# Patient Record
Sex: Female | Born: 1935 | Race: White | Hispanic: No | Smoking: Former smoker
Health system: Southern US, Community
[De-identification: ages and names within clinical notes are randomized; demographics above are authoritative.]

## PROBLEM LIST (undated history)

## (undated) DIAGNOSIS — T8859XA Other complications of anesthesia, initial encounter: Secondary | ICD-10-CM

## (undated) DIAGNOSIS — C3490 Malignant neoplasm of unspecified part of unspecified bronchus or lung: Secondary | ICD-10-CM

## (undated) DIAGNOSIS — M5432 Sciatica, left side: Secondary | ICD-10-CM

## (undated) DIAGNOSIS — C801 Malignant (primary) neoplasm, unspecified: Secondary | ICD-10-CM

## (undated) DIAGNOSIS — R0600 Dyspnea, unspecified: Secondary | ICD-10-CM

## (undated) DIAGNOSIS — I10 Essential (primary) hypertension: Secondary | ICD-10-CM

## (undated) DIAGNOSIS — T4145XA Adverse effect of unspecified anesthetic, initial encounter: Secondary | ICD-10-CM

## (undated) DIAGNOSIS — M48061 Spinal stenosis, lumbar region without neurogenic claudication: Secondary | ICD-10-CM

## (undated) DIAGNOSIS — M810 Age-related osteoporosis without current pathological fracture: Secondary | ICD-10-CM

## (undated) DIAGNOSIS — F329 Major depressive disorder, single episode, unspecified: Secondary | ICD-10-CM

## (undated) DIAGNOSIS — F32A Depression, unspecified: Secondary | ICD-10-CM

## (undated) DIAGNOSIS — D473 Essential (hemorrhagic) thrombocythemia: Secondary | ICD-10-CM

## (undated) DIAGNOSIS — J449 Chronic obstructive pulmonary disease, unspecified: Secondary | ICD-10-CM

## (undated) DIAGNOSIS — E538 Deficiency of other specified B group vitamins: Secondary | ICD-10-CM

## (undated) HISTORY — DX: Chronic obstructive pulmonary disease, unspecified: J44.9

## (undated) HISTORY — DX: Malignant (primary) neoplasm, unspecified: C80.1

## (undated) HISTORY — DX: Essential (hemorrhagic) thrombocythemia: D47.3

## (undated) HISTORY — DX: Spinal stenosis, lumbar region without neurogenic claudication: M48.061

## (undated) HISTORY — DX: Sciatica, left side: M54.32

## (undated) HISTORY — PX: TONSILLECTOMY: SUR1361

## (undated) HISTORY — DX: Major depressive disorder, single episode, unspecified: F32.9

## (undated) HISTORY — PX: STAPEDECTOMY: SHX2435

## (undated) HISTORY — PX: CATARACT EXTRACTION, BILATERAL: SHX1313

## (undated) HISTORY — DX: Depression, unspecified: F32.A

## (undated) HISTORY — PX: APPENDECTOMY: SHX54

## (undated) HISTORY — DX: Age-related osteoporosis without current pathological fracture: M81.0

## (undated) HISTORY — DX: Essential (primary) hypertension: I10

## (undated) HISTORY — DX: Deficiency of other specified B group vitamins: E53.8

---

## 1997-09-05 ENCOUNTER — Ambulatory Visit (HOSPITAL_COMMUNITY): Admission: RE | Admit: 1997-09-05 | Discharge: 1997-09-05 | Payer: Self-pay | Admitting: Ophthalmology

## 1998-03-19 ENCOUNTER — Ambulatory Visit (HOSPITAL_COMMUNITY): Admission: RE | Admit: 1998-03-19 | Discharge: 1998-03-19 | Payer: Self-pay | Admitting: Urology

## 1998-07-25 ENCOUNTER — Other Ambulatory Visit: Admission: RE | Admit: 1998-07-25 | Discharge: 1998-07-25 | Payer: Self-pay | Admitting: Family Medicine

## 1999-01-08 ENCOUNTER — Encounter: Payer: Self-pay | Admitting: Family Medicine

## 1999-01-08 ENCOUNTER — Encounter: Admission: RE | Admit: 1999-01-08 | Discharge: 1999-01-08 | Payer: Self-pay | Admitting: Family Medicine

## 2001-01-12 ENCOUNTER — Encounter: Payer: Self-pay | Admitting: Family Medicine

## 2001-01-12 ENCOUNTER — Encounter: Admission: RE | Admit: 2001-01-12 | Discharge: 2001-01-12 | Payer: Self-pay | Admitting: Family Medicine

## 2002-01-10 ENCOUNTER — Encounter: Admission: RE | Admit: 2002-01-10 | Discharge: 2002-01-10 | Payer: Self-pay | Admitting: Family Medicine

## 2002-01-10 ENCOUNTER — Encounter: Payer: Self-pay | Admitting: Family Medicine

## 2002-01-18 ENCOUNTER — Encounter: Payer: Self-pay | Admitting: Family Medicine

## 2002-01-18 ENCOUNTER — Encounter: Admission: RE | Admit: 2002-01-18 | Discharge: 2002-01-18 | Payer: Self-pay | Admitting: Family Medicine

## 2002-03-25 ENCOUNTER — Other Ambulatory Visit: Admission: RE | Admit: 2002-03-25 | Discharge: 2002-03-25 | Payer: Self-pay | Admitting: Family Medicine

## 2002-10-10 ENCOUNTER — Encounter: Admission: RE | Admit: 2002-10-10 | Discharge: 2002-10-10 | Payer: Self-pay | Admitting: Family Medicine

## 2002-10-10 ENCOUNTER — Encounter: Payer: Self-pay | Admitting: Family Medicine

## 2002-11-07 ENCOUNTER — Ambulatory Visit (HOSPITAL_COMMUNITY): Admission: RE | Admit: 2002-11-07 | Discharge: 2002-11-07 | Payer: Self-pay | Admitting: Gastroenterology

## 2002-11-11 ENCOUNTER — Ambulatory Visit (HOSPITAL_COMMUNITY): Admission: RE | Admit: 2002-11-11 | Discharge: 2002-11-11 | Payer: Self-pay | Admitting: Gastroenterology

## 2002-11-11 ENCOUNTER — Encounter: Payer: Self-pay | Admitting: Gastroenterology

## 2003-01-23 ENCOUNTER — Encounter: Admission: RE | Admit: 2003-01-23 | Discharge: 2003-01-23 | Payer: Self-pay | Admitting: Family Medicine

## 2003-04-17 ENCOUNTER — Other Ambulatory Visit: Admission: RE | Admit: 2003-04-17 | Discharge: 2003-04-17 | Payer: Self-pay | Admitting: Family Medicine

## 2003-06-01 ENCOUNTER — Other Ambulatory Visit: Admission: RE | Admit: 2003-06-01 | Discharge: 2003-06-01 | Payer: Self-pay | Admitting: Family Medicine

## 2004-05-27 ENCOUNTER — Other Ambulatory Visit: Admission: RE | Admit: 2004-05-27 | Discharge: 2004-05-27 | Payer: Self-pay | Admitting: Family Medicine

## 2006-05-13 ENCOUNTER — Encounter: Admission: RE | Admit: 2006-05-13 | Discharge: 2006-05-13 | Payer: Self-pay | Admitting: Family Medicine

## 2006-06-17 ENCOUNTER — Encounter: Admission: RE | Admit: 2006-06-17 | Discharge: 2006-06-17 | Payer: Self-pay | Admitting: Family Medicine

## 2006-06-17 ENCOUNTER — Other Ambulatory Visit: Admission: RE | Admit: 2006-06-17 | Discharge: 2006-06-17 | Payer: Self-pay | Admitting: Family Medicine

## 2006-06-29 ENCOUNTER — Ambulatory Visit (HOSPITAL_COMMUNITY): Admission: RE | Admit: 2006-06-29 | Discharge: 2006-06-29 | Payer: Self-pay | Admitting: Family Medicine

## 2009-02-14 ENCOUNTER — Encounter: Admission: RE | Admit: 2009-02-14 | Discharge: 2009-02-14 | Payer: Self-pay | Admitting: Family Medicine

## 2009-06-05 ENCOUNTER — Encounter: Admission: RE | Admit: 2009-06-05 | Discharge: 2009-06-05 | Payer: Self-pay | Admitting: Family Medicine

## 2010-06-14 NOTE — Op Note (Signed)
   NAME:  Kathy Pierce, Kathy Pierce                           ACCOUNT NO.:  [id number]   MEDICAL RECORD NO.:  [id number]                   PATIENT TYPE:  AMB   LOCATION:  ENDO                                 FACILITY:  [facility]   PHYSICIAN:  John C. Madilyn Fireman, M.D.                 DATE OF BIRTH:  28-Jul-1925   DATE OF PROCEDURE:  11/07/2002  DATE OF DISCHARGE:                                 OPERATIVE REPORT   PROCEDURE PERFORMED:  Esophagogastroduodenoscopy.   ENDOSCOPIST:  Barrie Folk, M.D.   INDICATIONS FOR PROCEDURE:  Right upper quadrant abdominal pain in a patient  with a family history of stomach cancer in a first degree relative.   DESCRIPTION OF PROCEDURE:  The patient was placed in the left lateral  decubitus position and placed on the pulse monitor with continuous low-flow  oxygen delivered by nasal cannula.  She was sedated with 40 mcg IV fentanyl  and 4 mg of IV Versed.  The Olympus video colonoscope was  advanced under  direct vision into the oropharynx and esophagus.  The esophagus was straight  and of normal caliber with the squamocolumnar line at 38 cm.  There was no  visible hiatal hernia, ring, stricture or other abnormality of the  gastroesophageal junction.  The stomach was entered and a small amount of  liquid secretions was suctioned from the fundus.  A retroflex view of the  cardia was unremarkable.  The fundus, body, antrum and pylorus all appeared  normal.  The duodenum was entered and both the bulb and second portion were  well inspected and appeared to be within normal limits.  The scope was then  withdrawn back into the stomach a CLO test obtained.  The scope was then  withdrawn and the patient was returned to the recovery room in stable  condition.  The patient tolerated the procedure well.  There were no  immediate complications.   IMPRESSION:  Normal endoscopy.   PLAN:  Await CLO test and will proceed with colonoscopy.     John C. Madilyn Fireman, M.D.    [facility]/MEDQ  D:  11/07/2002  T:  11/07/2002  Job:  [id number]   cc:   Duncan Dull, M.D.  [address]  [place]  [place] [address]  Fax: [phone]

## 2010-06-14 NOTE — Op Note (Signed)
   NAME:  Kathy Pierce, Kathy Pierce                           ACCOUNT NO.:  [id number]   MEDICAL RECORD NO.:  [id number]                   PATIENT TYPE:  AMB   LOCATION:  ENDO                                 FACILITY:  [facility]   PHYSICIAN:  John C. Madilyn Fireman, M.D.                 DATE OF BIRTH:  11/07/1925   DATE OF PROCEDURE:  11/07/2002  DATE OF DISCHARGE:                                 OPERATIVE REPORT   PROCEDURE:  Colonoscopy.   INDICATIONS FOR PROCEDURE:  Colon cancer screening in a patient who has also  under evaluation for right upper quadrant abdominal pain.   DESCRIPTION OF PROCEDURE:  The patient was placed in the left lateral  decubitus position and placed on the pulse monitor with continuous low-flow  oxygen delivered by nasal cannula.  She was sedated with 30 mcg IV fentanyl,  3 mg IV Versed in addition to the medicine given for the previous EGD.  The  Olympus video colonoscope was inserted into the rectum and advanced to the  cecum, confirmed by transillumination of McBurney's point and visualization  of the ileocecal valve and appendiceal orifice.  Prep was excellent.  The  cecum, ascending, transverse, descending, and sigmoid colon all appeared  normal with no masses, polyps, diverticula, or other mucosal abnormalities.  The rectum likewise appeared normal and retroflexed view of the anus  revealed no obvious internal hemorrhoids.  The colonoscope was then  withdrawn and the patient returned to the recovery room in stable condition.  She tolerated the procedure well and there were no immediate complications.   IMPRESSION:  Normal colonoscopy.   PLAN:  Will consider gallbladder ultrasound or possibly CT scan of the  abdomen and pelvis.                                                John C. Madilyn Fireman, M.D.    J[facility]/MEDQ  D:  11/07/2002  T:  11/07/2002  Job:  [id number]   cc:   Duncan Dull, M.D.  [address]  [place]  [place] [address]  Fax: [phone]

## 2011-02-26 ENCOUNTER — Other Ambulatory Visit: Payer: Self-pay | Admitting: Family Medicine

## 2011-03-24 ENCOUNTER — Ambulatory Visit
Admission: RE | Admit: 2011-03-24 | Discharge: 2011-03-24 | Disposition: A | Discharge status: Home / Self Care | Payer: Self-pay | Source: Ambulatory Visit | Attending: Family Medicine | Admitting: Family Medicine

## 2011-11-05 ENCOUNTER — Other Ambulatory Visit: Payer: Self-pay | Admitting: Family Medicine

## 2011-11-05 ENCOUNTER — Ambulatory Visit
Admission: RE | Admit: 2011-11-05 | Discharge: 2011-11-05 | Disposition: A | Discharge status: Home / Self Care | Payer: Medicare Other | Source: Ambulatory Visit | Attending: Family Medicine | Admitting: Family Medicine

## 2011-11-05 DIAGNOSIS — R2689 Other abnormalities of gait and mobility: Secondary | ICD-10-CM

## 2011-11-19 ENCOUNTER — Other Ambulatory Visit: Payer: Self-pay | Admitting: Neurology

## 2011-11-19 DIAGNOSIS — M48061 Spinal stenosis, lumbar region without neurogenic claudication: Secondary | ICD-10-CM

## 2011-11-24 ENCOUNTER — Ambulatory Visit
Admission: RE | Admit: 2011-11-24 | Discharge: 2011-11-24 | Disposition: A | Discharge status: Home / Self Care | Payer: Medicare Other | Source: Ambulatory Visit | Attending: Neurology | Admitting: Neurology

## 2011-11-24 DIAGNOSIS — M48061 Spinal stenosis, lumbar region without neurogenic claudication: Secondary | ICD-10-CM

## 2011-11-24 MED ORDER — IOHEXOL 180 MG/ML  SOLN
1.0000 mL | Freq: Once | INTRAMUSCULAR | Status: AC | PRN
  Administered 2011-11-24: 1 mL via EPIDURAL

## 2011-11-24 MED ORDER — METHYLPREDNISOLONE ACETATE 40 MG/ML INJ SUSP (RADIOLOG
120.0000 mg | Freq: Once | INTRAMUSCULAR | Status: AC
  Administered 2011-11-24: 120 mg via EPIDURAL

## 2011-12-09 ENCOUNTER — Other Ambulatory Visit: Payer: Self-pay | Admitting: Neurology

## 2011-12-09 DIAGNOSIS — M48061 Spinal stenosis, lumbar region without neurogenic claudication: Secondary | ICD-10-CM

## 2011-12-10 ENCOUNTER — Ambulatory Visit
Admission: RE | Admit: 2011-12-10 | Discharge: 2011-12-10 | Disposition: A | Discharge status: Home / Self Care | Payer: Medicare Other | Source: Ambulatory Visit | Attending: Neurology | Admitting: Neurology

## 2011-12-10 DIAGNOSIS — M48061 Spinal stenosis, lumbar region without neurogenic claudication: Secondary | ICD-10-CM

## 2011-12-10 MED ORDER — METHYLPREDNISOLONE ACETATE 40 MG/ML INJ SUSP (RADIOLOG
120.0000 mg | Freq: Once | INTRAMUSCULAR | Status: AC
  Administered 2011-12-10: 120 mg via EPIDURAL

## 2011-12-10 MED ORDER — IOHEXOL 180 MG/ML  SOLN
1.0000 mL | Freq: Once | INTRAMUSCULAR | Status: AC | PRN
  Administered 2011-12-10: 1 mL via EPIDURAL

## 2012-04-06 ENCOUNTER — Encounter: Payer: Self-pay | Admitting: Cardiovascular Disease

## 2012-05-21 ENCOUNTER — Encounter: Payer: Self-pay | Admitting: Cardiovascular Disease

## 2012-06-16 ENCOUNTER — Encounter: Payer: Self-pay | Admitting: Neurology

## 2012-06-16 ENCOUNTER — Ambulatory Visit (INDEPENDENT_AMBULATORY_CARE_PROVIDER_SITE_OTHER): Payer: Medicare Other | Admitting: Neurology

## 2012-06-16 VITALS — BP 134/68 | HR 66 | Ht 63.75 in | Wt 110.0 lb

## 2012-06-16 DIAGNOSIS — M5432 Sciatica, left side: Secondary | ICD-10-CM

## 2012-06-16 DIAGNOSIS — M5430 Sciatica, unspecified side: Secondary | ICD-10-CM

## 2012-06-16 DIAGNOSIS — M48061 Spinal stenosis, lumbar region without neurogenic claudication: Secondary | ICD-10-CM

## 2012-06-16 HISTORY — DX: Spinal stenosis, lumbar region without neurogenic claudication: M48.061

## 2012-06-16 HISTORY — DX: Sciatica, left side: M54.32

## 2012-06-16 NOTE — Progress Notes (Signed)
Reason for visit: Leg pain  Kathy Pierce is an 79 y.o. female  History of present illness:  Kathy Pierce is an 79 year old right-handed white female with a history of lumbosacral spinal stenosis at the L4-5 level also associated with some mild gait instability. The patient has done quite well since she was seen through this office in the fall of 2013. Within the last several weeks, the patient developed significant pain into the left hip and down the left leg to the foot. The patient has difficulty with sitting. If the patient walks too long, she will have increased pain, but the patient indicates that she hurts all of the time. The patient is now using a cane for ambulation in the morning when the pain is particularly bad. The patient has been given a course of prednisone without much benefit. The patient is sent to this office for an evaluation  Past Medical History  Diagnosis Date  . Hypertension   . Sciatica of left side 06/16/2012  . Spinal stenosis of lumbar region 06/16/2012  . COPD (chronic obstructive pulmonary disease)   . Osteoporosis   . Depression   . Vitamin B12 deficiency   . Cancer     Squamous cell carcinoma, skin, basal cell carcinoma  . Essential thrombocythemia     Past Surgical History  Procedure Laterality Date  . Tonsillectomy    . Appendectomy    . Cataract extraction, bilateral Bilateral   . Stapedectomy Right     Family History  Problem Relation Age of Onset  . Heart attack Father   . Cancer Mother     Social history:  reports that she quit smoking about 35 years ago. She does not have any smokeless tobacco history on file. She reports that she does not drink alcohol. Her drug history is not on file.  Allergies:  Allergies  Allergen Reactions  . Codeine Nausea And Vomiting  . Erythromycin Nausea And Vomiting  . Penicillins Rash  . Sulfa Drugs Cross Reactors Nausea And Vomiting    Medications:  Current Outpatient Prescriptions on File Prior to  Visit  Medication Sig Dispense Refill  . aspirin 81 MG tablet Take 81 mg by mouth daily.      . calcium carbonate (OS-CAL - DOSED IN MG OF ELEMENTAL CALCIUM) 1250 MG tablet Take 1 tablet by mouth daily.      . Cyanocobalamin (VITAMIN B 12 PO) Take by mouth.      . hydroxyurea (HYDREA) 500 MG capsule Take 500 mg by mouth every other day. May take with food to minimize GI side effects.       No current facility-administered medications on file prior to visit.    ROS:  Out of a complete 14 system review of symptoms, the patient complains only of the following symptoms, and all other reviewed systems are negative.  Easy bruising Leg pain Gait instability  Blood pressure 134/68, pulse 66, height 5' 3.75" (1.619 m), weight 110 lb (49.896 kg).  Physical Exam  General: The patient is alert and cooperative at the time of the examination.  Skin: No significant peripheral edema is noted, mild swelling of the right foot is seen.   Neurologic Exam  Cranial nerves: Facial symmetry is present. Speech is normal, no aphasia or dysarthria is noted. Extraocular movements are full. Visual fields are full.  Motor: The patient has good strength in all 4 extremities. The patient is able to walk on heels and the toes.  Coordination: The patient  has good finger-nose-finger and heel-to-shin bilaterally.  Gait and station: The patient has a slightly unsteady gait. Tandem gait is unsteady. Romberg is negative. No drift is seen.  Reflexes: Deep tendon reflexes are symmetric.   Assessment/Plan:  One. Lumbosacral spinal stenosis, L4-5 level  The patient has had an exacerbation of pain and discomfort of the left leg. The patient will be set up for an epidural steroid injection of the lumbosacral spine. The patient will contact our office if he continues to have discomfort.  Marlan Palau MD 06/16/2012 7:15 PM  [facility] [address] [place], [place]  [address]  Phone [phone] Fax [phone]

## 2012-06-23 ENCOUNTER — Telehealth: Payer: Self-pay | Admitting: Neurology

## 2012-06-23 NOTE — Telephone Encounter (Signed)
Patient is calling to let us know, she has not heard from anyone at [facility] regarding her injection/procedure.  Please give the patient a call back asap to let her know what's going on with her being scheduled.  She states she doesn't know where she will be having the injection.

## 2012-06-23 NOTE — Telephone Encounter (Signed)
I called pt and LMVM for her that did receive her message and referral, Kathy Pierce was given message about this.

## 2012-06-24 ENCOUNTER — Other Ambulatory Visit: Payer: Self-pay | Admitting: Neurology

## 2012-06-24 DIAGNOSIS — M48061 Spinal stenosis, lumbar region without neurogenic claudication: Secondary | ICD-10-CM

## 2012-06-29 ENCOUNTER — Other Ambulatory Visit: Payer: Self-pay | Admitting: Neurology

## 2012-06-29 ENCOUNTER — Ambulatory Visit
Admission: RE | Admit: 2012-06-29 | Discharge: 2012-06-29 | Disposition: A | Discharge status: Home / Self Care | Payer: Medicare Other | Source: Ambulatory Visit | Attending: Neurology | Admitting: Neurology

## 2012-06-29 DIAGNOSIS — M48061 Spinal stenosis, lumbar region without neurogenic claudication: Secondary | ICD-10-CM

## 2012-06-29 MED ORDER — IOHEXOL 180 MG/ML  SOLN
1.0000 mL | Freq: Once | INTRAMUSCULAR | Status: AC | PRN
  Administered 2012-06-29: 1 mL via EPIDURAL

## 2012-06-29 MED ORDER — METHYLPREDNISOLONE ACETATE 40 MG/ML INJ SUSP (RADIOLOG
120.0000 mg | Freq: Once | INTRAMUSCULAR | Status: AC
  Administered 2012-06-29: 120 mg via EPIDURAL

## 2012-07-08 ENCOUNTER — Telehealth: Payer: Self-pay | Admitting: Neurology

## 2012-07-08 NOTE — Telephone Encounter (Signed)
Noted  

## 2012-07-08 NOTE — Telephone Encounter (Signed)
Pt wanted Dr. Anne Hahn to know she had her steroid shot on 06/29/12 and she is doing much better. Pain has not completely went away,  but takes Tylenol as needed which alleviates.

## 2012-07-13 ENCOUNTER — Telehealth: Payer: Self-pay | Admitting: *Deleted

## 2012-07-13 DIAGNOSIS — M5431 Sciatica, right side: Secondary | ICD-10-CM

## 2012-07-13 NOTE — Telephone Encounter (Signed)
Message copied by Monico Blitz on Tue Jul 13, 2012  4:12 PM ------      Message from: Anice Paganini      Created: Tue Jul 13, 2012  3:17 PM      Contact: Pt Kathy Pierce       Pt called last week that she was getting better in her hips/legs but starting yesterday she started getting worse. She did have an infusion a couple of weeks ago. Pt would like a call back. Thanks  ------

## 2012-07-13 NOTE — Telephone Encounter (Signed)
I called patient. The patient got good relief from the epidural steroid injection, but the benefit only lasted about 2 weeks. I'll try another epidural.

## 2012-07-16 ENCOUNTER — Other Ambulatory Visit: Payer: Self-pay | Admitting: Neurology

## 2012-07-16 DIAGNOSIS — M549 Dorsalgia, unspecified: Secondary | ICD-10-CM

## 2012-07-19 ENCOUNTER — Ambulatory Visit
Admission: RE | Admit: 2012-07-19 | Discharge: 2012-07-19 | Disposition: A | Discharge status: Home / Self Care | Payer: Medicare Other | Source: Ambulatory Visit | Attending: Neurology | Admitting: Neurology

## 2012-07-19 ENCOUNTER — Other Ambulatory Visit: Payer: Self-pay | Admitting: Neurology

## 2012-07-19 ENCOUNTER — Telehealth: Payer: Self-pay

## 2012-07-19 DIAGNOSIS — M549 Dorsalgia, unspecified: Secondary | ICD-10-CM

## 2012-07-19 MED ORDER — IOHEXOL 180 MG/ML  SOLN
1.0000 mL | Freq: Once | INTRAMUSCULAR | Status: AC | PRN
  Administered 2012-07-19: 1 mL via EPIDURAL

## 2012-07-19 MED ORDER — METHYLPREDNISOLONE ACETATE 40 MG/ML INJ SUSP (RADIOLOG
120.0000 mg | Freq: Once | INTRAMUSCULAR | Status: AC
  Administered 2012-07-19: 120 mg via EPIDURAL

## 2012-07-19 NOTE — Telephone Encounter (Signed)
Patient called and left message that she wants to know if Dr. Anne Hahn has ordered new steroid injections for patient and when should she come in for that. I called her back and left a VM that I do not see an order. I will forward her message to Dr. Anne Hahn but, she needs to know he is on vacation and will not get the message before next week. Dr. Anne Hahn, Please advise. Thank you

## 2012-07-19 NOTE — Telephone Encounter (Signed)
Message copied by [facility] on Mon Jul 19, 2012  9:44 AM ------      Message from: [facility] [place]      Created: Thu Jul 15, 2012  8:53 AM      Contact: pt       Pt is calling and she wants to know if Dr. Anne Hahn has ordered her another steroid injection.  If so when will she get it.  Please call and advise @  [phone] or cell [phone]. ------

## 2012-07-20 NOTE — Telephone Encounter (Signed)
I called patient. I left a message. The patient just got an epidural steroid injection on June 20. If she requires a third injection, I will get this set up. She will contact our office if this is the case.

## 2013-03-21 ENCOUNTER — Other Ambulatory Visit: Payer: Self-pay | Admitting: Family Medicine

## 2013-03-21 ENCOUNTER — Ambulatory Visit
Admission: RE | Admit: 2013-03-21 | Discharge: 2013-03-21 | Disposition: A | Discharge status: Home / Self Care | Payer: Medicare Other | Source: Ambulatory Visit | Attending: Family Medicine | Admitting: Family Medicine

## 2013-03-21 DIAGNOSIS — R0602 Shortness of breath: Secondary | ICD-10-CM

## 2013-04-19 ENCOUNTER — Other Ambulatory Visit: Payer: Self-pay | Admitting: Family Medicine

## 2013-04-19 DIAGNOSIS — R599 Enlarged lymph nodes, unspecified: Secondary | ICD-10-CM

## 2013-04-19 DIAGNOSIS — R591 Generalized enlarged lymph nodes: Secondary | ICD-10-CM

## 2013-04-22 ENCOUNTER — Ambulatory Visit
Admission: RE | Admit: 2013-04-22 | Discharge: 2013-04-22 | Disposition: A | Discharge status: Home / Self Care | Payer: Medicare Other | Source: Ambulatory Visit | Attending: Family Medicine | Admitting: Family Medicine

## 2013-04-22 DIAGNOSIS — R591 Generalized enlarged lymph nodes: Secondary | ICD-10-CM

## 2013-04-22 DIAGNOSIS — R599 Enlarged lymph nodes, unspecified: Secondary | ICD-10-CM

## 2013-04-25 ENCOUNTER — Other Ambulatory Visit: Payer: Self-pay | Admitting: Family Medicine

## 2013-04-25 DIAGNOSIS — R0602 Shortness of breath: Secondary | ICD-10-CM

## 2013-04-26 ENCOUNTER — Ambulatory Visit (INDEPENDENT_AMBULATORY_CARE_PROVIDER_SITE_OTHER): Payer: Medicare Other | Admitting: Internal Medicine

## 2013-04-26 DIAGNOSIS — R0602 Shortness of breath: Secondary | ICD-10-CM

## 2013-04-26 LAB — PULMONARY FUNCTION TEST
DL/VA % PRED: 70 %
DL/VA: 3.39 ml/min/mmHg/L
DLCO UNC: 10.42 ml/min/mmHg
DLCO unc % pred: 42 %
FEF 25-75 POST: 0.54 L/s
FEF 25-75 PRE: 0.35 L/s
FEF2575-%Change-Post: 54 %
FEF2575-%PRED-PRE: 30 %
FEF2575-%Pred-Post: 47 %
FEV1-%Change-Post: 17 %
FEV1-%PRED-PRE: 52 %
FEV1-%Pred-Post: 61 %
FEV1-POST: 1.08 L
FEV1-Pre: 0.92 L
FEV1FVC-%CHANGE-POST: 9 %
FEV1FVC-%Pred-Pre: 72 %
FEV6-%CHANGE-POST: 8 %
FEV6-%PRED-POST: 81 %
FEV6-%PRED-PRE: 74 %
FEV6-Post: 1.81 L
FEV6-Pre: 1.67 L
FEV6FVC-%Change-Post: 2 %
FEV6FVC-%PRED-POST: 103 %
FEV6FVC-%Pred-Pre: 100 %
FVC-%CHANGE-POST: 7 %
FVC-%PRED-POST: 79 %
FVC-%Pred-Pre: 73 %
FVC-POST: 1.88 L
FVC-Pre: 1.76 L
POST FEV1/FVC RATIO: 57 %
PRE FEV1/FVC RATIO: 53 %
Post FEV6/FVC ratio: 97 %
Pre FEV6/FVC Ratio: 95 %
RV % pred: 117 %
RV: 2.94 L
TLC % PRED: 95 %
TLC: 4.85 L

## 2013-04-26 NOTE — Progress Notes (Signed)
PFT done today. 

## 2013-05-02 ENCOUNTER — Other Ambulatory Visit: Payer: Self-pay | Admitting: Otolaryngology

## 2013-05-02 DIAGNOSIS — R221 Localized swelling, mass and lump, neck: Principal | ICD-10-CM

## 2013-05-02 DIAGNOSIS — R220 Localized swelling, mass and lump, head: Secondary | ICD-10-CM

## 2013-05-05 ENCOUNTER — Ambulatory Visit
Admission: RE | Admit: 2013-05-05 | Discharge: 2013-05-05 | Disposition: A | Discharge status: Home / Self Care | Payer: Medicare Other | Source: Ambulatory Visit | Attending: Otolaryngology | Admitting: Otolaryngology

## 2013-05-05 DIAGNOSIS — R221 Localized swelling, mass and lump, neck: Principal | ICD-10-CM

## 2013-05-05 DIAGNOSIS — R220 Localized swelling, mass and lump, head: Secondary | ICD-10-CM

## 2013-05-05 MED ORDER — IOHEXOL 300 MG/ML  SOLN
75.0000 mL | Freq: Once | INTRAMUSCULAR | Status: AC | PRN
  Administered 2013-05-05: 75 mL via INTRAVENOUS

## 2013-05-17 ENCOUNTER — Other Ambulatory Visit (HOSPITAL_COMMUNITY): Payer: Self-pay

## 2013-05-17 DIAGNOSIS — J441 Chronic obstructive pulmonary disease with (acute) exacerbation: Secondary | ICD-10-CM

## 2013-05-24 ENCOUNTER — Inpatient Hospital Stay (HOSPITAL_COMMUNITY)
Admission: RE | Admit: 2013-05-24 | Discharge: 2013-05-24 | Disposition: A | Discharge status: Home / Self Care | Payer: Medicare Other | Source: Ambulatory Visit

## 2013-05-24 ENCOUNTER — Ambulatory Visit (HOSPITAL_COMMUNITY)
Admission: RE | Admit: 2013-05-24 | Discharge: 2013-05-24 | Disposition: A | Discharge status: Home / Self Care | Payer: Medicare Other | Source: Ambulatory Visit | Attending: Family Medicine | Admitting: Family Medicine

## 2013-05-24 DIAGNOSIS — J441 Chronic obstructive pulmonary disease with (acute) exacerbation: Secondary | ICD-10-CM | POA: Insufficient documentation

## 2013-05-24 MED ORDER — ALBUTEROL SULFATE (2.5 MG/3ML) 0.083% IN NEBU
2.5000 mg | INHALATION_SOLUTION | Freq: Once | RESPIRATORY_TRACT | Status: AC
  Administered 2013-05-24: 2.5 mg via RESPIRATORY_TRACT

## 2013-05-30 LAB — PULMONARY FUNCTION TEST
DL/VA % pred: 44 %
DL/VA: 2.16 ml/min/mmHg/L
DLCO UNC % PRED: 23 %
DLCO unc: 5.72 ml/min/mmHg
FEF 25-75 POST: 0.41 L/s
FEF 25-75 PRE: 0.41 L/s
FEF2575-%Change-Post: 0 %
FEF2575-%PRED-POST: 36 %
FEF2575-%Pred-Pre: 36 %
FEV1-%CHANGE-POST: 0 %
FEV1-%PRED-POST: 55 %
FEV1-%PRED-PRE: 54 %
FEV1-POST: 0.97 L
FEV1-Pre: 0.96 L
FEV1FVC-%Change-Post: 0 %
FEV1FVC-%PRED-PRE: 76 %
FEV6-%Change-Post: 0 %
FEV6-%PRED-POST: 74 %
FEV6-%PRED-PRE: 74 %
FEV6-POST: 1.66 L
FEV6-PRE: 1.67 L
FEV6FVC-%CHANGE-POST: -1 %
FEV6FVC-%Pred-Post: 102 %
FEV6FVC-%Pred-Pre: 104 %
FVC-%Change-Post: 0 %
FVC-%PRED-POST: 72 %
FVC-%Pred-Pre: 72 %
FVC-PRE: 1.71 L
FVC-Post: 1.72 L
PRE FEV1/FVC RATIO: 56 %
Post FEV1/FVC ratio: 56 %
Post FEV6/FVC ratio: 96 %
Pre FEV6/FVC Ratio: 98 %
RV % pred: 117 %
RV: 2.93 L
TLC % pred: 89 %
TLC: 4.52 L

## 2013-10-06 ENCOUNTER — Other Ambulatory Visit: Payer: Self-pay | Admitting: Family Medicine

## 2013-10-06 DIAGNOSIS — M810 Age-related osteoporosis without current pathological fracture: Secondary | ICD-10-CM

## 2013-10-24 ENCOUNTER — Ambulatory Visit
Admission: RE | Admit: 2013-10-24 | Discharge: 2013-10-24 | Disposition: A | Discharge status: Home / Self Care | Payer: Medicare Other | Source: Ambulatory Visit | Attending: Family Medicine | Admitting: Family Medicine

## 2013-10-24 DIAGNOSIS — M810 Age-related osteoporosis without current pathological fracture: Secondary | ICD-10-CM

## 2014-10-23 ENCOUNTER — Other Ambulatory Visit: Payer: Self-pay | Admitting: Family Medicine

## 2014-10-23 DIAGNOSIS — M79605 Pain in left leg: Secondary | ICD-10-CM

## 2014-10-23 DIAGNOSIS — M79604 Pain in right leg: Secondary | ICD-10-CM

## 2014-10-25 ENCOUNTER — Ambulatory Visit
Admission: RE | Admit: 2014-10-25 | Discharge: 2014-10-25 | Disposition: A | Discharge status: Home / Self Care | Payer: Medicare Other | Source: Ambulatory Visit | Attending: Family Medicine | Admitting: Family Medicine

## 2014-10-25 DIAGNOSIS — M79605 Pain in left leg: Secondary | ICD-10-CM

## 2014-10-25 DIAGNOSIS — M79604 Pain in right leg: Secondary | ICD-10-CM

## 2015-03-09 DIAGNOSIS — H40013 Open angle with borderline findings, low risk, bilateral: Secondary | ICD-10-CM | POA: Diagnosis not present

## 2015-03-09 DIAGNOSIS — H353132 Nonexudative age-related macular degeneration, bilateral, intermediate dry stage: Secondary | ICD-10-CM | POA: Diagnosis not present

## 2015-03-09 DIAGNOSIS — Z961 Presence of intraocular lens: Secondary | ICD-10-CM | POA: Diagnosis not present

## 2015-03-09 DIAGNOSIS — H53452 Other localized visual field defect, left eye: Secondary | ICD-10-CM | POA: Diagnosis not present

## 2015-03-14 DIAGNOSIS — L821 Other seborrheic keratosis: Secondary | ICD-10-CM | POA: Diagnosis not present

## 2015-03-14 DIAGNOSIS — Z85828 Personal history of other malignant neoplasm of skin: Secondary | ICD-10-CM | POA: Diagnosis not present

## 2015-03-14 DIAGNOSIS — D485 Neoplasm of uncertain behavior of skin: Secondary | ICD-10-CM | POA: Diagnosis not present

## 2015-03-14 DIAGNOSIS — L570 Actinic keratosis: Secondary | ICD-10-CM | POA: Diagnosis not present

## 2015-03-14 DIAGNOSIS — Z23 Encounter for immunization: Secondary | ICD-10-CM | POA: Diagnosis not present

## 2015-03-14 DIAGNOSIS — D0472 Carcinoma in situ of skin of left lower limb, including hip: Secondary | ICD-10-CM | POA: Diagnosis not present

## 2015-03-14 DIAGNOSIS — C44722 Squamous cell carcinoma of skin of right lower limb, including hip: Secondary | ICD-10-CM | POA: Diagnosis not present

## 2015-03-27 DIAGNOSIS — L089 Local infection of the skin and subcutaneous tissue, unspecified: Secondary | ICD-10-CM | POA: Diagnosis not present

## 2015-04-10 DIAGNOSIS — H35373 Puckering of macula, bilateral: Secondary | ICD-10-CM | POA: Diagnosis not present

## 2015-04-10 DIAGNOSIS — H353132 Nonexudative age-related macular degeneration, bilateral, intermediate dry stage: Secondary | ICD-10-CM | POA: Diagnosis not present

## 2015-04-10 DIAGNOSIS — H43813 Vitreous degeneration, bilateral: Secondary | ICD-10-CM | POA: Diagnosis not present

## 2015-04-17 DIAGNOSIS — C44722 Squamous cell carcinoma of skin of right lower limb, including hip: Secondary | ICD-10-CM | POA: Diagnosis not present

## 2015-04-17 DIAGNOSIS — C44729 Squamous cell carcinoma of skin of left lower limb, including hip: Secondary | ICD-10-CM | POA: Diagnosis not present

## 2015-04-17 DIAGNOSIS — D485 Neoplasm of uncertain behavior of skin: Secondary | ICD-10-CM | POA: Diagnosis not present

## 2015-04-19 DIAGNOSIS — J449 Chronic obstructive pulmonary disease, unspecified: Secondary | ICD-10-CM | POA: Diagnosis not present

## 2015-04-19 DIAGNOSIS — H6121 Impacted cerumen, right ear: Secondary | ICD-10-CM | POA: Diagnosis not present

## 2015-04-19 DIAGNOSIS — I10 Essential (primary) hypertension: Secondary | ICD-10-CM | POA: Diagnosis not present

## 2015-04-19 DIAGNOSIS — R05 Cough: Secondary | ICD-10-CM | POA: Diagnosis not present

## 2015-04-19 DIAGNOSIS — F322 Major depressive disorder, single episode, severe without psychotic features: Secondary | ICD-10-CM | POA: Diagnosis not present

## 2015-04-19 DIAGNOSIS — D473 Essential (hemorrhagic) thrombocythemia: Secondary | ICD-10-CM | POA: Diagnosis not present

## 2015-04-19 DIAGNOSIS — N390 Urinary tract infection, site not specified: Secondary | ICD-10-CM | POA: Diagnosis not present

## 2015-04-19 DIAGNOSIS — M810 Age-related osteoporosis without current pathological fracture: Secondary | ICD-10-CM | POA: Diagnosis not present

## 2015-04-19 DIAGNOSIS — E538 Deficiency of other specified B group vitamins: Secondary | ICD-10-CM | POA: Diagnosis not present

## 2015-04-19 DIAGNOSIS — M25551 Pain in right hip: Secondary | ICD-10-CM | POA: Diagnosis not present

## 2015-04-25 DIAGNOSIS — H6121 Impacted cerumen, right ear: Secondary | ICD-10-CM | POA: Diagnosis not present

## 2015-04-25 DIAGNOSIS — H6120 Impacted cerumen, unspecified ear: Secondary | ICD-10-CM | POA: Insufficient documentation

## 2015-04-25 DIAGNOSIS — H9011 Conductive hearing loss, unilateral, right ear, with unrestricted hearing on the contralateral side: Secondary | ICD-10-CM | POA: Diagnosis not present

## 2015-05-01 DIAGNOSIS — S81801A Unspecified open wound, right lower leg, initial encounter: Secondary | ICD-10-CM | POA: Diagnosis not present

## 2015-05-08 DIAGNOSIS — S81801D Unspecified open wound, right lower leg, subsequent encounter: Secondary | ICD-10-CM | POA: Diagnosis not present

## 2015-05-08 DIAGNOSIS — S81802D Unspecified open wound, left lower leg, subsequent encounter: Secondary | ICD-10-CM | POA: Diagnosis not present

## 2015-05-09 DIAGNOSIS — H01005 Unspecified blepharitis left lower eyelid: Secondary | ICD-10-CM | POA: Diagnosis not present

## 2015-05-09 DIAGNOSIS — H353132 Nonexudative age-related macular degeneration, bilateral, intermediate dry stage: Secondary | ICD-10-CM | POA: Diagnosis not present

## 2015-05-09 DIAGNOSIS — H01004 Unspecified blepharitis left upper eyelid: Secondary | ICD-10-CM | POA: Diagnosis not present

## 2015-05-09 DIAGNOSIS — H01002 Unspecified blepharitis right lower eyelid: Secondary | ICD-10-CM | POA: Diagnosis not present

## 2015-05-09 DIAGNOSIS — H01001 Unspecified blepharitis right upper eyelid: Secondary | ICD-10-CM | POA: Diagnosis not present

## 2015-05-09 DIAGNOSIS — H53452 Other localized visual field defect, left eye: Secondary | ICD-10-CM | POA: Diagnosis not present

## 2015-05-09 DIAGNOSIS — H40013 Open angle with borderline findings, low risk, bilateral: Secondary | ICD-10-CM | POA: Diagnosis not present

## 2015-05-15 DIAGNOSIS — L929 Granulomatous disorder of the skin and subcutaneous tissue, unspecified: Secondary | ICD-10-CM | POA: Diagnosis not present

## 2015-05-18 DIAGNOSIS — C44729 Squamous cell carcinoma of skin of left lower limb, including hip: Secondary | ICD-10-CM | POA: Diagnosis not present

## 2015-05-18 DIAGNOSIS — C44722 Squamous cell carcinoma of skin of right lower limb, including hip: Secondary | ICD-10-CM | POA: Diagnosis not present

## 2015-05-18 DIAGNOSIS — D485 Neoplasm of uncertain behavior of skin: Secondary | ICD-10-CM | POA: Diagnosis not present

## 2015-05-31 ENCOUNTER — Telehealth: Payer: Self-pay | Admitting: Neurology

## 2015-05-31 ENCOUNTER — Other Ambulatory Visit: Payer: Self-pay | Admitting: Family Medicine

## 2015-05-31 DIAGNOSIS — M5416 Radiculopathy, lumbar region: Secondary | ICD-10-CM

## 2015-05-31 NOTE — Telephone Encounter (Signed)
Dr. Inda Merlin called concerning the patient, she has had return of discomfort in the back, she will go ahead and order an epidural steroid injection, the patient has had 2 injections in 2014, these seemed to help. Okay to perform another epidural.

## 2015-06-01 ENCOUNTER — Ambulatory Visit
Admission: RE | Admit: 2015-06-01 | Discharge: 2015-06-01 | Disposition: A | Discharge status: Home / Self Care | Payer: Medicare Other | Source: Ambulatory Visit | Attending: Family Medicine | Admitting: Family Medicine

## 2015-06-01 DIAGNOSIS — M5416 Radiculopathy, lumbar region: Secondary | ICD-10-CM

## 2015-06-01 DIAGNOSIS — M47817 Spondylosis without myelopathy or radiculopathy, lumbosacral region: Secondary | ICD-10-CM | POA: Diagnosis not present

## 2015-06-01 MED ORDER — IOPAMIDOL (ISOVUE-M 200) INJECTION 41%
1.0000 mL | Freq: Once | INTRAMUSCULAR | Status: AC
  Administered 2015-06-01: 1 mL via EPIDURAL

## 2015-06-01 MED ORDER — METHYLPREDNISOLONE ACETATE 40 MG/ML INJ SUSP (RADIOLOG
120.0000 mg | Freq: Once | INTRAMUSCULAR | Status: AC
  Administered 2015-06-01: 120 mg via EPIDURAL

## 2015-06-01 NOTE — Discharge Instructions (Signed)

## 2015-06-05 DIAGNOSIS — M545 Low back pain: Secondary | ICD-10-CM | POA: Diagnosis not present

## 2015-06-11 DIAGNOSIS — I10 Essential (primary) hypertension: Secondary | ICD-10-CM | POA: Diagnosis not present

## 2015-06-19 ENCOUNTER — Other Ambulatory Visit: Payer: Self-pay | Admitting: Sports Medicine

## 2015-06-19 DIAGNOSIS — M545 Low back pain: Secondary | ICD-10-CM | POA: Diagnosis not present

## 2015-06-26 ENCOUNTER — Ambulatory Visit
Admission: RE | Admit: 2015-06-26 | Discharge: 2015-06-26 | Disposition: A | Discharge status: Home / Self Care | Payer: Medicare Other | Source: Ambulatory Visit | Attending: Sports Medicine | Admitting: Sports Medicine

## 2015-06-26 DIAGNOSIS — M545 Low back pain: Secondary | ICD-10-CM

## 2015-06-26 DIAGNOSIS — M4806 Spinal stenosis, lumbar region: Secondary | ICD-10-CM | POA: Diagnosis not present

## 2015-06-28 DIAGNOSIS — M47816 Spondylosis without myelopathy or radiculopathy, lumbar region: Secondary | ICD-10-CM | POA: Diagnosis not present

## 2015-07-02 DIAGNOSIS — M545 Low back pain: Secondary | ICD-10-CM | POA: Diagnosis not present

## 2015-07-02 DIAGNOSIS — M5416 Radiculopathy, lumbar region: Secondary | ICD-10-CM | POA: Diagnosis not present

## 2015-07-02 DIAGNOSIS — M47816 Spondylosis without myelopathy or radiculopathy, lumbar region: Secondary | ICD-10-CM | POA: Diagnosis not present

## 2015-07-10 DIAGNOSIS — C44729 Squamous cell carcinoma of skin of left lower limb, including hip: Secondary | ICD-10-CM | POA: Diagnosis not present

## 2015-07-18 DIAGNOSIS — M5416 Radiculopathy, lumbar region: Secondary | ICD-10-CM | POA: Diagnosis not present

## 2015-07-24 DIAGNOSIS — I10 Essential (primary) hypertension: Secondary | ICD-10-CM | POA: Diagnosis not present

## 2015-07-24 DIAGNOSIS — M7130 Other bursal cyst, unspecified site: Secondary | ICD-10-CM | POA: Diagnosis not present

## 2015-07-24 DIAGNOSIS — M4806 Spinal stenosis, lumbar region: Secondary | ICD-10-CM | POA: Diagnosis not present

## 2015-07-25 DIAGNOSIS — M7989 Other specified soft tissue disorders: Secondary | ICD-10-CM | POA: Diagnosis not present

## 2015-08-03 ENCOUNTER — Other Ambulatory Visit: Payer: Self-pay | Admitting: Neurological Surgery

## 2015-08-07 ENCOUNTER — Encounter (HOSPITAL_COMMUNITY)
Admission: RE | Admit: 2015-08-07 | Discharge: 2015-08-07 | Disposition: A | Discharge status: Home / Self Care | Payer: Medicare Other | Source: Ambulatory Visit | Attending: Neurological Surgery | Admitting: Neurological Surgery

## 2015-08-07 ENCOUNTER — Ambulatory Visit (HOSPITAL_COMMUNITY)
Admission: RE | Admit: 2015-08-07 | Discharge: 2015-08-07 | Disposition: A | Discharge status: Home / Self Care | Payer: Medicare Other | Source: Ambulatory Visit | Attending: Neurological Surgery | Admitting: Neurological Surgery

## 2015-08-07 ENCOUNTER — Inpatient Hospital Stay (HOSPITAL_COMMUNITY): Admission: RE | Admit: 2015-08-07 | Payer: Self-pay | Source: Ambulatory Visit

## 2015-08-07 DIAGNOSIS — Z01818 Encounter for other preprocedural examination: Secondary | ICD-10-CM | POA: Insufficient documentation

## 2015-08-07 DIAGNOSIS — R9431 Abnormal electrocardiogram [ECG] [EKG]: Secondary | ICD-10-CM | POA: Diagnosis not present

## 2015-08-07 DIAGNOSIS — M7130 Other bursal cyst, unspecified site: Secondary | ICD-10-CM | POA: Insufficient documentation

## 2015-08-07 DIAGNOSIS — R918 Other nonspecific abnormal finding of lung field: Secondary | ICD-10-CM | POA: Insufficient documentation

## 2015-08-07 DIAGNOSIS — J449 Chronic obstructive pulmonary disease, unspecified: Secondary | ICD-10-CM | POA: Diagnosis not present

## 2015-08-07 DIAGNOSIS — Z01812 Encounter for preprocedural laboratory examination: Secondary | ICD-10-CM | POA: Diagnosis not present

## 2015-08-07 LAB — CBC WITH DIFFERENTIAL/PLATELET
BASOS ABS: 0 10*3/uL (ref 0.0–0.1)
Basophils Relative: 0 %
Eosinophils Absolute: 0.1 10*3/uL (ref 0.0–0.7)
Eosinophils Relative: 1 %
HCT: 42.3 % (ref 36.0–46.0)
HEMOGLOBIN: 13.7 g/dL (ref 12.0–15.0)
LYMPHS PCT: 13 %
Lymphs Abs: 1.1 10*3/uL (ref 0.7–4.0)
MCH: 37.2 pg — ABNORMAL HIGH (ref 26.0–34.0)
MCHC: 32.4 g/dL (ref 30.0–36.0)
MCV: 114.9 fL — ABNORMAL HIGH (ref 78.0–100.0)
MONOS PCT: 15 %
Monocytes Absolute: 1.2 10*3/uL — ABNORMAL HIGH (ref 0.1–1.0)
Neutro Abs: 5.9 10*3/uL (ref 1.7–7.7)
Neutrophils Relative %: 71 %
Platelets: 519 10*3/uL — ABNORMAL HIGH (ref 150–400)
RBC: 3.68 MIL/uL — AB (ref 3.87–5.11)
RDW: 14.8 % (ref 11.5–15.5)
WBC: 8.3 10*3/uL (ref 4.0–10.5)

## 2015-08-07 LAB — BASIC METABOLIC PANEL
ANION GAP: 9 (ref 5–15)
BUN: 20 mg/dL (ref 6–20)
CO2: 28 mmol/L (ref 22–32)
Calcium: 9.7 mg/dL (ref 8.9–10.3)
Chloride: 98 mmol/L — ABNORMAL LOW (ref 101–111)
Creatinine, Ser: 0.67 mg/dL (ref 0.44–1.00)
GFR calc Af Amer: >60 mL/min (ref >60)
GLUCOSE: 101 mg/dL — AB (ref 65–99)
POTASSIUM: 4.8 mmol/L (ref 3.5–5.1)
SODIUM: 135 mmol/L (ref 135–145)

## 2015-08-07 LAB — PROTIME-INR
INR: 1.02 (ref 0.00–1.49)
PROTHROMBIN TIME: 13.6 s (ref 11.6–15.2)

## 2015-08-07 MED ORDER — CHLORHEXIDINE GLUCONATE CLOTH 2 % EX PADS: 6.0000 | MEDICATED_PAD | Freq: Once | CUTANEOUS | Status: DC

## 2015-08-07 MED ORDER — VANCOMYCIN HCL IN DEXTROSE 1-5 GM/200ML-% IV SOLN: 1000.0000 mg | INTRAVENOUS | Status: DC

## 2015-08-07 MED ORDER — CHLORHEXIDINE GLUCONATE CLOTH 2 % EX PADS
6.0000 | MEDICATED_PAD | Freq: Once | CUTANEOUS | Status: DC
Start: 2015-08-07 — End: 2015-08-08

## 2015-08-07 MED ORDER — DEXAMETHASONE SODIUM PHOSPHATE 10 MG/ML IJ SOLN: 10.0000 mg | INTRAMUSCULAR | Status: DC

## 2015-08-07 NOTE — Pre-Procedure Instructions (Signed)
    AMOREENA NEUBERT  08/07/2015      [facility] - [place], [place] - [address] [place] [place] [address] Phone: [phone] Fax: [phone]    Your procedure is scheduled on 08/10/2015 .  Report to [facility] Admitting at  [place].M.  Call this number if you have problems the morning of surgery:  [phone]   Remember:  Do not eat food or drink liquids after midnight.  Take these medicines the morning of surgery with A SIP OF WATER atenolol ( tenormin), Breo elipta.  You may take tessalon, meclizine and tramadol as needed.  DO NOT take any aspirin products, (Goody powders or baby aspirin), ibuprofen,  naproxyn ( aleve) vitamin, herbal or fish oil supplements for 5 days prior to your surgery.   Do not wear jewelry, make-up or nail polish.  Do not wear lotions, powders, or perfumes.  You may wear deoderant.  Do not shave 48 hours prior to surgery.  .  Do not bring valuables to the hospital.  [facility] is not responsible for any belongings or valuables.  Contacts, dentures or bridgework may not be worn into surgery.  Leave your suitcase in the car.  After surgery it may be brought to your room.  For patients admitted to the hospital, discharge time will be determined by your treatment team.  Patients discharged the day of surgery will not be allowed to drive home.      Please read over the following fact sheets that you were given. Pain Booklet and Coughing and Deep Breathing

## 2015-08-08 ENCOUNTER — Encounter (HOSPITAL_COMMUNITY): Payer: Self-pay | Admitting: Emergency Medicine

## 2015-08-08 DIAGNOSIS — H53452 Other localized visual field defect, left eye: Secondary | ICD-10-CM | POA: Diagnosis not present

## 2015-08-08 DIAGNOSIS — H40013 Open angle with borderline findings, low risk, bilateral: Secondary | ICD-10-CM | POA: Diagnosis not present

## 2015-08-08 DIAGNOSIS — Z961 Presence of intraocular lens: Secondary | ICD-10-CM | POA: Diagnosis not present

## 2015-08-08 DIAGNOSIS — H353132 Nonexudative age-related macular degeneration, bilateral, intermediate dry stage: Secondary | ICD-10-CM | POA: Diagnosis not present

## 2015-08-08 NOTE — Progress Notes (Signed)
Anesthesia Chart Review:  Pt is an 82 year old female scheduled for L4-5 laminectomy and foraminotomy on 08/10/2015 with Sherley Bounds, MD.  PMH includes:  HTN, COPD, essential thrombocythemia. Former smoker. BMI 19  Medications include: ASA, atenolol, breo ellipta, hctz  Preoperative labs reviewed.    Chest x-ray 08/07/15 reviewed.  - COPD. - Nodular appearing density in the left mid lung on the frontal film. This was not as well appreciated on the prior exam. Noncontrast CT of the chest is recommended for further evaluation.  EKG 08/07/15: NSR. Nonspecific ST abnormality  Nuclear stress test 08/14/08: Normal stress nuclear study. No evidence of ischemia. Normal LV function.  I notified Lorriane Shire in Dr. Ronnald Ramp' office of CXR results and need for further testing.   If no changes, I anticipate pt can proceed with surgery as scheduled.   Willeen Cass, FNP-BC [facility]/Anesthesiology Phone: [phone] 08/08/2015 11:41 AM

## 2015-08-09 DIAGNOSIS — M549 Dorsalgia, unspecified: Secondary | ICD-10-CM | POA: Diagnosis not present

## 2015-08-10 ENCOUNTER — Encounter (HOSPITAL_COMMUNITY): Admission: RE | Payer: Self-pay | Source: Ambulatory Visit

## 2015-08-10 ENCOUNTER — Inpatient Hospital Stay (HOSPITAL_COMMUNITY): Admission: RE | Admit: 2015-08-10 | Payer: Medicare Other | Source: Ambulatory Visit | Admitting: Neurological Surgery

## 2015-08-10 SURGERY — LUMBAR LAMINECTOMY/DECOMPRESSION MICRODISCECTOMY 1 LEVEL
Anesthesia: General | Site: Back | Laterality: Left

## 2015-08-20 DIAGNOSIS — Z85828 Personal history of other malignant neoplasm of skin: Secondary | ICD-10-CM | POA: Diagnosis not present

## 2015-08-20 DIAGNOSIS — L570 Actinic keratosis: Secondary | ICD-10-CM | POA: Diagnosis not present

## 2015-08-20 DIAGNOSIS — D485 Neoplasm of uncertain behavior of skin: Secondary | ICD-10-CM | POA: Diagnosis not present

## 2015-08-20 DIAGNOSIS — C44722 Squamous cell carcinoma of skin of right lower limb, including hip: Secondary | ICD-10-CM | POA: Diagnosis not present

## 2015-09-19 DIAGNOSIS — N6489 Other specified disorders of breast: Secondary | ICD-10-CM | POA: Diagnosis not present

## 2015-09-19 DIAGNOSIS — Z803 Family history of malignant neoplasm of breast: Secondary | ICD-10-CM | POA: Diagnosis not present

## 2015-09-19 DIAGNOSIS — Z1231 Encounter for screening mammogram for malignant neoplasm of breast: Secondary | ICD-10-CM | POA: Diagnosis not present

## 2015-09-20 DIAGNOSIS — R922 Inconclusive mammogram: Secondary | ICD-10-CM | POA: Diagnosis not present

## 2015-09-26 ENCOUNTER — Other Ambulatory Visit: Payer: Self-pay | Admitting: Radiology

## 2015-09-26 DIAGNOSIS — N6011 Diffuse cystic mastopathy of right breast: Secondary | ICD-10-CM | POA: Diagnosis not present

## 2015-09-26 DIAGNOSIS — N6012 Diffuse cystic mastopathy of left breast: Secondary | ICD-10-CM | POA: Diagnosis not present

## 2015-09-26 DIAGNOSIS — N63 Unspecified lump in breast: Secondary | ICD-10-CM | POA: Diagnosis not present

## 2015-10-02 DIAGNOSIS — C44722 Squamous cell carcinoma of skin of right lower limb, including hip: Secondary | ICD-10-CM | POA: Diagnosis not present

## 2015-10-31 ENCOUNTER — Other Ambulatory Visit: Payer: Self-pay | Admitting: Family Medicine

## 2015-10-31 DIAGNOSIS — E538 Deficiency of other specified B group vitamins: Secondary | ICD-10-CM | POA: Diagnosis not present

## 2015-10-31 DIAGNOSIS — J449 Chronic obstructive pulmonary disease, unspecified: Secondary | ICD-10-CM | POA: Diagnosis not present

## 2015-10-31 DIAGNOSIS — Z0000 Encounter for general adult medical examination without abnormal findings: Secondary | ICD-10-CM | POA: Diagnosis not present

## 2015-10-31 DIAGNOSIS — Z23 Encounter for immunization: Secondary | ICD-10-CM | POA: Diagnosis not present

## 2015-10-31 DIAGNOSIS — M810 Age-related osteoporosis without current pathological fracture: Secondary | ICD-10-CM | POA: Diagnosis not present

## 2015-10-31 DIAGNOSIS — D473 Essential (hemorrhagic) thrombocythemia: Secondary | ICD-10-CM | POA: Diagnosis not present

## 2015-10-31 DIAGNOSIS — M25551 Pain in right hip: Secondary | ICD-10-CM | POA: Diagnosis not present

## 2015-10-31 DIAGNOSIS — I10 Essential (primary) hypertension: Secondary | ICD-10-CM | POA: Diagnosis not present

## 2015-10-31 DIAGNOSIS — N390 Urinary tract infection, site not specified: Secondary | ICD-10-CM | POA: Diagnosis not present

## 2015-10-31 DIAGNOSIS — F322 Major depressive disorder, single episode, severe without psychotic features: Secondary | ICD-10-CM | POA: Diagnosis not present

## 2015-11-07 DIAGNOSIS — K1379 Other lesions of oral mucosa: Secondary | ICD-10-CM | POA: Diagnosis not present

## 2015-11-13 ENCOUNTER — Ambulatory Visit
Admission: RE | Admit: 2015-11-13 | Discharge: 2015-11-13 | Disposition: A | Discharge status: Home / Self Care | Payer: Medicare Other | Source: Ambulatory Visit | Attending: Family Medicine | Admitting: Family Medicine

## 2015-11-13 DIAGNOSIS — M810 Age-related osteoporosis without current pathological fracture: Secondary | ICD-10-CM | POA: Diagnosis not present

## 2015-11-13 DIAGNOSIS — Z780 Asymptomatic menopausal state: Secondary | ICD-10-CM | POA: Diagnosis not present

## 2015-12-12 DIAGNOSIS — H353132 Nonexudative age-related macular degeneration, bilateral, intermediate dry stage: Secondary | ICD-10-CM | POA: Diagnosis not present

## 2015-12-12 DIAGNOSIS — Z961 Presence of intraocular lens: Secondary | ICD-10-CM | POA: Diagnosis not present

## 2015-12-12 DIAGNOSIS — H04201 Unspecified epiphora, right lacrimal gland: Secondary | ICD-10-CM | POA: Diagnosis not present

## 2015-12-12 DIAGNOSIS — H40013 Open angle with borderline findings, low risk, bilateral: Secondary | ICD-10-CM | POA: Diagnosis not present

## 2015-12-12 DIAGNOSIS — H53452 Other localized visual field defect, left eye: Secondary | ICD-10-CM | POA: Diagnosis not present

## 2015-12-27 ENCOUNTER — Other Ambulatory Visit: Payer: Self-pay | Admitting: Neurological Surgery

## 2015-12-27 DIAGNOSIS — R918 Other nonspecific abnormal finding of lung field: Secondary | ICD-10-CM

## 2016-01-03 ENCOUNTER — Other Ambulatory Visit: Payer: Self-pay

## 2016-01-03 ENCOUNTER — Ambulatory Visit
Admission: RE | Admit: 2016-01-03 | Discharge: 2016-01-03 | Disposition: A | Discharge status: Home / Self Care | Payer: Medicare Other | Source: Ambulatory Visit | Attending: Neurological Surgery | Admitting: Neurological Surgery

## 2016-01-03 DIAGNOSIS — R918 Other nonspecific abnormal finding of lung field: Secondary | ICD-10-CM | POA: Diagnosis not present

## 2016-01-10 ENCOUNTER — Other Ambulatory Visit (HOSPITAL_COMMUNITY): Payer: Self-pay | Admitting: Family Medicine

## 2016-01-10 DIAGNOSIS — R918 Other nonspecific abnormal finding of lung field: Secondary | ICD-10-CM

## 2016-01-18 ENCOUNTER — Ambulatory Visit (HOSPITAL_COMMUNITY)
Admission: RE | Admit: 2016-01-18 | Discharge: 2016-01-18 | Disposition: A | Discharge status: Home / Self Care | Payer: Medicare Other | Source: Ambulatory Visit | Attending: Family Medicine | Admitting: Family Medicine

## 2016-01-18 DIAGNOSIS — I700 Atherosclerosis of aorta: Secondary | ICD-10-CM | POA: Insufficient documentation

## 2016-01-18 DIAGNOSIS — R911 Solitary pulmonary nodule: Secondary | ICD-10-CM | POA: Diagnosis not present

## 2016-01-18 DIAGNOSIS — K8020 Calculus of gallbladder without cholecystitis without obstruction: Secondary | ICD-10-CM | POA: Diagnosis not present

## 2016-01-18 DIAGNOSIS — J439 Emphysema, unspecified: Secondary | ICD-10-CM | POA: Insufficient documentation

## 2016-01-18 DIAGNOSIS — R918 Other nonspecific abnormal finding of lung field: Secondary | ICD-10-CM | POA: Diagnosis not present

## 2016-01-18 DIAGNOSIS — R590 Localized enlarged lymph nodes: Secondary | ICD-10-CM | POA: Insufficient documentation

## 2016-01-18 LAB — GLUCOSE, CAPILLARY: GLUCOSE-CAPILLARY: 115 mg/dL — AB (ref 65–99)

## 2016-01-18 MED ORDER — FLUDEOXYGLUCOSE F - 18 (FDG) INJECTION
6.0000 | Freq: Once | INTRAVENOUS | Status: AC | PRN
  Administered 2016-01-18: 6 via INTRAVENOUS

## 2016-01-29 ENCOUNTER — Telehealth: Payer: Self-pay | Admitting: *Deleted

## 2016-01-29 ENCOUNTER — Encounter: Payer: Self-pay | Admitting: *Deleted

## 2016-01-29 NOTE — Telephone Encounter (Signed)
Oncology Nurse Navigator Documentation  Oncology Nurse Navigator Flowsheets 01/29/2016  Navigator Location [facility]-  Referral date to RadOnc/MedOnc 01/29/2016  Navigator Encounter Type Telephone/I received referral on Kathy Pierce today.  I set her up for [facility] on 02/07/16.  She verbalized understanding of appt time and place.   Telephone Outgoing Call  Treatment Phase Pre-Tx/Tx Discussion  Barriers/Navigation Needs Coordination of Care  Interventions Coordination of Care  Coordination of Care Appts  Acuity Level 1  Acuity Level 1 Initial guidance, education and coordination as needed  Time Spent with Patient 15

## 2016-02-04 DIAGNOSIS — J449 Chronic obstructive pulmonary disease, unspecified: Secondary | ICD-10-CM | POA: Diagnosis not present

## 2016-02-06 ENCOUNTER — Telehealth: Payer: Self-pay | Admitting: *Deleted

## 2016-02-06 NOTE — Telephone Encounter (Signed)
Called pt and confirmed 02/07/16 clinic appt w/ her.

## 2016-02-07 ENCOUNTER — Other Ambulatory Visit: Payer: Self-pay | Admitting: *Deleted

## 2016-02-07 ENCOUNTER — Encounter: Payer: Self-pay | Admitting: Radiation Oncology

## 2016-02-07 ENCOUNTER — Encounter: Payer: Self-pay | Admitting: *Deleted

## 2016-02-07 ENCOUNTER — Ambulatory Visit (HOSPITAL_BASED_OUTPATIENT_CLINIC_OR_DEPARTMENT_OTHER): Payer: Medicare Other | Admitting: Internal Medicine

## 2016-02-07 ENCOUNTER — Institutional Professional Consult (permissible substitution) (INDEPENDENT_AMBULATORY_CARE_PROVIDER_SITE_OTHER): Payer: Medicare Other | Admitting: Cardiothoracic Surgery

## 2016-02-07 ENCOUNTER — Encounter: Payer: Self-pay | Admitting: Internal Medicine

## 2016-02-07 ENCOUNTER — Ambulatory Visit
Admission: RE | Admit: 2016-02-07 | Discharge: 2016-02-07 | Disposition: A | Discharge status: Home / Self Care | Payer: Medicare Other | Source: Ambulatory Visit | Attending: Radiation Oncology | Admitting: Radiation Oncology

## 2016-02-07 ENCOUNTER — Ambulatory Visit: Payer: Medicare Other | Attending: Internal Medicine | Admitting: Physical Therapy

## 2016-02-07 VITALS — BP 144/66 | HR 74 | Temp 98.0°F | Resp 16 | Ht 64.0 in | Wt 113.0 lb

## 2016-02-07 DIAGNOSIS — J984 Other disorders of lung: Secondary | ICD-10-CM | POA: Diagnosis not present

## 2016-02-07 DIAGNOSIS — R2681 Unsteadiness on feet: Secondary | ICD-10-CM

## 2016-02-07 DIAGNOSIS — R29898 Other symptoms and signs involving the musculoskeletal system: Secondary | ICD-10-CM

## 2016-02-07 DIAGNOSIS — R911 Solitary pulmonary nodule: Secondary | ICD-10-CM

## 2016-02-07 DIAGNOSIS — C3432 Malignant neoplasm of lower lobe, left bronchus or lung: Secondary | ICD-10-CM | POA: Insufficient documentation

## 2016-02-07 DIAGNOSIS — R293 Abnormal posture: Secondary | ICD-10-CM

## 2016-02-07 DIAGNOSIS — Z87891 Personal history of nicotine dependence: Secondary | ICD-10-CM | POA: Diagnosis not present

## 2016-02-07 DIAGNOSIS — R918 Other nonspecific abnormal finding of lung field: Secondary | ICD-10-CM | POA: Diagnosis not present

## 2016-02-07 NOTE — Progress Notes (Signed)
[address]       [place] [address]             [phone]                    [place] W Batalla [facility] Medical Record #[id number] Date of Birth: 10-18-1925  Referring: [name], MD Primary Care: [name], MD  Chief Complaint:  Lung Mass   History of Present Illness:    Kathy Pierce 83 y.o. female is seen in the office  today for abnormal chest xray and CT of the chest. She was dx with COPD in 2015 by Dr Glynn Octave. She was to have back surgery in July 2017, a preop chest xray was done but she decided against surgery. The chest xray was abnormal and CT of chest was recommended.  She is a former smoker , quit 30 years ago   She denies , cough, hemoptysis or recent respiratory infection    Current Activity/ Functional Status:  Patient is independent with mobility/ambulation, transfers, ADL's, IADL's.   Zubrod Score: At the time of surgery this patient's most appropriate activity status/level should be described as: '[]'$     0    Normal activity, no symptoms '[x]'$     1    Restricted in physical strenuous activity but ambulatory, able to do out light work '[]'$     2    Ambulatory and capable of self care, unable to do work activities, up and about               >50 % of waking hours                              '[]'$     3    Only limited self care, in bed greater than 50% of waking hours '[]'$     4    Completely disabled, no self care, confined to bed or chair '[]'$     5    Moribund   Past Medical History:  Diagnosis Date  . Cancer (HCC)    Squamous cell carcinoma, skin, basal cell carcinoma  . COPD (chronic obstructive pulmonary disease) ([place])   . Depression   . Essential thrombocythemia ([place])   . Hypertension   . Osteoporosis   . Sciatica of left side 06/16/2012  . Spinal stenosis of lumbar region 06/16/2012  . Vitamin B12 deficiency     Past Surgical History:  Procedure Laterality Date  . APPENDECTOMY    . CATARACT EXTRACTION, BILATERAL Bilateral    . STAPEDECTOMY Right   . TONSILLECTOMY      Family History  Problem Relation Age of Onset  . Heart attack Father   . Cancer Mother     Social History   Social History  . Marital status: Widowed    Spouse name: N/A  . Number of children: N/A  . Years of education: N/A    Social History Main Topics  . Smoking status: Former Smoker    Quit date: 07/10/1976  . Smokeless tobacco: Not on file  . Alcohol use No  . Drug use: Unknown  . Sexual activity: Not on file      History  Smoking Status  . Former Smoker  . Quit date: 07/10/1976  Smokeless Tobacco  . Not on file    History  Alcohol Use No     Allergies  Allergen Reactions  .  Penicillins Hives and Rash  . Codeine Nausea And Vomiting  . Erythromycin Nausea And Vomiting  . Sulfa Drugs Cross Reactors Nausea And Vomiting    Current Outpatient Prescriptions  Medication Sig Dispense Refill  . aspirin 81 MG tablet Take 81 mg by mouth daily.    Marland Kitchen atenolol (TENORMIN) 50 MG tablet Take 50 mg by mouth daily.    . benzonatate (TESSALON) 100 MG capsule Take 100 mg by mouth 3 (three) times daily as needed for cough.    . calcium carbonate (OS-CAL - DOSED IN MG OF ELEMENTAL CALCIUM) 1250 MG tablet Take 1 tablet by mouth daily.    . cyanocobalamin (,VITAMIN B-12,) 1000 MCG/ML injection Inject 1,000 mcg into the muscle every 30 (thirty) days.    . fluticasone furoate-vilanterol (BREO ELLIPTA) 100-25 MCG/INH AEPB Inhale 1 puff into the lungs daily.    . hydrochlorothiazide (MICROZIDE) 12.5 MG capsule Take 12.5 mg by mouth daily.    . hydroxyurea (HYDREA) 500 MG capsule Take 500 mg by mouth every other day. May take with food to minimize GI side effects.    . meclizine (ANTIVERT) 25 MG tablet Take 25 mg by mouth 3 (three) times daily as needed for dizziness.    . sertraline (ZOLOFT) 50 MG tablet Take 50 mg by mouth daily.    . traMADol (ULTRAM) 50 MG tablet Take 50 mg by mouth every 8 (eight) hours as needed. pain  1   No  current facility-administered medications for this visit.       Review of Systems:     Cardiac Review of Systems: Y or N  Chest Pain [  n  ]  Resting SOB [n   ] Exertional SOB  [  y]  Orthopnea [ n ]   Pedal Edema [  n ]    Palpitations [ n ] Syncope  [ n ]   Presyncope [  n ]  General Review of Systems: [Y] = yes [  ]=no Constitional: recent weight change [ n ];  Wt loss over the last 3 months [   ] anorexia [  ]; fatigue [  ]; nausea [  ]; night sweats [  ]; fever [  ]; or chills [  ];          Dental: poor dentition[  ]; Last Dentist visit:   Eye : blurred vision [  ]; diplopia [   ]; vision changes [  ];  Amaurosis fugax[  ]; Resp: cough [ n ];  wheezing[ n ];  hemoptysis[ n ]; shortness of breath[y  ]; paroxysmal nocturnal dyspnea[n  ]; dyspnea on exertion[y  ]; or orthopnean[  ];  GI:  gallstones[  ], vomiting[  ];  dysphagia[  ]; melena[  ];  hematochezia [  ]; heartburn[  ];   Hx of  Colonoscopy[  ]; GU: kidney stones [  ]; hematuria[  ];   dysuria [  ];  nocturia[  ];  history of     obstruction [  ]; urinary frequency [  ]             Skin: rash, swelling[  ];, hair loss[  ];  peripheral edema[  ];  or itching[  ]; Musculosketetal: myalgias[  ];  joint swelling[  ];  joint erythema[  ];  joint pain[  ];  back pain[  ];  Heme/Lymph: bruising[  ];  bleeding[  ];  anemia[  ];  Neuro: TIA[n  ];  headaches[  ];  stroke[  ];  vertigo[  ];  seizures[  ];   paresthesias[  ];  difficulty walking[  ];  Psych:depression[n  ]; anxiety[n  ];  Endocrine: diabetes[  ];  thyroid dysfunction[  ];  Immunizations: Flu up to date [ unknown  ]; Pneumococcal up to date [unknown  ];  Other:  Physical Exam: See flow sheet for VS   PHYSICAL EXAMINATION: General appearance: alert, cooperative, appears stated age and no distress Head: Normocephalic, without obvious abnormality, atraumatic Neck: no adenopathy, no carotid bruit, no JVD, supple, symmetrical, trachea midline and thyroid not enlarged,  symmetric, no tenderness/mass/nodules Lymph nodes: Cervical, supraclavicular, and axillary nodes normal. Resp: diminished breath sounds bibasilar Back: symmetric, no curvature. ROM normal. No CVA tenderness. Cardio: regular rate and rhythm, S1, S2 normal, no murmur, click, rub or gallop GI: soft, non-tender; bowel sounds normal; no masses,  no organomegaly Extremities: extremities normal, atraumatic, no cyanosis or edema and Homans sign is negative, no sign of DVT Neurologic: Grossly normal  Diagnostic Studies & Laboratory data:     Recent Radiology Findings:   Nm Pet Image Initial (pi) Skull Base To Thigh  Result Date: 01/18/2016 CLINICAL DATA:  Initial treatment strategy for lung mass. EXAM: NUCLEAR MEDICINE PET SKULL BASE TO THIGH TECHNIQUE: 6.0 mCi F-18 FDG was injected intravenously. Full-ring PET imaging was performed from the skull base to thigh after the radiotracer. CT data was obtained and used for attenuation correction and anatomic localization. FASTING BLOOD GLUCOSE:  Value: 115 mg/dl COMPARISON:  CT chest 01/03/2016 FINDINGS: NECK No hypermetabolic lymph nodes in the neck. CHEST Spiculated left lower lobe pulmonary lesion is hypermetabolic with SUV max = 5.1. Previously characterized AP window lymph node is hypermetabolic with SUV max = 4.4. Central hypermetabolic right axillary lymph node is hypermetabolic with SUV max = 6.2. Other hypermetabolic lymph nodes are identified in the hilar regions bilaterally. The heart is enlarged. Coronary artery and thoracic aortic atherosclerosis are evident. Diffuse emphysematous change noted in the lungs. Right apical scarring again noted. ABDOMEN/PELVIS Layering tiny calcified stones are seen in the lumen of the gallbladder. There is a large right upper quadrant diverticulum, presumably duodenal in origin. Atherosclerotic calcification noted in the wall of the abdominal aorta without aneurysm. Left colonic diverticulosis without diverticulitis.  SKELETON No focal hypermetabolic activity to suggest skeletal metastasis. IMPRESSION: 1. Left lower lobe pulmonary nodule is hypermetabolic and is associated with hypermetabolic lymph nodes in the AP window, precarinal station, and both hilar regions. 2. Emphysema. 3. Coronary artery and thoracoabdominal aortic atherosclerosis. 4. Cholelithiasis. Electronically Signed   By: Misty Stanley M.D.   On: 01/18/2016 15:51   I have independently reviewed the above radiology studies  and reviewed the findings with the patient.   Recent Lab Findings: Lab Results  Component Value Date   WBC 8.3 08/07/2015   HGB 13.7 08/07/2015   HCT 42.3 08/07/2015   PLT 519 (H) 08/07/2015   GLUCOSE 101 (H) 08/07/2015   NA 135 08/07/2015   K 4.8 08/07/2015   CL 98 (L) 08/07/2015   CREATININE 0.67 08/07/2015   BUN 20 08/07/2015   CO2 28 08/07/2015   INR 1.02 08/07/2015      Assessment / Plan:   Left lower lobe lung lesion consistent with carcinoma of the lung on ct and pet , PET suggests at least stage III ( in mediastinal nodes are positive.   AP window lymph node is hypermetabolic  Central hypermetabolic right axillary lymph node is  hypermetabolic with SUV max = 6.2.   hypermetabolic lymph nodes are identified in the hilar regions bilaterally  Gall Stones  Duodenal  Diverticulum-  large right upper quadrant ,  Atherosclerotic calcification noted in the wall of the abdominal aorta without aneurysm.    I have discussed with the patient proceeding with Bronch, EBUS and navigation biopsy to obtain tissue dx and tissue staging of mediastinal nodes   I  spent 40 minutes counseling the patient face to face and 50% or more the  time was spent in counseling and coordination of care. The total time spent in the appointment was 60 minutes. Patient radiology reviewed at [facility] conference this am.  Grace Isaac MD      [address] [place] [address] Office [phone]   Beeper  [phone]  02/10/2016 6:35 PM

## 2016-02-07 NOTE — Progress Notes (Signed)
Oncology Nurse Navigator Documentation  Oncology Nurse Navigator Flowsheets 02/07/2016  Navigator Location [facility]  Navigator Encounter Type Other/per cancer conference discussion I called Dr. Annamaria Boots to follow up on his thoughts regarding obtaining tissue.  He was short with me on the phone.  He states he has not seen her in years.  He did not clarify how he would like to proceed.  Patient is scheduled to see Dr. [name] today and I notified his office.   Treatment Phase Pre-Tx/Tx Discussion  Barriers/Navigation Needs Coordination of Care  Interventions Coordination of Care  Time Spent with Patient 15

## 2016-02-07 NOTE — Progress Notes (Signed)
[place] [phone]   [phone] Multidisciplinary thoracic oncology clinic  CONSULT NOTE  REFERRING PHYSICIAN: Dr. [name]  REASON FOR CONSULTATION:  83 years old white female with questionable lung cancer.  HPI KENNETH LAX is a 83 y.o. female with remote history of smoking and past medical history significant for COPD, hypertension, osteoporosis, hearing deficit, depression, spinal stenosis with left-sided sciatica. The patient had preoperative chest x-ray on 08/07/2015 in preparation for lumbar laminectomy. The chest x-ray showed nodular density in the left midlung. She was supposed to have follow-up chest x-ray for further evaluation of this lesion but for some reason this was not done and the patient was seen again by Dr. Sherley Bounds who ordered CT scan of the chest without contrast on 01/03/2016 and it showed a spiculated superior segment left lower lobe mass measuring 1.6 x 1.4 x 1.7 cm with precarinal and subcarinal lymphadenopathy up to 1.1 cm in short axis. The findings are concerning for neoplasm with possible metastatic disease to the mediastinal lymph nodes. A PET scan was performed on 01/18/2016 and it showed hypermetabolic spiculated left lower lobe pulmonary lesion with SUV max of 5.1. The previously characterized AP window lymph node was also hypermetabolic with SUV max of 4.4. There was central hypermetabolic right axillary lymph node that is hypermetabolic with SUV max of 6.2. Other hypermetabolic nodes were identified in the hilar regions bilaterally. The patient was seen by her primary care physician Dr. Inda Merlin who kindly referred her to the multidisciplinary thoracic oncology clinic today for evaluation and recommendation regarding her condition. When seen today the patient is very anxious. She has no chest pain but continues to have shortness of breath and dry cough. She denied having any hemoptysis. She denied having any  significant weight loss or night sweats. She has no nausea, vomiting, diarrhea or constipation. She denied having any headache or visual changes. Family history significant for mother with colon cancer, father had prostate cancer and heart disease. Her aunt had lung cancer and daughter died from cholangiocarcinoma. The patient is single and has 2 children. She is to work with her husband in [employer]. She was accompanied today by her daughter [name] who lives in [place] and her granddaughter Bethena Roys who lives in [place]. The patient lives by herself in [place]. She has a history of smoking less than one pack per day for around 20 years and quit in 1979. She drinks alcohol occasionally and no history of drug abuse.   HPI  Past Medical History:  Diagnosis Date  . Cancer (HCC)    Squamous cell carcinoma, skin, basal cell carcinoma  . COPD (chronic obstructive pulmonary disease) ([place])   . Depression   . Essential thrombocythemia ([place])   . Hypertension   . Osteoporosis   . Sciatica of left side 06/16/2012  . Spinal stenosis of lumbar region 06/16/2012  . Vitamin B12 deficiency     Past Surgical History:  Procedure Laterality Date  . APPENDECTOMY    . CATARACT EXTRACTION, BILATERAL Bilateral   . STAPEDECTOMY Right   . TONSILLECTOMY      Family History  Problem Relation Age of Onset  . Heart attack Father   . Cancer Mother     Social History Social History  Substance Use Topics  . Smoking status: Former Smoker    Quit date: 07/10/1976  . Smokeless tobacco: Not on file  . Alcohol use No    Allergies  Allergen Reactions  .  Codeine Nausea And Vomiting  . Erythromycin Nausea And Vomiting  . Penicillins Rash  . Sulfa Drugs Cross Reactors Nausea And Vomiting    Current Outpatient Prescriptions  Medication Sig Dispense Refill  . aspirin 81 MG tablet Take 81 mg by mouth daily.    Marland Kitchen atenolol (TENORMIN) 50 MG tablet Take 50 mg by mouth daily.    .  benzonatate (TESSALON) 100 MG capsule Take 100 mg by mouth 3 (three) times daily as needed for cough.    . calcium carbonate (OS-CAL - DOSED IN MG OF ELEMENTAL CALCIUM) 1250 MG tablet Take 1 tablet by mouth daily.    . cyanocobalamin (,VITAMIN B-12,) 1000 MCG/ML injection Inject 1,000 mcg into the muscle every 30 (thirty) days.    . fluticasone furoate-vilanterol (BREO ELLIPTA) 100-25 MCG/INH AEPB Inhale 1 puff into the lungs daily.    . hydrochlorothiazide (MICROZIDE) 12.5 MG capsule Take 12.5 mg by mouth daily.    . hydroxyurea (HYDREA) 500 MG capsule Take 500 mg by mouth every other day. May take with food to minimize GI side effects.    . meclizine (ANTIVERT) 25 MG tablet Take 25 mg by mouth 3 (three) times daily as needed for dizziness.    . sertraline (ZOLOFT) 50 MG tablet Take 50 mg by mouth daily.    . traMADol (ULTRAM) 50 MG tablet Take 50 mg by mouth every 8 (eight) hours as needed. pain  1   No current facility-administered medications for this visit.     Review of Systems  Constitutional: positive for fatigue Eyes: negative Ears, nose, mouth, throat, and face: negative Respiratory: positive for cough and dyspnea on exertion Cardiovascular: negative Gastrointestinal: negative Genitourinary:negative Integument/breast: negative Hematologic/lymphatic: negative Musculoskeletal:negative Neurological: negative Behavioral/Psych: positive for anxiety Endocrine: negative Allergic/Immunologic: negative  Physical Exam  IEP:PIRJJ, healthy, no distress, well nourished, well developed and anxious SKIN: skin color, texture, turgor are normal, no rashes or significant lesions HEAD: Normocephalic, No masses, lesions, tenderness or abnormalities EYES: normal, PERRLA, Conjunctiva are pink and non-injected EARS: External ears normal, Canals clear OROPHARYNX:no exudate, no erythema and lips, buccal mucosa, and tongue normal  NECK: supple, no adenopathy, no JVD LYMPH:  no palpable  lymphadenopathy, no hepatosplenomegaly BREAST:not examined LUNGS: clear to auscultation , and palpation HEART: regular rate & rhythm, no murmurs and no gallops ABDOMEN:abdomen soft, non-tender, normal bowel sounds and no masses or organomegaly BACK: Back symmetric, no curvature., No CVA tenderness EXTREMITIES:no joint deformities, effusion, or inflammation, no edema, no skin discoloration  NEURO: alert & oriented x 3 with fluent speech, no focal motor/sensory deficits  PERFORMANCE STATUS: ECOG 1  LABORATORY DATA: Lab Results  Component Value Date   WBC 8.3 08/07/2015   HGB 13.7 08/07/2015   HCT 42.3 08/07/2015   MCV 114.9 (H) 08/07/2015   PLT 519 (H) 08/07/2015      Chemistry      Component Value Date/Time   NA 135 08/07/2015 1430   K 4.8 08/07/2015 1430   CL 98 (L) 08/07/2015 1430   CO2 28 08/07/2015 1430   BUN 20 08/07/2015 1430   CREATININE 0.67 08/07/2015 1430      Component Value Date/Time   CALCIUM 9.7 08/07/2015 1430       RADIOGRAPHIC STUDIES: Nm Pet Image Initial (pi) Skull Base To Thigh  Result Date: 01/18/2016 CLINICAL DATA:  Initial treatment strategy for lung mass. EXAM: NUCLEAR MEDICINE PET SKULL BASE TO THIGH TECHNIQUE: 6.0 mCi F-18 FDG was injected intravenously. Full-ring PET imaging was performed from the  skull base to thigh after the radiotracer. CT data was obtained and used for attenuation correction and anatomic localization. FASTING BLOOD GLUCOSE:  Value: 115 mg/dl COMPARISON:  CT chest 01/03/2016 FINDINGS: NECK No hypermetabolic lymph nodes in the neck. CHEST Spiculated left lower lobe pulmonary lesion is hypermetabolic with SUV max = 5.1. Previously characterized AP window lymph node is hypermetabolic with SUV max = 4.4. Central hypermetabolic right axillary lymph node is hypermetabolic with SUV max = 6.2. Other hypermetabolic lymph nodes are identified in the hilar regions bilaterally. The heart is enlarged. Coronary artery and thoracic aortic  atherosclerosis are evident. Diffuse emphysematous change noted in the lungs. Right apical scarring again noted. ABDOMEN/PELVIS Layering tiny calcified stones are seen in the lumen of the gallbladder. There is a large right upper quadrant diverticulum, presumably duodenal in origin. Atherosclerotic calcification noted in the wall of the abdominal aorta without aneurysm. Left colonic diverticulosis without diverticulitis. SKELETON No focal hypermetabolic activity to suggest skeletal metastasis. IMPRESSION: 1. Left lower lobe pulmonary nodule is hypermetabolic and is associated with hypermetabolic lymph nodes in the AP window, precarinal station, and both hilar regions. 2. Emphysema. 3. Coronary artery and thoracoabdominal aortic atherosclerosis. 4. Cholelithiasis. Electronically Signed   By: Misty Stanley M.D.   On: 01/18/2016 15:51    ASSESSMENT: This is a very pleasant 83 years old white female with highly suspicious of stage IIIB (T1a, N3, M0) lung cancer likely a non-small cell carcinoma presented with left lower lobe lung nodule in addition to bilateral hilar and mediastinal lymphadenopathy, pending tissue diagnosis.   PLAN: I had a lengthy discussion with the patient and her family today about her current disease stage, prognosis and treatment options. I personally and independently reviewed the scan images and discuss the results with the patient and her family and showed them the images. I recommended for the patient to see Dr. [name] from cardiothoracic surgery for consideration of bronchoscopy with endobronchial ultrasound and biopsy of the mediastinal and bilateral hilar lymphadenopathy in addition to navigational bronchoscopy to the left lower lobe lung nodule. If the mediastinal and hilar lymphadenopathy are consistent with malignancy, the patient will be treated with a course of concurrent chemoradiation but if negative she may benefit from stereotactic radiotherapy to the left lower lobe  pulmonary nodule. I will not order MRI of the brain until we confirm the tissue diagnosis. The patient was seen during the multidisciplinary thoracic oncology clinic today by medical oncology, radiation oncology, thoracic surgeon, thoracic navigator, social worker and physical therapist. She would come back for follow-up visit in 1-2 weeks for evaluation after confirmation of the tissue diagnosis. The patient was advised to call immediately if she has any concerning symptoms in the interval.  The patient voices understanding of current disease status and treatment options and is in agreement with the current care plan.  All questions were answered. The patient knows to call the clinic with any problems, questions or concerns. We can certainly see the patient much sooner if necessary.  Thank you so much for allowing me to participate in the care of Kathy Pierce. I will continue to follow up the patient with you and assist in her care.  I spent 40 minutes counseling the patient face to face. The total time spent in the appointment was 60 minutes.  Disclaimer: This note was dictated with voice recognition software. Similar sounding words can inadvertently be transcribed and may not be corrected upon review.   Kayla Deshaies K. February 07, 2016, 3:34 PM

## 2016-02-07 NOTE — Progress Notes (Signed)
Radiation Oncology         [phone] ________________________________  Multidisciplinary Thoracic Oncology Clinic [facility]) Initial Outpatient Consultation  Name: Kathy Pierce MRN: [id number]  Date: 02/07/2016  DOB: [birth year]  AS:TMHDQ,QIWLN RUTH, MD  Deneise Lever, MD   REFERRING PHYSICIAN: Deneise Lever, MD  DIAGNOSIS: 83 yo woman with hypermetabolic RLL nodule and bilateral mediastinal nodes pending biopsy    ICD-9-CM ICD-10-CM   1. Lung nodule 793.11 R91.1     HISTORY OF PRESENT ILLNESS::Kathy Pierce is a 83 y.o. female who had a history of lumbar back pain radiating to the left buttock for 2 months on 06/26/15. A chest x-ray was obtained on 08/07/15 for pre-op evaluation for lumbar surgery. Chest CT revealed nodular appearing density in the left mid lung on frontal film. This was not appreciated on the prior exam. Follow up chest CT on 01/03/16 showed a spiculated superior segment left lower lobe mass measuring 16 x 14 x 17 mm with precarinal and subcarinal lymphadenopathy up to 11 mm short axis. PET scan on 01/18/16 revealed hypermetabolic left lower lobe pulmonary nodule associated with hypermetabolic lymph nodes in the AP window, precarinal station, and both hilar regions. It also showed emphysema, coronary artery and thoracoabdominal aortic atherosclerosis, and cholelithiasis.  The patient was referred today for presentation in the multidisciplinary conference.  Radiology studies and pathology slides were presented there for review and discussion of treatment options.  A consensus was discussed regarding potential next steps.  PREVIOUS RADIATION THERAPY: No  PAST MEDICAL HISTORY:  has a past medical history of Cancer ([place]); COPD (chronic obstructive pulmonary disease) ([place]); Depression; Essential thrombocythemia ([place]); Hypertension; Osteoporosis; Sciatica of left side (06/16/2012); Spinal stenosis of lumbar region (06/16/2012); and Vitamin B12 deficiency.    PAST SURGICAL  HISTORY: Past Surgical History:  Procedure Laterality Date  . APPENDECTOMY    . CATARACT EXTRACTION, BILATERAL Bilateral   . STAPEDECTOMY Right   . TONSILLECTOMY      FAMILY HISTORY: family history includes Cancer in her mother; Heart attack in her father.  SOCIAL HISTORY:  reports that she quit smoking about 39 years ago. She does not have any smokeless tobacco history on file. She reports that she does not drink alcohol.  ALLERGIES: Codeine; Erythromycin; Penicillins; and Sulfa drugs cross reactors  MEDICATIONS:  Current Outpatient Prescriptions  Medication Sig Dispense Refill  . aspirin 81 MG tablet Take 81 mg by mouth daily.    Marland Kitchen atenolol (TENORMIN) 50 MG tablet Take 50 mg by mouth daily.    . benzonatate (TESSALON) 100 MG capsule Take 100 mg by mouth 3 (three) times daily as needed for cough.    . calcium carbonate (OS-CAL - DOSED IN MG OF ELEMENTAL CALCIUM) 1250 MG tablet Take 1 tablet by mouth daily.    . cyanocobalamin (,VITAMIN B-12,) 1000 MCG/ML injection Inject 1,000 mcg into the muscle every 30 (thirty) days.    . fluticasone furoate-vilanterol (BREO ELLIPTA) 100-25 MCG/INH AEPB Inhale 1 puff into the lungs daily.    . hydrochlorothiazide (MICROZIDE) 12.5 MG capsule Take 12.5 mg by mouth daily.    . hydroxyurea (HYDREA) 500 MG capsule Take 500 mg by mouth every other day. May take with food to minimize GI side effects.    . meclizine (ANTIVERT) 25 MG tablet Take 25 mg by mouth 3 (three) times daily as needed for dizziness.    . sertraline (ZOLOFT) 50 MG tablet Take 50 mg by mouth daily.    . traMADol (ULTRAM) 50  MG tablet Take 50 mg by mouth every 8 (eight) hours as needed. pain  1   No current facility-administered medications for this encounter.     REVIEW OF SYSTEMS:  A 15 point review of systems is documented in the electronic medical record. This was obtained by the nursing staff. However, I reviewed this with the patient to discuss relevant findings and make  appropriate changes.  Pertinent items noted in HPI and remainder of comprehensive ROS otherwise negative.   PHYSICAL EXAM:  weight is 113 lb (51.3 kg). Her temperature is 98 F (36.7 C). Her blood pressure is 144/66 (abnormal) and her pulse is 74. Her respiration is 16 and oxygen saturation is 96%.     The patient's head is normocephalic and atraumatic. Respriratory effort unlabored and without coughing. Extremities are free of cyanosis clubbing and edema The patient is neurologically grossly intact. Speech is fluent articulate Gait is normal The patient's mood and affect are appropriate   KPS = 80  100 - Normal; no complaints; no evidence of disease. 90   - Able to carry on normal activity; minor signs or symptoms of disease. 80   - Normal activity with effort; some signs or symptoms of disease. 31   - Cares for self; unable to carry on normal activity or to do active work. 60   - Requires occasional assistance, but is able to care for most of his personal needs. 50   - Requires considerable assistance and frequent medical care. 78   - Disabled; requires special care and assistance. 58   - Severely disabled; hospital admission is indicated although death not imminent. 80   - Very sick; hospital admission necessary; active supportive treatment necessary. 10   - Moribund; fatal processes progressing rapidly. 0     - Dead  Karnofsky DA, Abelmann [place], Craver LS and Burchenal [facility] [phone]) The use of the nitrogen mustards in the palliative treatment of carcinoma: with particular reference to bronchogenic carcinoma Cancer 1 634-56  LABORATORY DATA:  Lab Results  Component Value Date   WBC 8.3 08/07/2015   HGB 13.7 08/07/2015   HCT 42.3 08/07/2015   MCV 114.9 (H) 08/07/2015   PLT 519 (H) 08/07/2015   Lab Results  Component Value Date   NA 135 08/07/2015   K 4.8 08/07/2015   CL 98 (L) 08/07/2015   CO2 28 08/07/2015   No results found for: ALT, AST, GGT, ALKPHOS, BILITOT  PULMONARY  FUNCTION TEST:  Pulmonary Functions Testing Results:  TLC  Date Value Ref Range Status  05/17/2013 4.52 L Final    RADIOGRAPHY: Nm Pet Image Initial (pi) Skull Base To Thigh  Result Date: 01/18/2016 CLINICAL DATA:  Initial treatment strategy for lung mass. EXAM: NUCLEAR MEDICINE PET SKULL BASE TO THIGH TECHNIQUE: 6.0 mCi F-18 FDG was injected intravenously. Full-ring PET imaging was performed from the skull base to thigh after the radiotracer. CT data was obtained and used for attenuation correction and anatomic localization. FASTING BLOOD GLUCOSE:  Value: 115 mg/dl COMPARISON:  CT chest 01/03/2016 FINDINGS: NECK No hypermetabolic lymph nodes in the neck. CHEST Spiculated left lower lobe pulmonary lesion is hypermetabolic with SUV max = 5.1. Previously characterized AP window lymph node is hypermetabolic with SUV max = 4.4. Central hypermetabolic right axillary lymph node is hypermetabolic with SUV max = 6.2. Other hypermetabolic lymph nodes are identified in the hilar regions bilaterally. The heart is enlarged. Coronary artery and thoracic aortic atherosclerosis are evident. Diffuse emphysematous change noted in the  lungs. Right apical scarring again noted. ABDOMEN/PELVIS Layering tiny calcified stones are seen in the lumen of the gallbladder. There is a large right upper quadrant diverticulum, presumably duodenal in origin. Atherosclerotic calcification noted in the wall of the abdominal aorta without aneurysm. Left colonic diverticulosis without diverticulitis. SKELETON No focal hypermetabolic activity to suggest skeletal metastasis. IMPRESSION: 1. Left lower lobe pulmonary nodule is hypermetabolic and is associated with hypermetabolic lymph nodes in the AP window, precarinal station, and both hilar regions. 2. Emphysema. 3. Coronary artery and thoracoabdominal aortic atherosclerosis. 4. Cholelithiasis. Electronically Signed   By: Misty Stanley M.D.   On: 01/18/2016 15:51      IMPRESSION: 83 yo woman  with hypermetabolic RLL nodule and bilateral mediastinal nodes pending biopsy  PLAN: Today, I talked to the patient and family about the findings and work-up thus far.  We discussed the natural history of both early stage and stage IIIB lung cancer and general treatment, highlighting the role of radiotherapy in the management.  We discussed the available radiation techniques, and focused on the details of logistics and delivery.  We reviewed the anticipated acute and late sequelae associated with radiation in this setting.  The patient was encouraged to ask questions that I answered to the best of my ability.  The patient will likely undergo evaluation with thoracic surgery for tissue diagnosis and staging. We will follow up on those results and refine treatment recommendations.  I spent 30 minutes minutes face to face with the patient and more than 50% of that time was spent in counseling and/or coordination of care.    ------------------------------------------------  Sheral Apley. Tammi Klippel, M.D.  This document serves as a record of services personally performed by Tyler Pita, MD. It was created on his behalf by Bethann Humble, a trained medical scribe. The creation of this record is based on the scribe's personal observations and the provider's statements to them. This document has been checked and approved by the attending provider.

## 2016-02-07 NOTE — Therapy (Signed)
[place], [place], [address] Phone: [phone]   Fax:  [phone]  Physical Therapy Evaluation  Patient Details  Name: Kathy Pierce MRN: [id number] Date of Birth: 11-22-1925 Referring Provider: Dr. Curt Bears  Encounter Date: 02/07/2016      PT End of Session - 02/07/16 1658    Visit Number 1   Number of Visits 1   PT Start Time 1424   PT Stop Time 1450   PT Time Calculation (min) 26 min   Activity Tolerance Patient tolerated treatment well   Behavior During Therapy [facility] for tasks assessed/performed      Past Medical History:  Diagnosis Date  . Cancer (HCC)    Squamous cell carcinoma, skin, basal cell carcinoma  . COPD (chronic obstructive pulmonary disease) ([place])   . Depression   . Essential thrombocythemia ([place])   . Hypertension   . Osteoporosis   . Sciatica of left side 06/16/2012  . Spinal stenosis of lumbar region 06/16/2012  . Vitamin B12 deficiency     Past Surgical History:  Procedure Laterality Date  . APPENDECTOMY    . CATARACT EXTRACTION, BILATERAL Bilateral   . STAPEDECTOMY Right   . TONSILLECTOMY      There were no vitals filed for this visit.       Subjective Assessment - 02/07/16 1610    Subjective Says she has vertigo that limits her ability to bend forward and backward.   Patient is accompained by: Family member  daughter and granddaughter   Pertinent History Incidental finding of left pulmonary nodule on pre-op chest x-ray (op was to be for back surgery).  Has had CT and PET scans; no biopsy for pathology yet but has hypermetabolic nodes bilaterally so is probably stage IIIB lung cancer.  Needs biopsy next.  Ex-smoker who quit 1978. She has vertigo and says she manages it with Meclazine and avoiding bending.   Patient Stated Goals get info from all lung clinic providers   Currently in Pain? No/denies            [facility] PT Assessment - 02/07/16 0001      Assessment   Medical Diagnosis left lung mass with hypermetabolic nodes bilat. on PET; no pathology yet   Referring Provider Dr. Curt Bears   Onset Date/Surgical Date 01/03/16   Prior Therapy none     Precautions   Precautions Other (comment)   Precaution Comments cancer precautions     Restrictions   Weight Bearing Restrictions No     Balance Screen   Has the patient fallen in the past 6 months Yes   How many times? 1  a fluke--got caught up on a dining room chair   Has the patient had a decrease in activity level because of a fear of falling?  No  but she is careful   Is the patient reluctant to leave their home because of a fear of falling?  No     Home Environment   Living Environment Private residence   Living Arrangements Alone   Type of [place] to enter   Entrance Stairs-Number of Steps 2  leans on wall, holds doorknob for support for stairs   Home Layout One level     Prior Function   Level of Independence Independent   Leisure had been walking for exercise, but not lately with recent problems, because her legs get tired and breathing is difficult; only tolerates 20  minutes now and the weather limits her     Cognition   Overall Cognitive Status Within Functional Limits for tasks assessed     Observation/Other Assessments   Observations thin woman surrounded by daughter and granddaughter, engaged in conversation     Functional Tests   Functional tests Sit to Stand     Sit to Stand   Comments 10 times in 30 seconds, with moderate shortness of breath  above average for age     Posture/Postural Control   Posture/Postural Control Postural limitations   Postural Limitations --  scoliosis with left lean     ROM / Strength   AROM / PROM / Strength AROM     AROM   Overall AROM Comments standing AROM:  unable to do flexion and extension, explaining she would fall due to her vertigo; sidebend limited 25% bilat. and rotation limited  50% bilat.     Ambulation/Gait   Ambulation/Gait Yes   Ambulation/Gait Assistance 6: Modified independent (Device/Increase time)   Stairs --  reports stairs are challenging     Balance   Balance Assessed Yes     Dynamic Standing Balance   Dynamic Standing - Comments reaches forward about 8 inches in standing                           PT Education - 02/07/16 1657    Education provided Yes   Education Details walking, Cure article on staying active, posture, breathing, PT info, energy conservation   Person(s) Educated Patient;Child(ren);Other (comment)  grandchild   Methods Explanation;Handout   Comprehension Verbalized understanding               Lung Clinic Goals - 02/07/16 1706      Patient will be able to verbalize understanding of the benefit of exercise to decrease fatigue.   Status Achieved     Patient will be able to verbalize the importance of posture.   Status Achieved     Patient will be able to demonstrate diaphragmatic breathing for improved lung function.   Status Achieved     Patient will be able to verbalize understanding of the role of physical therapy to prevent functional decline and who to contact if physical therapy is needed.   Status Achieved             Plan - 02/07/16 1659    Clinical Impression Statement Pleasant older woman with probably stage IIIB lung cancer, but without pathology confirming that as yet.  She will have biopsy, then treatment will be decided.  She has scoliosis and limited trunk ROM.  She has had decreased endurance for walking recently.  Eval is moderate complexity due to comorbidity of vertigo that limits function and evolving diagnosis and treatment plan.   Rehab Potential Good   PT Frequency One time visit   PT Treatment/Interventions Patient/family education   PT Next Visit Plan None at this time.   PT Home Exercise Plan walking; breathing exercise   Consulted and Agree with Plan of Care  Patient      Patient will benefit from skilled therapeutic intervention in order to improve the following deficits and impairments:  Postural dysfunction, Decreased range of motion, Cardiopulmonary status limiting activity  Visit Diagnosis: Abnormal posture - Plan: PT plan of care cert/re-cert  Unsteadiness on feet - Plan: PT plan of care cert/re-cert  Other symptoms and signs involving the musculoskeletal system - Plan: PT plan of care cert/re-cert  G-Codes - 02/07/16 1706    Functional Assessment Tool Used clinical judgement   Functional Limitation Mobility: Walking and moving around   Mobility: Walking and Moving Around Current Status [phone]) At least 20 percent but less than 40 percent impaired, limited or restricted   Mobility: Walking and Moving Around Goal Status [phone]) At least 20 percent but less than 40 percent impaired, limited or restricted   Mobility: Walking and Moving Around Discharge Status [phone]) At least 20 percent but less than 40 percent impaired, limited or restricted       Problem List Patient Active Problem List   Diagnosis Date Noted  . Lung nodule 02/07/2016  . Conductive hearing loss in right ear 04/25/2015  . Cerumen impaction 04/25/2015  . Sciatica of left side 06/16/2012  . Spinal stenosis of lumbar region 06/16/2012    Nicola Quesnell 02/07/2016, 5:08 PM  [place], [place], [address] Phone: [phone]   Fax:  [phone]  Name: Kathy Pierce MRN: [id number] Date of Birth: 1925/07/05  Serafina Royals, PT 02/07/16 5:09 PM

## 2016-02-08 ENCOUNTER — Telehealth: Payer: Self-pay | Admitting: *Deleted

## 2016-02-08 NOTE — Telephone Encounter (Signed)
Oncology Nurse Navigator Documentation  Oncology Nurse Navigator Flowsheets 02/08/2016  Navigator Location [facility]  Navigator Encounter Type Clinic/MDC;Telephone/spoke with patient and daughters yesterday.  Gave and explained information on follow up appt with Dr. [name] and gave them a map of location.  I called today to give patient a follow up appt with Dr. Julien Nordmann. I was unable to reach and left a vm message to call.   Telephone Outgoing Call  Abnormal Finding Date 08/07/2015  Multidisiplinary Clinic Date 02/07/2016  Patient Visit Type MedOnc  Treatment Phase Pre-Tx/Tx Discussion  Barriers/Navigation Needs Coordination of Care;Education  Education Other  Interventions Coordination of Care;Education  Coordination of Care Appts  Education Method Verbal;Written  Acuity Level 2  Acuity Level 2 Educational needs;Assistance expediting appointments  Time Spent with Patient 62

## 2016-02-08 NOTE — Progress Notes (Signed)
I was unable to reach patient by phone. I also called her contacts and did not reach anyone. I left  A message on voice mail.  I instructed the patient to arrive at [place] entrance at 10:00AM  , nothing to eat or drink after midnight.   I instructed the patient to take the following medications in the am with just enough water to get them down: Atenolol, Zoloft., use Breo Inhaler, ; if needed take Tramadol, Antivert, Tessolon. I asked patient to not wear any lotions, powders, cologne, jewelry, piercing, make-up or nail polish.  I asked the patient to call [phone], in the am if there were any questions or problems.  I informed patient that she needs a driver and someone to stay with her the first 24 hours after surgery.

## 2016-02-09 ENCOUNTER — Encounter: Payer: Self-pay | Admitting: Internal Medicine

## 2016-02-10 ENCOUNTER — Encounter: Payer: Self-pay | Admitting: Cardiothoracic Surgery

## 2016-02-11 ENCOUNTER — Ambulatory Visit (HOSPITAL_COMMUNITY): Payer: Medicare Other

## 2016-02-11 ENCOUNTER — Encounter (HOSPITAL_COMMUNITY)
Admission: RE | Disposition: A | Discharge status: Home / Self Care | Payer: Self-pay | Source: Ambulatory Visit | Attending: Cardiothoracic Surgery

## 2016-02-11 ENCOUNTER — Ambulatory Visit (HOSPITAL_COMMUNITY): Payer: Medicare Other | Admitting: Anesthesiology

## 2016-02-11 ENCOUNTER — Ambulatory Visit (HOSPITAL_COMMUNITY)
Admission: RE | Admit: 2016-02-11 | Discharge: 2016-02-11 | Disposition: A | Discharge status: Home / Self Care | Payer: Medicare Other | Source: Ambulatory Visit | Attending: Cardiothoracic Surgery | Admitting: Cardiothoracic Surgery

## 2016-02-11 ENCOUNTER — Encounter (HOSPITAL_COMMUNITY): Payer: Self-pay

## 2016-02-11 DIAGNOSIS — F329 Major depressive disorder, single episode, unspecified: Secondary | ICD-10-CM | POA: Diagnosis not present

## 2016-02-11 DIAGNOSIS — I700 Atherosclerosis of aorta: Secondary | ICD-10-CM | POA: Diagnosis not present

## 2016-02-11 DIAGNOSIS — R918 Other nonspecific abnormal finding of lung field: Secondary | ICD-10-CM | POA: Diagnosis not present

## 2016-02-11 DIAGNOSIS — Z09 Encounter for follow-up examination after completed treatment for conditions other than malignant neoplasm: Secondary | ICD-10-CM

## 2016-02-11 DIAGNOSIS — J449 Chronic obstructive pulmonary disease, unspecified: Secondary | ICD-10-CM | POA: Insufficient documentation

## 2016-02-11 DIAGNOSIS — Z85828 Personal history of other malignant neoplasm of skin: Secondary | ICD-10-CM | POA: Insufficient documentation

## 2016-02-11 DIAGNOSIS — K5710 Diverticulosis of small intestine without perforation or abscess without bleeding: Secondary | ICD-10-CM | POA: Diagnosis not present

## 2016-02-11 DIAGNOSIS — R911 Solitary pulmonary nodule: Secondary | ICD-10-CM | POA: Diagnosis not present

## 2016-02-11 DIAGNOSIS — K8020 Calculus of gallbladder without cholecystitis without obstruction: Secondary | ICD-10-CM | POA: Diagnosis not present

## 2016-02-11 DIAGNOSIS — R222 Localized swelling, mass and lump, trunk: Secondary | ICD-10-CM | POA: Diagnosis not present

## 2016-02-11 DIAGNOSIS — D473 Essential (hemorrhagic) thrombocythemia: Secondary | ICD-10-CM | POA: Insufficient documentation

## 2016-02-11 DIAGNOSIS — Z880 Allergy status to penicillin: Secondary | ICD-10-CM | POA: Diagnosis not present

## 2016-02-11 DIAGNOSIS — I10 Essential (primary) hypertension: Secondary | ICD-10-CM | POA: Diagnosis not present

## 2016-02-11 DIAGNOSIS — Z87891 Personal history of nicotine dependence: Secondary | ICD-10-CM | POA: Insufficient documentation

## 2016-02-11 DIAGNOSIS — I119 Hypertensive heart disease without heart failure: Secondary | ICD-10-CM | POA: Insufficient documentation

## 2016-02-11 DIAGNOSIS — J60 Coalworker's pneumoconiosis: Secondary | ICD-10-CM | POA: Insufficient documentation

## 2016-02-11 DIAGNOSIS — J939 Pneumothorax, unspecified: Secondary | ICD-10-CM | POA: Diagnosis not present

## 2016-02-11 DIAGNOSIS — Z885 Allergy status to narcotic agent status: Secondary | ICD-10-CM | POA: Insufficient documentation

## 2016-02-11 DIAGNOSIS — Z882 Allergy status to sulfonamides status: Secondary | ICD-10-CM | POA: Insufficient documentation

## 2016-02-11 DIAGNOSIS — I517 Cardiomegaly: Secondary | ICD-10-CM | POA: Diagnosis not present

## 2016-02-11 DIAGNOSIS — M48061 Spinal stenosis, lumbar region without neurogenic claudication: Secondary | ICD-10-CM | POA: Insufficient documentation

## 2016-02-11 HISTORY — PX: VIDEO BRONCHOSCOPY WITH ENDOBRONCHIAL ULTRASOUND: SHX6177

## 2016-02-11 HISTORY — DX: Adverse effect of unspecified anesthetic, initial encounter: T41.45XA

## 2016-02-11 HISTORY — DX: Other complications of anesthesia, initial encounter: T88.59XA

## 2016-02-11 HISTORY — PX: VIDEO BRONCHOSCOPY WITH ENDOBRONCHIAL NAVIGATION: SHX6175

## 2016-02-11 LAB — COMPREHENSIVE METABOLIC PANEL
ALT: 13 U/L — ABNORMAL LOW (ref 14–54)
AST: 25 U/L (ref 15–41)
Albumin: 3 g/dL — ABNORMAL LOW (ref 3.5–5.0)
Alkaline Phosphatase: 58 U/L (ref 38–126)
Anion gap: 11 (ref 5–15)
BUN: 26 mg/dL — ABNORMAL HIGH (ref 6–20)
CO2: 26 mmol/L (ref 22–32)
Calcium: 9.4 mg/dL (ref 8.9–10.3)
Chloride: 102 mmol/L (ref 101–111)
Creatinine, Ser: 0.65 mg/dL (ref 0.44–1.00)
GFR calc Af Amer: >60 mL/min (ref >60)
GFR calc non Af Amer: >60 mL/min (ref >60)
Glucose, Bld: 102 mg/dL — ABNORMAL HIGH (ref 65–99)
Potassium: 3.9 mmol/L (ref 3.5–5.1)
Sodium: 139 mmol/L (ref 135–145)
Total Bilirubin: 0.7 mg/dL (ref 0.3–1.2)
Total Protein: 6.9 g/dL (ref 6.5–8.1)

## 2016-02-11 LAB — CBC
HCT: 40.7 % (ref 36.0–46.0)
Hemoglobin: 13.2 g/dL (ref 12.0–15.0)
MCH: 35.9 pg — ABNORMAL HIGH (ref 26.0–34.0)
MCHC: 32.4 g/dL (ref 30.0–36.0)
MCV: 110.6 fL — ABNORMAL HIGH (ref 78.0–100.0)
Platelets: 525 10*3/uL — ABNORMAL HIGH (ref 150–400)
RBC: 3.68 MIL/uL — ABNORMAL LOW (ref 3.87–5.11)
RDW: 14.4 % (ref 11.5–15.5)
WBC: 9.2 10*3/uL (ref 4.0–10.5)

## 2016-02-11 LAB — APTT: aPTT: 32 seconds (ref 24–36)

## 2016-02-11 LAB — PROTIME-INR
INR: 0.97
Prothrombin Time: 12.9 seconds (ref 11.4–15.2)

## 2016-02-11 SURGERY — BRONCHOSCOPY, WITH EBUS
Anesthesia: General

## 2016-02-11 MED ORDER — FENTANYL CITRATE (PF) 100 MCG/2ML IJ SOLN
INTRAMUSCULAR | Status: DC | PRN
  Administered 2016-02-11 (×2): 50 ug via INTRAVENOUS

## 2016-02-11 MED ORDER — ATROPINE SULFATE 0.4 MG/ML IJ SOLN: INTRAMUSCULAR | Status: DC | PRN

## 2016-02-11 MED ORDER — FENTANYL CITRATE (PF) 100 MCG/2ML IJ SOLN: 25.0000 ug | INTRAMUSCULAR | Status: DC | PRN

## 2016-02-11 MED ORDER — LIDOCAINE 2% (20 MG/ML) 5 ML SYRINGE
INTRAMUSCULAR | Status: AC
  Filled 2016-02-11: qty 5

## 2016-02-11 MED ORDER — ROCURONIUM BROMIDE 100 MG/10ML IV SOLN
INTRAVENOUS | Status: DC | PRN
  Administered 2016-02-11: 50 mg via INTRAVENOUS
  Administered 2016-02-11: 20 mg via INTRAVENOUS

## 2016-02-11 MED ORDER — ROCURONIUM BROMIDE 50 MG/5ML IV SOSY
PREFILLED_SYRINGE | INTRAVENOUS | Status: AC
  Filled 2016-02-11: qty 5

## 2016-02-11 MED ORDER — PROPOFOL 10 MG/ML IV BOLUS
INTRAVENOUS | Status: DC | PRN
  Administered 2016-02-11: 100 mg via INTRAVENOUS

## 2016-02-11 MED ORDER — FENTANYL CITRATE (PF) 100 MCG/2ML IJ SOLN
INTRAMUSCULAR | Status: AC
  Filled 2016-02-11: qty 2

## 2016-02-11 MED ORDER — PHENYLEPHRINE HCL 10 MG/ML IJ SOLN
INTRAMUSCULAR | Status: DC | PRN
  Administered 2016-02-11: 25 ug/min via INTRAVENOUS

## 2016-02-11 MED ORDER — SUGAMMADEX SODIUM 200 MG/2ML IV SOLN
INTRAVENOUS | Status: DC | PRN
  Administered 2016-02-11: 100 mg via INTRAVENOUS

## 2016-02-11 MED ORDER — ONDANSETRON HCL 4 MG/2ML IJ SOLN
INTRAMUSCULAR | Status: DC | PRN
  Administered 2016-02-11: 4 mg via INTRAVENOUS

## 2016-02-11 MED ORDER — PROPOFOL 10 MG/ML IV BOLUS
INTRAVENOUS | Status: AC
  Filled 2016-02-11: qty 20

## 2016-02-11 MED ORDER — LACTATED RINGERS IV SOLN
INTRAVENOUS | Status: DC
  Administered 2016-02-11: 12:00:00 via INTRAVENOUS

## 2016-02-11 MED ORDER — 0.9 % SODIUM CHLORIDE (POUR BTL) OPTIME
TOPICAL | Status: DC | PRN
  Administered 2016-02-11: 1000 mL

## 2016-02-11 MED ORDER — LIDOCAINE HCL (CARDIAC) 20 MG/ML IV SOLN
INTRAVENOUS | Status: DC | PRN
  Administered 2016-02-11: 40 mg via INTRAVENOUS

## 2016-02-11 MED ORDER — GLYCOPYRROLATE 0.2 MG/ML IJ SOLN
INTRAMUSCULAR | Status: DC | PRN
  Administered 2016-02-11: .2 mg via INTRAVENOUS

## 2016-02-11 SURGICAL SUPPLY — 45 items
ADAPTER BRONCH F/PENTAX (ADAPTER) ×3 IMPLANT
ADPR BSCP EDG PNTX (ADAPTER) ×2
BRUSH CYTOL CELLEBRITY 1.5X140 (MISCELLANEOUS) ×1 IMPLANT
BRUSH SUPERTRAX BIOPSY (INSTRUMENTS) IMPLANT
BRUSH SUPERTRAX NDL-TIP CYTO (INSTRUMENTS) ×1 IMPLANT
CANISTER SUCTION 2500CC (MISCELLANEOUS) ×6 IMPLANT
CHANNEL WORK EXTEND EDGE 180 (KITS) IMPLANT
CHANNEL WORK EXTEND EDGE 45 (KITS) IMPLANT
CHANNEL WORK EXTEND EDGE 90 (KITS) IMPLANT
CONT SPEC 4OZ CLIKSEAL STRL BL (MISCELLANEOUS) ×9 IMPLANT
COVER DOME SNAP 22 D (MISCELLANEOUS) ×3 IMPLANT
COVER TABLE BACK 60X90 (DRAPES) ×6 IMPLANT
DRSG AQUACEL AG ADV 3.5X14 (GAUZE/BANDAGES/DRESSINGS) ×3 IMPLANT
FILTER STRAW FLUID ASPIR (MISCELLANEOUS) IMPLANT
FORCEPS BIOP RJ4 1.8 (CUTTING FORCEPS) IMPLANT
FORCEPS BIOP SUPERTRX PREMAR (INSTRUMENTS) ×1 IMPLANT
GAUZE SPONGE 4X4 12PLY STRL (GAUZE/BANDAGES/DRESSINGS) ×6 IMPLANT
GLOVE BIO SURGEON STRL SZ 6.5 (GLOVE) ×8 IMPLANT
KIT CLEAN ENDO COMPLIANCE (KITS) ×12 IMPLANT
KIT MARKER FIDUCIAL DELIVERY (KITS) ×1 IMPLANT
KIT PROCEDURE EDGE 180 (KITS) ×1 IMPLANT
KIT PROCEDURE EDGE 45 (KITS) IMPLANT
KIT PROCEDURE EDGE 90 (KITS) IMPLANT
KIT ROOM TURNOVER OR (KITS) ×6 IMPLANT
MARKER FIDUCIAL SL NIT COIL (Implant Marker) ×3 IMPLANT
MARKER SKIN DUAL TIP RULER LAB (MISCELLANEOUS) ×5 IMPLANT
NDL BIOPSY TRANSBRONCH 21G (NEEDLE) IMPLANT
NDL BLUNT 18X1 FOR OR ONLY (NEEDLE) IMPLANT
NDL EBUS SONO TIP PENTAX (NEEDLE) ×2 IMPLANT
NDL SUPERTRX PREMARK BIOPSY (NEEDLE) IMPLANT
NEEDLE BIOPSY TRANSBRONCH 21G (NEEDLE) IMPLANT
NEEDLE BLUNT 18X1 FOR OR ONLY (NEEDLE) IMPLANT
NEEDLE EBUS SONO TIP PENTAX (NEEDLE) ×6 IMPLANT
NEEDLE SUPERTRX PREMARK BIOPSY (NEEDLE) IMPLANT
NS IRRIG 1000ML POUR BTL (IV SOLUTION) ×6 IMPLANT
OIL SILICONE PENTAX (PARTS (SERVICE/REPAIRS)) ×5 IMPLANT
PAD ARMBOARD 7.5X6 YLW CONV (MISCELLANEOUS) ×12 IMPLANT
PATCHES PATIENT (LABEL) ×9 IMPLANT
SYR 20CC LL (SYRINGE) ×3 IMPLANT
SYR 20ML ECCENTRIC (SYRINGE) ×6 IMPLANT
TOWEL OR 17X24 6PK STRL BLUE (TOWEL DISPOSABLE) ×6 IMPLANT
TRAP SPECIMEN MUCOUS 40CC (MISCELLANEOUS) ×5 IMPLANT
TUBE CONNECTING 20X1/4 (TUBING) ×6 IMPLANT
UNDERPAD 30X30 (UNDERPADS AND DIAPERS) ×3 IMPLANT
WATER STERILE IRR 1000ML POUR (IV SOLUTION) ×6 IMPLANT

## 2016-02-11 NOTE — H&P (Signed)
[address]       [place] [address]             [phone]                    Kathy Pierce [place] Medical Record #[id number] Date of Birth: February 07, 1925  Referring: [name], MD Primary Care: [name], MD  Chief Complaint:  Lung Mass   History of Present Illness:    Kathy Pierce 83 y.o. female is seen in the office  today for abnormal chest xray and CT of the chest. She was dx with COPD in 2015 by Dr Glynn Octave. She was to have back surgery in July 2017, a preop chest xray was done but she decided against surgery. The chest xray was abnormal and CT of chest was recommended.  She is a former smoker , quit 30 years ago   She denies , cough, hemoptysis or recent respiratory infection    Current Activity/ Functional Status:  Patient is independent with mobility/ambulation, transfers, ADL's, IADL's.   Zubrod Score: At the time of surgery this patient's most appropriate activity status/level should be described as: '[]'$     0    Normal activity, no symptoms '[x]'$     1    Restricted in physical strenuous activity but ambulatory, able to do out light work '[]'$     2    Ambulatory and capable of self care, unable to do work activities, up and about               >50 % of waking hours                              '[]'$     3    Only limited self care, in bed greater than 50% of waking hours '[]'$     4    Completely disabled, no self care, confined to bed or chair '[]'$     5    Moribund   Past Medical History:  Diagnosis Date  . Cancer (HCC)    Squamous cell carcinoma, skin, basal cell carcinoma  . Complication of anesthesia    wakes up slowly   . COPD (chronic obstructive pulmonary disease) ([facility])   . Depression   . Essential thrombocythemia ([place])   . Hypertension   . Osteoporosis   . Sciatica of left side 06/16/2012  . Spinal stenosis of lumbar region 06/16/2012  . Vitamin B12 deficiency     Past Surgical History:  Procedure Laterality Date  .  APPENDECTOMY    . CATARACT EXTRACTION, BILATERAL Bilateral   . STAPEDECTOMY Right   . TONSILLECTOMY      Family History  Problem Relation Age of Onset  . Heart attack Father   . Cancer Mother     Social History   Social History  . Marital status: Widowed    Spouse name: N/A  . Number of children: N/A  . Years of education: N/A    Social History Main Topics  . Smoking status: Former Smoker    Quit date: 07/10/1976  . Smokeless tobacco: Not on file  . Alcohol use No  . Drug use: Unknown  . Sexual activity: Not on file      History  Smoking Status  . Former Smoker  . Quit date: 07/10/1976  Smokeless Tobacco  . Not on file    History  Alcohol Use No     Allergies  Allergen Reactions  . Penicillins Hives and Rash  . Codeine Nausea And Vomiting  . Erythromycin Nausea And Vomiting  . Sulfa Drugs Cross Reactors Nausea And Vomiting    Current Facility-Administered Medications  Medication Dose Route Frequency Provider Last Rate Last Dose  . lactated ringers infusion   Intravenous Continuous Roderic Palau, MD 50 mL/hr at 02/11/16 1208        Review of Systems:     Cardiac Review of Systems: Y or N  Chest Pain [  n  ]  Resting SOB [n   ] Exertional SOB  [  y]  Orthopnea [ n ]   Pedal Edema [  n ]    Palpitations [ n ] Syncope  [ n ]   Presyncope [  n ]  General Review of Systems: [Y] = yes [  ]=no Constitional: recent weight change [ n ];  Wt loss over the last 3 months [   ] anorexia [  ]; fatigue [  ]; nausea [  ]; night sweats [  ]; fever [  ]; or chills [  ];          Dental: poor dentition[  ]; Last Dentist visit:   Eye : blurred vision [  ]; diplopia [   ]; vision changes [  ];  Amaurosis fugax[  ]; Resp: cough [ n ];  wheezing[ n ];  hemoptysis[ n ]; shortness of breath[y  ]; paroxysmal nocturnal dyspnea[n  ]; dyspnea on exertion[y  ]; or orthopnean[  ];  GI:  gallstones[  ], vomiting[  ];  dysphagia[  ]; melena[  ];  hematochezia [  ]; heartburn[  ];    Hx of  Colonoscopy[  ]; GU: kidney stones [  ]; hematuria[  ];   dysuria [  ];  nocturia[  ];  history of     obstruction [  ]; urinary frequency [  ]             Skin: rash, swelling[  ];, hair loss[  ];  peripheral edema[  ];  or itching[  ]; Musculosketetal: myalgias[  ];  joint swelling[  ];  joint erythema[  ];  joint pain[  ];  back pain[  ];  Heme/Lymph: bruising[  ];  bleeding[  ];  anemia[  ];  Neuro: TIA[n  ];  headaches[  ];  stroke[  ];  vertigo[  ];  seizures[  ];   paresthesias[  ];  difficulty walking[  ];  Psych:depression[n  ]; anxiety[n  ];  Endocrine: diabetes[  ];  thyroid dysfunction[  ];  Immunizations: Flu up to date [ unknown  ]; Pneumococcal up to date [unknown  ];  Other:  Physical Exam: BP (!) 150/66   Pulse 68   Temp 97.7 F (36.5 C) (Oral)   Resp 16   Ht '5\' 4"'$  (1.626 m)   Wt 113 lb (51.3 kg)   SpO2 97%   BMI 19.40 kg/m    PHYSICAL EXAMINATION: General appearance: alert, cooperative, appears stated age and no distress Head: Normocephalic, without obvious abnormality, atraumatic Neck: no adenopathy, no carotid bruit, no JVD, supple, symmetrical, trachea midline and thyroid not enlarged, symmetric, no tenderness/mass/nodules Lymph nodes: Cervical, supraclavicular, and axillary nodes normal. Resp: diminished breath sounds bibasilar Back: symmetric, no curvature. ROM normal. No CVA tenderness. Cardio: regular rate and rhythm, S1, S2 normal, no murmur, click, rub or gallop GI: soft,  non-tender; bowel sounds normal; no masses,  no organomegaly Extremities: extremities normal, atraumatic, no cyanosis or edema and Homans sign is negative, no sign of DVT Neurologic: Grossly normal  Diagnostic Studies & Laboratory data:     Recent Radiology Findings:   Nm Pet Image Initial (pi) Skull Base To Thigh  Result Date: 01/18/2016 CLINICAL DATA:  Initial treatment strategy for lung mass. EXAM: NUCLEAR MEDICINE PET SKULL BASE TO THIGH TECHNIQUE: 6.0 mCi F-18 FDG  was injected intravenously. Full-ring PET imaging was performed from the skull base to thigh after the radiotracer. CT data was obtained and used for attenuation correction and anatomic localization. FASTING BLOOD GLUCOSE:  Value: 115 mg/dl COMPARISON:  CT chest 01/03/2016 FINDINGS: NECK No hypermetabolic lymph nodes in the neck. CHEST Spiculated left lower lobe pulmonary lesion is hypermetabolic with SUV max = 5.1. Previously characterized AP window lymph node is hypermetabolic with SUV max = 4.4. Central hypermetabolic right axillary lymph node is hypermetabolic with SUV max = 6.2. Other hypermetabolic lymph nodes are identified in the hilar regions bilaterally. The heart is enlarged. Coronary artery and thoracic aortic atherosclerosis are evident. Diffuse emphysematous change noted in the lungs. Right apical scarring again noted. ABDOMEN/PELVIS Layering tiny calcified stones are seen in the lumen of the gallbladder. There is a large right upper quadrant diverticulum, presumably duodenal in origin. Atherosclerotic calcification noted in the wall of the abdominal aorta without aneurysm. Left colonic diverticulosis without diverticulitis. SKELETON No focal hypermetabolic activity to suggest skeletal metastasis. IMPRESSION: 1. Left lower lobe pulmonary nodule is hypermetabolic and is associated with hypermetabolic lymph nodes in the AP window, precarinal station, and both hilar regions. 2. Emphysema. 3. Coronary artery and thoracoabdominal aortic atherosclerosis. 4. Cholelithiasis. Electronically Signed   By: Misty Stanley M.D.   On: 01/18/2016 15:51   I have independently reviewed the above radiology studies  and reviewed the findings with the patient.   Recent Lab Findings: Lab Results  Component Value Date   WBC 9.2 02/11/2016   HGB 13.2 02/11/2016   HCT 40.7 02/11/2016   PLT 525 (H) 02/11/2016   GLUCOSE 102 (H) 02/11/2016   ALT 13 (L) 02/11/2016   AST 25 02/11/2016   NA 139 02/11/2016   K 3.9  02/11/2016   CL 102 02/11/2016   CREATININE 0.65 02/11/2016   BUN 26 (H) 02/11/2016   CO2 26 02/11/2016   INR 0.97 02/11/2016      Assessment / Plan:   Left lower lobe lung lesion consistent with carcinoma of the lung on ct and pet , PET suggests at least stage III ( in mediastinal nodes are positive.   AP window lymph node is hypermetabolic  Central hypermetabolic right axillary lymph node is hypermetabolic with SUV max = 6.2.   hypermetabolic lymph nodes are identified in the hilar regions bilaterally  Gall Stones  Duodenal  Diverticulum-  large right upper quadrant ,  Atherosclerotic calcification noted in the wall of the abdominal aorta without aneurysm.    I have discussed with the patient proceeding with Bronch, EBUS and navigation biopsy to obtain tissue dx and tissue staging of mediastinal nodes  The goals risks and alternatives of the planned surgical procedure Bronchoscopy, EBUS, ENB with biopsy and possible marker placement   have been discussed with the patient in detail. The risks of the procedure including death, infection, stroke, myocardial infarction, bleeding, blood transfusion have all been discussed specifically.  I have quoted [name] a 2 % of perioperative mortality and a complication  rate as high as 30 %. The patient's questions have been answered.RODNEISHA BONNET is willing  to proceed with the planned procedure.  Grace Isaac MD      [address] [place] [address] Office [phone]   Beeper [phone]  02/11/2016 1:17 PM

## 2016-02-11 NOTE — Anesthesia Procedure Notes (Signed)
Procedure Name: Intubation Date/Time: 02/11/2016 1:45 PM Performed by: Kyung Rudd Pre-anesthesia Checklist: Patient identified, Emergency Drugs available, Suction available and Patient being monitored Patient Re-evaluated:Patient Re-evaluated prior to inductionOxygen Delivery Method: Circle system utilized Preoxygenation: Pre-oxygenation with 100% oxygen Intubation Type: IV induction Ventilation: Mask ventilation without difficulty Laryngoscope Size: Mac and 3 Grade View: Grade I Tube type: Oral Tube size: 8.5 mm Number of attempts: 1 Airway Equipment and Method: Stylet Placement Confirmation: ETT inserted through vocal cords under direct vision,  positive ETCO2 and breath sounds checked- equal and bilateral Secured at: 21 cm Tube secured with: Tape Dental Injury: Teeth and Oropharynx as per pre-operative assessment

## 2016-02-11 NOTE — Brief Op Note (Addendum)
      [address]       [place] [address]             [phone]     02/11/2016  4:30 PM  PATIENT:  Kathy Pierce  83 y.o. female  PRE-OPERATIVE DIAGNOSIS:  LLL NODULE MEDIASTINAL NODES  POST-OPERATIVE DIAGNOSIS:  Left lower lung nodule  PROCEDURE:  Procedure(s): VIDEO BRONCHOSCOPY WITH ENDOBRONCHIAL ULTRASOUND with biopsy of nodes #7 and #10 (N/A) VIDEO BRONCHOSCOPY WITH ENDOBRONCHIAL NAVIGATION (N/A) with biopsy of left lower lobe lesion PLACEMENT OF FIDUCIAL Markers times three   SURGEON:  Surgeon(s) and Role:    * Grace Isaac, MD - Primary   ANESTHESIA:   none  EBL:  Total I/O In: 700 [I.V.:700] Out: -   BLOOD ADMINISTERED:none  DRAINS: none   LOCAL MEDICATIONS USED:  NONE  SPECIMEN:  Source of Specimen:  # 7, # 10R and left lower lobe lung lesion   DISPOSITION OF SPECIMEN:  PATHOLOGY  COUNTS:  YES   DICTATION: .Dragon Dictation  PLAN OF CARE: Discharge to home after PACU  PATIENT DISPOSITION:  PACU - hemodynamically stable.   Delay start of Pharmacological VTE agent (>24hrs) due to surgical blood loss or risk of bleeding: yes

## 2016-02-11 NOTE — Transfer of Care (Signed)
Immediate Anesthesia Transfer of Care Note  Patient: Kathy Pierce  Procedure(s) Performed: Procedure(s): VIDEO BRONCHOSCOPY WITH ENDOBRONCHIAL ULTRASOUND with biopsy of nodes #7 and #10 (N/A) VIDEO BRONCHOSCOPY WITH ENDOBRONCHIAL NAVIGATION (N/A) PLACEMENT OF FIDUCIAL Markers times three   Patient Location: PACU  Anesthesia Type:General  Level of Consciousness: awake, alert , oriented and patient cooperative  Airway & Oxygen Therapy: Patient Spontanous Breathing and Patient connected to nasal cannula oxygen  Post-op Assessment: Report given to RN and Post -op Vital signs reviewed and stable  Post vital signs: Reviewed and stable  Last Vitals:  Vitals:   02/11/16 1029  BP: (!) 150/66  Pulse: 68  Resp: 16  Temp: 36.5 C    Last Pain:  Vitals:   02/11/16 1029  TempSrc: Oral         Complications: No apparent anesthesia complications

## 2016-02-11 NOTE — Discharge Instructions (Signed)

## 2016-02-11 NOTE — Anesthesia Preprocedure Evaluation (Addendum)
Anesthesia Evaluation  Patient identified by MRN, date of birth, ID band Patient awake    Reviewed: Allergy & Precautions, H&P , NPO status , Patient's Chart, lab work & pertinent test results, reviewed documented beta blocker date and time   Airway Mallampati: II  TM Distance: >3 FB Neck ROM: Full    Dental no notable dental hx. (+) Upper Dentures, Lower Dentures, Dental Advisory Given   Pulmonary COPD,  COPD inhaler, former smoker,    Pulmonary exam normal breath sounds clear to auscultation       Cardiovascular hypertension, Pt. on medications and Pt. on home beta blockers  Rhythm:Regular Rate:Normal     Neuro/Psych Depression negative neurological ROS     GI/Hepatic negative GI ROS, Neg liver ROS,   Endo/Other  negative endocrine ROS  Renal/GU negative Renal ROS  negative genitourinary   Musculoskeletal   Abdominal   Peds  Hematology negative hematology ROS (+)   Anesthesia Other Findings   Reproductive/Obstetrics negative OB ROS                           Anesthesia Physical Anesthesia Plan  ASA: III  Anesthesia Plan: General   Post-op Pain Management:    Induction: Intravenous  Airway Management Planned: Oral ETT  Additional Equipment:   Intra-op Plan:   Post-operative Plan: Extubation in OR  Informed Consent: I have reviewed the patients History and Physical, chart, labs and discussed the procedure including the risks, benefits and alternatives for the proposed anesthesia with the patient or authorized representative who has indicated his/her understanding and acceptance.   Dental advisory given  Plan Discussed with: CRNA  Anesthesia Plan Comments:         Anesthesia Quick Evaluation

## 2016-02-12 ENCOUNTER — Encounter (HOSPITAL_COMMUNITY): Payer: Self-pay | Admitting: Cardiothoracic Surgery

## 2016-02-12 NOTE — Anesthesia Postprocedure Evaluation (Signed)
Anesthesia Post Note  Patient: Kathy Pierce  Procedure(s) Performed: Procedure(s) (LRB): VIDEO BRONCHOSCOPY WITH ENDOBRONCHIAL ULTRASOUND with biopsy of nodes #7 and #10 (N/A) VIDEO BRONCHOSCOPY WITH ENDOBRONCHIAL NAVIGATION (N/A) PLACEMENT OF FIDUCIAL Markers times three   Patient location during evaluation: PACU Anesthesia Type: General Level of consciousness: sedated Pain management: satisfactory to patient Vital Signs Assessment: post-procedure vital signs reviewed and stable Respiratory status: spontaneous breathing Cardiovascular status: stable Anesthetic complications: no       Last Vitals:  Vitals:   02/11/16 1733 02/11/16 1750  BP: (!) 116/97 116/60  Pulse: 67 64  Resp: (!) 21   Temp:      Last Pain:  Vitals:   02/11/16 1750  TempSrc:   PainSc: 0-No pain                 Anshi Jalloh EDWARD

## 2016-02-14 ENCOUNTER — Ambulatory Visit (HOSPITAL_BASED_OUTPATIENT_CLINIC_OR_DEPARTMENT_OTHER): Payer: Medicare Other | Admitting: Internal Medicine

## 2016-02-14 ENCOUNTER — Encounter: Payer: Self-pay | Admitting: Internal Medicine

## 2016-02-14 ENCOUNTER — Telehealth: Payer: Self-pay | Admitting: Internal Medicine

## 2016-02-14 ENCOUNTER — Telehealth: Payer: Self-pay | Admitting: *Deleted

## 2016-02-14 VITALS — BP 141/68 | HR 51 | Temp 97.8°F | Resp 18 | Ht 64.0 in | Wt 110.6 lb

## 2016-02-14 DIAGNOSIS — R911 Solitary pulmonary nodule: Secondary | ICD-10-CM | POA: Diagnosis not present

## 2016-02-14 MED ORDER — DOXYCYCLINE HYCLATE 100 MG PO TABS: 100.0000 mg | ORAL_TABLET | Freq: Two times a day (BID) | ORAL | 0 refills | Status: DC

## 2016-02-14 NOTE — Telephone Encounter (Signed)
Appointments scheduled per 1/18 LOS. Patient given AVS report and calendars with future scheduled appointments.

## 2016-02-14 NOTE — Telephone Encounter (Signed)
Oncology Nurse Navigator Documentation  Oncology Nurse Navigator Flowsheets 02/14/2016  Navigator Location [facility]  Navigator Encounter Type Telephone/I called Kathy Pierce today.  I asked if she was able to make appt today. She stated she will try to make it.  She will call if unable.   Telephone Outgoing Call  Treatment Phase Pre-Tx/Tx Discussion  Barriers/Navigation Needs Coordination of Care  Interventions Coordination of Care  Coordination of Care Appts  Acuity Level 1  Acuity Level 1 Minimal follow up required  Acuity Level 2 -  Time Spent with Patient 15

## 2016-02-14 NOTE — Op Note (Signed)
NAME:  Kathy Pierce, Kathy Pierce NO.:  [id number]  MEDICAL RECORD NO.:  [id number]  LOCATION:                                 FACILITY:  PHYSICIAN:  Lanelle Bal, MD         DATE OF BIRTH:  DATE OF PROCEDURE:  02/11/2016 DATE OF DISCHARGE:                              OPERATIVE REPORT   PREOPERATIVE DIAGNOSES:  Left lower lobe lung lesion, superior segment, suspicious of malignancy.  POSTOPERATIVE DIAGNOSES:  Left lower lobe lung lesion, superior segment, suspicious of malignancy.  SURGICAL PROCEDURE: 1. Video bronchoscopy with EBUS and biopsy of #10R and #7 nodes. 2. Electromagnetic navigation bronchoscopy with biopsy of left lower     lobe lung lesion and placement of fiducial markers x3.  BRIEF HISTORY:  The patient is an 83 year old female who had an abnormal chest x-ray prior to back surgery.  Ultimately, back surgery was canceled, but in followup she obtained a CT scan, which showed a 2 cm superior segment left lower lobe mass suggestive of malignancy.  A PET scan was performed, which showed some metabolic uptake in mediastinal nodes and right hilar nodes.  Of note, the patient was referred to thoracic surgery and presented at the Multidisciplinary Thoracic Oncology Conference.  It was recommended to the patient that we attempt to obtain a tissue diagnosis including the possible placement of fiducial markers.  This patient agreed and signed informed consent.  DESCRIPTION OF PROCEDURE:  The patient underwent general endotracheal anesthesia with a single-lumen endotracheal tube.  An appropriate time- out was performed and then we proceeded with a video bronchoscopy with a 2.8 mm video bronchoscope to the subsegmental level both in the right and left tracheobronchial tree without evidence of endobronchial lesions.  Scope was removed and we placed the EBUS scope.  Relatively small #7 lymph nodes were identified with EBUS scope and multiple passes of this area  were performed and smears were obtained for rapid interpretation and the remaining tissue placed in satellite.  Moving the scope into the right tracheobronchial tree, there were areas of 10R nodes that were suggested on PET scan and these were biopsied in the similar fashion.  The initial smears showed no evidence of malignancy. We then proceeded with previously planned navigation bronchoscopy and appropriate sensors had been placed on the patient.  The plan was appropriately registered with the LG in place, and then we proceeded to navigate to the superior segment, left lower lobe.  We were able to target the suspicious area appropriately and needle brushes, standard brush, and biopsies of that area were performed and submitted to Pathology.  With the suspiciousness of the lesion and the possibility of stereotactic radiotherapy to treat the patient, the ________ software fiducial marker protocol was used and 3 areas were mapped and 3 fiducial markers were placed for future use if needed.  Fluoroscopy showed no evidence of pneumothorax at the completion of the case.  The scopes were removed.  The patient was extubated in the operating room and transferred to the recovery room for postoperative care.  She tolerated the procedure without obvious complication.     Lanelle Bal, MD  EG/MEDQ  D:  02/13/2016  T:  02/14/2016  Job:  [id number]

## 2016-02-14 NOTE — Progress Notes (Signed)
[facility] [phone]   [phone]  OFFICE PROGRESS NOTE  [name], MD [address] [place] [address]  DIAGNOSIS: Questionable lung cancer presented with left lower lobe and questionable mediastinal and hilar lymph nodes.  PRIOR THERAPY: None  CURRENT THERAPY: None  INTERVAL HISTORY: Kathy Pierce 83 y.o. female returns to the clinic today for follow-up visit accompanied by her daughter. The patient is feeling fine today was no specific complaints except for persistent cough. She is using over-the-counter cough medication with mild improvement. She denied having any chest pain but has mild shortness of breath with no hemoptysis. She has no fever or chills. She denied having any significant weight loss or night sweats. She has no nausea, vomiting, diarrhea or constipation. The patient underwent navigational bronchoscopy with endobronchial ultrasound and biopsy of bilateral hilar and mediastinal lymph node as well as the left lower lobe lung nodule. The final pathology was negative for malignancy. She is here today for evaluation and discussion of her condition and treatment options.  MEDICAL HISTORY: Past Medical History:  Diagnosis Date  . Cancer (HCC)    Squamous cell carcinoma, skin, basal cell carcinoma  . Complication of anesthesia    wakes up slowly   . COPD (chronic obstructive pulmonary disease) ([place])   . Depression   . Essential thrombocythemia ([place])   . Hypertension   . Osteoporosis   . Sciatica of left side 06/16/2012  . Spinal stenosis of lumbar region 06/16/2012  . Vitamin B12 deficiency     ALLERGIES:  is allergic to penicillins; codeine; erythromycin; and sulfa drugs cross reactors.  MEDICATIONS:  Current Outpatient Prescriptions  Medication Sig Dispense Refill  . albuterol (PROVENTIL HFA;VENTOLIN HFA) 108 (90 Base) MCG/ACT inhaler Inhale 2 puffs into the lungs every 6 (six) hours as needed  for wheezing or shortness of breath.    Marland Kitchen aspirin 81 MG tablet Take 81 mg by mouth daily.    Marland Kitchen atenolol (TENORMIN) 50 MG tablet Take 50 mg by mouth daily.    . benzonatate (TESSALON) 100 MG capsule Take 100 mg by mouth 3 (three) times daily as needed for cough.    . calcium carbonate (OS-CAL - DOSED IN MG OF ELEMENTAL CALCIUM) 1250 MG tablet Take 1 tablet by mouth daily.    . cyanocobalamin (,VITAMIN B-12,) 1000 MCG/ML injection Inject 1,000 mcg into the muscle every 30 (thirty) days.    . fluticasone furoate-vilanterol (BREO ELLIPTA) 100-25 MCG/INH AEPB Inhale 1 puff into the lungs daily.    . hydrochlorothiazide (MICROZIDE) 12.5 MG capsule Take 12.5 mg by mouth daily.    . hydroxyurea (HYDREA) 500 MG capsule Take 500 mg by mouth every other day. May take with food to minimize GI side effects.    . Hypromellose (ARTIFICIAL TEARS OP) Place 1 drop into both eyes as needed (for dry eyes).    . meclizine (ANTIVERT) 25 MG tablet Take 25 mg by mouth 3 (three) times daily as needed for dizziness.    . sertraline (ZOLOFT) 50 MG tablet Take 50 mg by mouth daily.    . traMADol (ULTRAM) 50 MG tablet Take 50 mg by mouth every 8 (eight) hours as needed for moderate pain.   1   No current facility-administered medications for this visit.     SURGICAL HISTORY:  Past Surgical History:  Procedure Laterality Date  . APPENDECTOMY    . CATARACT EXTRACTION, BILATERAL Bilateral   . STAPEDECTOMY Right   .  TONSILLECTOMY    . VIDEO BRONCHOSCOPY WITH ENDOBRONCHIAL NAVIGATION N/A 02/11/2016   Procedure: VIDEO BRONCHOSCOPY WITH ENDOBRONCHIAL NAVIGATION;  Surgeon: Grace Isaac, MD;  Location: [facility];  Service: Thoracic;  Laterality: N/A;  . VIDEO BRONCHOSCOPY WITH ENDOBRONCHIAL ULTRASOUND N/A 02/11/2016   Procedure: VIDEO BRONCHOSCOPY WITH ENDOBRONCHIAL ULTRASOUND with biopsy of nodes #7 and #10;  Surgeon: Grace Isaac, MD;  Location: [facility];  Service: Thoracic;  Laterality: N/A;    REVIEW OF SYSTEMS:   Constitutional: positive for fatigue Eyes: negative Ears, nose, mouth, throat, and face: negative Respiratory: positive for cough and dyspnea on exertion Cardiovascular: negative Gastrointestinal: negative Genitourinary:negative Integument/breast: negative Hematologic/lymphatic: negative Musculoskeletal:negative Neurological: negative Behavioral/Psych: negative Endocrine: negative Allergic/Immunologic: negative   PHYSICAL EXAMINATION: General appearance: alert, cooperative, fatigued and no distress Head: Normocephalic, without obvious abnormality, atraumatic Neck: no adenopathy, no JVD, supple, symmetrical, trachea midline and thyroid not enlarged, symmetric, no tenderness/mass/nodules Lymph nodes: Cervical, supraclavicular, and axillary nodes normal. Resp: clear to auscultation bilaterally Back: symmetric, no curvature. ROM normal. No CVA tenderness. Cardio: regular rate and rhythm, S1, S2 normal, no murmur, click, rub or gallop GI: soft, non-tender; bowel sounds normal; no masses,  no organomegaly Extremities: extremities normal, atraumatic, no cyanosis or edema Neurologic: Alert and oriented X 3, normal strength and tone. Normal symmetric reflexes. Normal coordination and gait  ECOG PERFORMANCE STATUS: 1 - Symptomatic but completely ambulatory  There were no vitals taken for this visit.  LABORATORY DATA: Lab Results  Component Value Date   WBC 9.2 02/11/2016   HGB 13.2 02/11/2016   HCT 40.7 02/11/2016   MCV 110.6 (H) 02/11/2016   PLT 525 (H) 02/11/2016      Chemistry      Component Value Date/Time   NA 139 02/11/2016 1156   K 3.9 02/11/2016 1156   CL 102 02/11/2016 1156   CO2 26 02/11/2016 1156   BUN 26 (H) 02/11/2016 1156   CREATININE 0.65 02/11/2016 1156      Component Value Date/Time   CALCIUM 9.4 02/11/2016 1156   ALKPHOS 58 02/11/2016 1156   AST 25 02/11/2016 1156   ALT 13 (L) 02/11/2016 1156   BILITOT 0.7 02/11/2016 1156       RADIOGRAPHIC  STUDIES: Dg Chest 2 View  Result Date: 02/11/2016 CLINICAL DATA:  Preop for surgery this afternoon concerning left lung nodule. EXAM: CHEST  2 VIEW COMPARISON:  08/07/2015 and PET-CT 01/18/2016 FINDINGS: Lungs are hyperexpanded with persistent indistinct nodular opacification over the left midlung unchanged. Mild flattening of the hemidiaphragms on the lateral film. No evidence of effusion. Cardiomediastinal silhouette is within normal. Minimal calcified plaque over the aortic arch. Mild degenerate change of the spine. Diffuse osteopenia. Displaced lateral right second rib fracture not well visualized previously. IMPRESSION: No acute cardiopulmonary disease. Stable indistinct nodular opacification over the left midlung which is the apparent target for patient's surgery today. Displaced right lateral second rib fracture not well visualized previously. Electronically Signed   By: Marin Olp M.D.   On: 02/11/2016 12:02   Nm Pet Image Initial (pi) Skull Base To Thigh  Result Date: 01/18/2016 CLINICAL DATA:  Initial treatment strategy for lung mass. EXAM: NUCLEAR MEDICINE PET SKULL BASE TO THIGH TECHNIQUE: 6.0 mCi F-18 FDG was injected intravenously. Full-ring PET imaging was performed from the skull base to thigh after the radiotracer. CT data was obtained and used for attenuation correction and anatomic localization. FASTING BLOOD GLUCOSE:  Value: 115 mg/dl COMPARISON:  CT chest 01/03/2016 FINDINGS: NECK No hypermetabolic lymph  nodes in the neck. CHEST Spiculated left lower lobe pulmonary lesion is hypermetabolic with SUV max = 5.1. Previously characterized AP window lymph node is hypermetabolic with SUV max = 4.4. Central hypermetabolic right axillary lymph node is hypermetabolic with SUV max = 6.2. Other hypermetabolic lymph nodes are identified in the hilar regions bilaterally. The heart is enlarged. Coronary artery and thoracic aortic atherosclerosis are evident. Diffuse emphysematous change noted in the  lungs. Right apical scarring again noted. ABDOMEN/PELVIS Layering tiny calcified stones are seen in the lumen of the gallbladder. There is a large right upper quadrant diverticulum, presumably duodenal in origin. Atherosclerotic calcification noted in the wall of the abdominal aorta without aneurysm. Left colonic diverticulosis without diverticulitis. SKELETON No focal hypermetabolic activity to suggest skeletal metastasis. IMPRESSION: 1. Left lower lobe pulmonary nodule is hypermetabolic and is associated with hypermetabolic lymph nodes in the AP window, precarinal station, and both hilar regions. 2. Emphysema. 3. Coronary artery and thoracoabdominal aortic atherosclerosis. 4. Cholelithiasis. Electronically Signed   By: Misty Stanley M.D.   On: 01/18/2016 15:51   Dg Chest Port 1 View  Result Date: 02/11/2016 CLINICAL DATA:  Status post bronchoscopy EXAM: PORTABLE CHEST 1 VIEW COMPARISON:  1140 hours FINDINGS: The heart remains enlarged. Lungs remain markedly hyperaerated. Left perihilar opacities are stable. There is a less than 5% left pneumothorax. IMPRESSION: Less than 5% left apical pneumothorax. Critical Value/emergent results were called by telephone at the time of interpretation on 02/11/2016 at 5:09 pm to Dr. Gillermina Phy, who verbally acknowledged these results. Electronically Signed   By: Marybelle Killings M.D.   On: 02/11/2016 17:10    ASSESSMENT AND PLAN: This is a very pleasant 83 years old white female with left lower lobe pulmonary nodule as well as bilateral hilar and mediastinal lymphadenopathy. This was highly suspicious for lung cancer but the patient recently underwent navigational bronchoscopy with endobronchial ultrasound and biopsies of the left lower lobe pulmonary nodule and lymph node that was negative for malignancy. I had a lengthy discussion with the patient today about her condition. I recommended for the patient to consider CT-guided core biopsy of the left lower lobe lung nodule  to completely rule out the presence of malignancy. If the biopsy is negative, we will monitor her by observation and repeat imaging studies in a few months. If the left lower lobe nodule biopsy is positive for malignancy, the patient may benefit from stereotactic radiotherapy to this lesion. In the meantime I started the patient on doxycycline 100 mg by mouth twice a day for 7 days for the persistent cough and questionable inflammation in the lung. The patient and her daughter agreed to the current plan. She will come back for follow-up visit in 2 weeks for reevaluation and more detailed discussion of her condition after the biopsy results. She was advised to call immediately if she has any concerning symptoms in the interval. The patient voices understanding of current disease status and treatment options and is in agreement with the current care plan.  All questions were answered. The patient knows to call the clinic with any problems, questions or concerns. We can certainly see the patient much sooner if necessary.  I spent 15 minutes counseling the patient face to face. The total time spent in the appointment was 25 minutes.  Disclaimer: This note was dictated with voice recognition software. Similar sounding words can inadvertently be transcribed and may not be corrected upon review.

## 2016-02-21 ENCOUNTER — Other Ambulatory Visit: Payer: Self-pay

## 2016-02-21 ENCOUNTER — Ambulatory Visit: Payer: Self-pay | Admitting: Internal Medicine

## 2016-02-21 ENCOUNTER — Other Ambulatory Visit: Payer: Self-pay | Admitting: Radiology

## 2016-02-22 ENCOUNTER — Telehealth: Payer: Self-pay | Admitting: *Deleted

## 2016-02-22 ENCOUNTER — Encounter (HOSPITAL_COMMUNITY): Payer: Self-pay

## 2016-02-22 ENCOUNTER — Ambulatory Visit (HOSPITAL_COMMUNITY)
Admission: RE | Admit: 2016-02-22 | Discharge: 2016-02-22 | Disposition: A | Discharge status: Home / Self Care | Payer: Medicare Other | Source: Ambulatory Visit | Attending: Internal Medicine | Admitting: Internal Medicine

## 2016-02-22 DIAGNOSIS — R911 Solitary pulmonary nodule: Secondary | ICD-10-CM

## 2016-02-22 NOTE — Progress Notes (Signed)
Patient presented today for possible lung biopsy.  After discussion with the patient and Dr. Annamaria Boots regarding the procedure, the patient decided that she did not want to go through the procedure secondary to her risk for complications.  The procedure was cancelled for now.  Dr. Annamaria Boots will contact Dr. Julien Nordmann and the patient's daughter [name] will call Dr. Annamaria Boots as she was not able to make it today.  Windle Huebert E 10:58 AM 02/22/2016

## 2016-02-22 NOTE — Telephone Encounter (Signed)
Yolande Jolly (daughter) called LMOVM states " my mother went to see the Dr for the biopsy and she did not get the biopsy because the information she received for the physician was not the same as what they were told." Joy advised she was not able to go to the pt's appt as she lives out of town and has shingles. Returned call to Joy LMOVM to advise MD out of office. Message will be routed to MD for review. Joy request call back from MD at [phone].

## 2016-02-24 NOTE — Telephone Encounter (Signed)
I did speak to the daughter. We will discuss her case in our weekly conference and come up with treatment planning. We will call the patient and her daughter once we reach a treatment option for her.

## 2016-02-29 ENCOUNTER — Telehealth: Payer: Self-pay | Admitting: *Deleted

## 2016-02-29 NOTE — Telephone Encounter (Signed)
Oncology Nurse Navigator Documentation  Oncology Nurse Navigator Flowsheets 02/29/2016  Navigator Location [facility]  Navigator Encounter Type Telephone/Joy called and wanted a follow up regarding cancer conference discussion.  I called and updated.  I stated Dr. Julien Nordmann will go over further at next visit.  She was thankful for the call.   Telephone Outgoing Call  Treatment Phase Pre-Tx/Tx Discussion  Barriers/Navigation Needs Education  Education Other  Interventions Education  Education Method Verbal  Acuity Level 1  Acuity Level 1 Minimal follow up required  Time Spent with Patient 15

## 2016-03-01 ENCOUNTER — Telehealth: Payer: Self-pay

## 2016-03-01 NOTE — Telephone Encounter (Signed)
Called and left a message with a new appt due to pal day   Kathy Pierce

## 2016-03-04 ENCOUNTER — Encounter: Payer: Self-pay | Admitting: Radiation Oncology

## 2016-03-10 NOTE — Progress Notes (Addendum)
Thoracic Location of Tumor / Histology: left lower lobe pulmonary nodule as well as bilateral hilar and mediastinal lympadenopathy  Patient's lung lesion was found by a chest xray obtained prior to hip surgery.  Biopsies (if applicable) revealed:    Tobacco/Marijuana/Snuff/ETOH use: She has a history of smoking less than one pack per day for around 20 years and quit in 1979.  Past/Anticipated interventions by cardiothoracic surgery, if any: none  Past/Anticipated interventions by medical oncology, if any: no  Signs/Symptoms  Weight changes, if any: reports she lost down to 107lb but began supplementing with Ensure and her weight increased to 112 lb.  Respiratory complaints, if any: Productive cough with thick white sputum. SOB only with "alot" of exertion.  Hemoptysis, if any: no  Pain issues, if any: left sided pain around breast area.  SAFETY ISSUES:  Prior radiation? no  Pacemaker/ICD? no   Possible current pregnancy?no  Is the patient on methotrexate? no  Current Complaints / other details: 83 year old female. Single. Lives independently.Refused repeat biopsy

## 2016-03-11 ENCOUNTER — Ambulatory Visit: Payer: Self-pay | Admitting: Internal Medicine

## 2016-03-11 ENCOUNTER — Other Ambulatory Visit: Payer: Self-pay

## 2016-03-12 ENCOUNTER — Encounter: Payer: Self-pay | Admitting: Radiation Oncology

## 2016-03-12 ENCOUNTER — Ambulatory Visit
Admission: RE | Admit: 2016-03-12 | Discharge: 2016-03-12 | Disposition: A | Discharge status: Home / Self Care | Payer: Medicare Other | Source: Ambulatory Visit | Attending: Radiation Oncology | Admitting: Radiation Oncology

## 2016-03-12 VITALS — BP 157/90 | HR 64 | Temp 97.9°F | Resp 18 | Wt 112.4 lb

## 2016-03-12 DIAGNOSIS — D473 Essential (hemorrhagic) thrombocythemia: Secondary | ICD-10-CM | POA: Insufficient documentation

## 2016-03-12 DIAGNOSIS — Z885 Allergy status to narcotic agent status: Secondary | ICD-10-CM | POA: Diagnosis not present

## 2016-03-12 DIAGNOSIS — Z7982 Long term (current) use of aspirin: Secondary | ICD-10-CM | POA: Diagnosis not present

## 2016-03-12 DIAGNOSIS — Z8249 Family history of ischemic heart disease and other diseases of the circulatory system: Secondary | ICD-10-CM | POA: Diagnosis not present

## 2016-03-12 DIAGNOSIS — Z87891 Personal history of nicotine dependence: Secondary | ICD-10-CM | POA: Diagnosis not present

## 2016-03-12 DIAGNOSIS — F329 Major depressive disorder, single episode, unspecified: Secondary | ICD-10-CM | POA: Diagnosis not present

## 2016-03-12 DIAGNOSIS — D381 Neoplasm of uncertain behavior of trachea, bronchus and lung: Secondary | ICD-10-CM

## 2016-03-12 DIAGNOSIS — J449 Chronic obstructive pulmonary disease, unspecified: Secondary | ICD-10-CM | POA: Insufficient documentation

## 2016-03-12 DIAGNOSIS — E538 Deficiency of other specified B group vitamins: Secondary | ICD-10-CM | POA: Diagnosis not present

## 2016-03-12 DIAGNOSIS — M810 Age-related osteoporosis without current pathological fracture: Secondary | ICD-10-CM | POA: Insufficient documentation

## 2016-03-12 DIAGNOSIS — Z881 Allergy status to other antibiotic agents status: Secondary | ICD-10-CM | POA: Diagnosis not present

## 2016-03-12 DIAGNOSIS — Z79899 Other long term (current) drug therapy: Secondary | ICD-10-CM | POA: Diagnosis not present

## 2016-03-12 DIAGNOSIS — Z880 Allergy status to penicillin: Secondary | ICD-10-CM | POA: Diagnosis not present

## 2016-03-12 DIAGNOSIS — Z882 Allergy status to sulfonamides status: Secondary | ICD-10-CM | POA: Diagnosis not present

## 2016-03-12 DIAGNOSIS — Z85828 Personal history of other malignant neoplasm of skin: Secondary | ICD-10-CM | POA: Diagnosis not present

## 2016-03-12 DIAGNOSIS — M48061 Spinal stenosis, lumbar region without neurogenic claudication: Secondary | ICD-10-CM | POA: Diagnosis not present

## 2016-03-12 DIAGNOSIS — Z510 Encounter for antineoplastic radiation therapy: Secondary | ICD-10-CM | POA: Insufficient documentation

## 2016-03-12 DIAGNOSIS — C3432 Malignant neoplasm of lower lobe, left bronchus or lung: Secondary | ICD-10-CM

## 2016-03-12 DIAGNOSIS — I10 Essential (primary) hypertension: Secondary | ICD-10-CM | POA: Insufficient documentation

## 2016-03-12 NOTE — Progress Notes (Signed)
See progress note under physician encounter. 

## 2016-03-12 NOTE — Progress Notes (Signed)
Radiation Oncology         [phone] ________________________________  Initial outpatient Consultation  Name: Kathy Pierce MRN: [id number]  Date: 03/12/2016  DOB: [birth year]  UY:[id number] RUTH, MD  Curt Bears, MD   REFERRING PHYSICIAN: Curt Bears, MD  DIAGNOSIS: The encounter diagnosis was Primary cancer of left lower lobe of lung ([place]).    ICD-9-CM ICD-10-CM   1. Primary cancer of left lower lobe of lung (HCC) 162.5 C34.32     HISTORY OF PRESENT ILLNESS: Kathy Pierce is a 83 y.o. female seen at the request of Dr. Julien Nordmann.  Kathy Pierce is an 83 year-old woman who was found to have nodular appearing density in the left mid lung on the frontal film of 08/07/2015 chest X-ray performed for pre-op evaluation for upcoming lumbar surgery. She underwent CT chest without contrast on 01/03/2016 for further evaluation which showed a spiculated superior segment left lower lobe mass measuring 16 x 14 x 17 mm with precarinal and subcarinal lymphadenopathy up to 11 mm short axis. Nodularity of the left adrenal gland was also noted but demonstrated Hounsfield units more in keeping with a benign adenoma. PET scan on 01/18/2016 revealed a hypermetabolic left lower lower lobe pulmonary nodule with associated hypermetabolic lymph nodes in the AP window, precarinal station, and both hilar regions.   Navigation bronchoscopy of the left lower lung lesion and video bronchoscopy with EBUS for biopsy of the bilateral hilar and mediastinal nodes was performed on 02/11/2016. Fiducial markers were placed at the time of procedure as well.  Pathology showed benign lung parenchyma with mild chronic inflammation and anthracosis with no evidence of malignancy.  She was recommended to have further CT guided biopsy but, she declined.   She is here for discussion and consideration of radiation therapy in the management of her disease.   PREVIOUS RADIATION THERAPY: No  PAST MEDICAL HISTORY:  Past Medical  History:  Diagnosis Date  . Cancer (HCC)    Squamous cell carcinoma, skin, basal cell carcinoma  . Complication of anesthesia    wakes up slowly   . COPD (chronic obstructive pulmonary disease) ([place])   . Depression   . Essential thrombocythemia ([place])   . Hypertension   . Osteoporosis   . Sciatica of left side 06/16/2012  . Spinal stenosis of lumbar region 06/16/2012  . Vitamin B12 deficiency       PAST SURGICAL HISTORY: Past Surgical History:  Procedure Laterality Date  . APPENDECTOMY    . CATARACT EXTRACTION, BILATERAL Bilateral   . STAPEDECTOMY Right   . TONSILLECTOMY    . VIDEO BRONCHOSCOPY WITH ENDOBRONCHIAL NAVIGATION N/A 02/11/2016   Procedure: VIDEO BRONCHOSCOPY WITH ENDOBRONCHIAL NAVIGATION;  Surgeon: Grace Isaac, MD;  Location: [facility];  Service: Thoracic;  Laterality: N/A;  . VIDEO BRONCHOSCOPY WITH ENDOBRONCHIAL ULTRASOUND N/A 02/11/2016   Procedure: VIDEO BRONCHOSCOPY WITH ENDOBRONCHIAL ULTRASOUND with biopsy of nodes #7 and #10;  Surgeon: Grace Isaac, MD;  Location[facility];  Service: Thoracic;  Laterality: N/A;    FAMILY HISTORY:  Family History  Problem Relation Age of Onset  . Heart attack Father   . Cancer Mother   . Cancer Sister   . Cancer Brother   . Cancer Brother   . Cancer Daughter     SOCIAL HISTORY:  Social History   Social History  . Marital status: Widowed    Spouse name: N/A  . Number of children: N/A  . Years of education: N/A   Occupational History  .  Not on file.   Social History Main Topics  . Smoking status: Former Smoker    Packs/day: 1.00    Years: 21.00    Types: Cigarettes    Quit date: 07/10/1976  . Smokeless tobacco: Never Used  . Alcohol use No  . Drug use: No  . Sexual activity: No   Other Topics Concern  . Not on file   Social History Narrative  . No narrative on file    ALLERGIES: Penicillins; Codeine; Erythromycin; and Sulfa drugs cross reactors  MEDICATIONS:  Current Outpatient Prescriptions    Medication Sig Dispense Refill  . albuterol (PROVENTIL HFA;VENTOLIN HFA) 108 (90 Base) MCG/ACT inhaler Inhale 2 puffs into the lungs every 6 (six) hours as needed for wheezing or shortness of breath.    Marland Kitchen aspirin 81 MG tablet Take 81 mg by mouth daily.    Marland Kitchen atenolol (TENORMIN) 50 MG tablet Take 50 mg by mouth 2 (two) times daily.     . benzonatate (TESSALON) 100 MG capsule Take 100 mg by mouth 3 (three) times daily as needed for cough.    . calcium carbonate (OS-CAL - DOSED IN MG OF ELEMENTAL CALCIUM) 1250 MG tablet Take 1 tablet by mouth 2 (two) times daily with a meal.     . cyanocobalamin (,VITAMIN B-12,) 1000 MCG/ML injection Inject 1,000 mcg into the muscle every 30 (thirty) days.    . fluticasone furoate-vilanterol (BREO ELLIPTA) 100-25 MCG/INH AEPB Inhale 1 puff into the lungs daily.    . hydrochlorothiazide (MICROZIDE) 12.5 MG capsule Take 12.5 mg by mouth daily.    . hydroxyurea (HYDREA) 500 MG capsule Take 500 mg by mouth every other day. May take with food to minimize GI side effects. Reports alternating one day one day then two tablets the next.    . Hypromellose (ARTIFICIAL TEARS OP) Place 1 drop into both eyes as needed (for dry eyes).    . sertraline (ZOLOFT) 50 MG tablet Take 50 mg by mouth daily.    . meclizine (ANTIVERT) 25 MG tablet Take 25 mg by mouth 3 (three) times daily as needed for dizziness.    . traMADol (ULTRAM) 50 MG tablet Take 50 mg by mouth every 8 (eight) hours as needed for moderate pain.   1   No current facility-administered medications for this encounter.     REVIEW OF SYSTEMS:  On review of systems, the patient reports that she is doing well overall. She denies any fevers, chills, night sweats or unintentional weight changes. She reports she is maintaining her weight. She had lost down to 107 lbs but began supplementing with Ensure and her weight increased to 112 lbs. She reports chronic shortness of breath on exertion, related to COPD with no recent changes.  She reports cough with thick white sputum. She denies hemoptysis. She denies any bowel or bladder disturbances, and denies abdominal pain, nausea or vomiting. She reports intermittent deep left sided pain around breast area, most noticeable at night when she is lying on her left side. This developed after her recent procedure and is gradually improving. A complete review of systems is obtained and is otherwise negative.  She has a history of smoking less than one pack per day for around 20 years and quit in 1979.    PHYSICAL EXAM:  Wt Readings from Last 3 Encounters:  03/12/16 112 lb 6.4 oz (51 kg)  02/22/16 109 lb (49.4 kg)  02/14/16 110 lb 9.6 oz (50.2 kg)   Temp Readings from Last  3 Encounters:  03/12/16 97.9 F (36.6 C) (Oral)  02/22/16 97.9 F (36.6 C) (Oral)  02/14/16 97.8 F (36.6 C) (Oral)   BP Readings from Last 3 Encounters:  03/12/16 (!) 157/90  02/22/16 118/63  02/14/16 (!) 141/68   Pulse Readings from Last 3 Encounters:  03/12/16 64  02/22/16 66  02/14/16 (!) 51   Pain Assessment Pain Score: 0-No pain/10  In general this is a well appearing caucasian female in no acute distress. She is alert and oriented x4 and appropriate throughout the examination. HEENT reveals that the patient is normocephalic, atraumatic. EOMs are intact. PERRLA. Skin is intact without any evidence of gross lesions. Cardiovascular exam reveals a regular rate and rhythm, no clicks rubs or murmurs are auscultated. Chest is clear to auscultation bilaterally. Lymphatic assessment is performed and does not reveal any adenopathy in the cervical, supraclavicular, axillary, or inguinal chains. Abdomen has active bowel sounds in all quadrants and is intact. The abdomen is soft, non tender, non distended. Lower extremities are negative for pretibial pitting edema, deep calf tenderness, cyanosis or clubbing.   KPS = 90  100 - Normal; no complaints; no evidence of disease. 90   - Able to carry on normal  activity; minor signs or symptoms of disease. 80   - Normal activity with effort; some signs or symptoms of disease. 52   - Cares for self; unable to carry on normal activity or to do active work. 60   - Requires occasional assistance, but is able to care for most of his personal needs. 50   - Requires considerable assistance and frequent medical care. 65   - Disabled; requires special care and assistance. 29   - Severely disabled; hospital admission is indicated although death not imminent. 71   - Very sick; hospital admission necessary; active supportive treatment necessary. 10   - Moribund; fatal processes progressing rapidly. 0     - Dead  Karnofsky DA, Abelmann Antoine, Craver LS and Burchenal [facility] [phone]) The use of the nitrogen mustards in the palliative treatment of carcinoma: with particular reference to bronchogenic carcinoma Cancer 1 634-56  LABORATORY DATA:  Lab Results  Component Value Date   WBC 9.2 02/11/2016   HGB 13.2 02/11/2016   HCT 40.7 02/11/2016   MCV 110.6 (H) 02/11/2016   PLT 525 (H) 02/11/2016   Lab Results  Component Value Date   NA 139 02/11/2016   K 3.9 02/11/2016   CL 102 02/11/2016   CO2 26 02/11/2016   Lab Results  Component Value Date   ALT 13 (L) 02/11/2016   AST 25 02/11/2016   ALKPHOS 58 02/11/2016   BILITOT 0.7 02/11/2016     RADIOGRAPHY: Dg Chest Port 1 View  Result Date: 02/11/2016 CLINICAL DATA:  Status post bronchoscopy EXAM: PORTABLE CHEST 1 VIEW COMPARISON:  1140 hours FINDINGS: The heart remains enlarged. Lungs remain markedly hyperaerated. Left perihilar opacities are stable. There is a less than 5% left pneumothorax. IMPRESSION: Less than 5% left apical pneumothorax. Critical Value/emergent results were called by telephone at the time of interpretation on 02/11/2016 at 5:09 pm to Dr. Gillermina Phy, who verbally acknowledged these results. Electronically Signed   By: Marybelle Killings M.D.   On: 02/11/2016 17:10   Dg C-arm Bronchoscopy  Result  Date: 02/19/2016 C-ARM BRONCHOSCOPY: Fluoroscopy was utilized by the requesting physician.  No radiographic interpretation.      IMPRESSION/PLAN: 1. 83 y.o. woman with putative Stage IIIa cT1a N3 M0 primary bronchogenic carcinoma. Today,  we talked to the patient and her granddaughter, Donia Guiles, about the findings and work-up thus far.  We discussed the natural history of lung cancer and general treatment, highlighting the role of stereotactic body radiotherapy in the management.  We discussed the available radiation techniques, and focused on the details of logistics and delivery.  We reviewed the anticipated acute and late sequelae associated with SBRT in this setting.  The patient was encouraged to ask questions that we answered to the best of our ability. Dr. Tammi Klippel would recommend total dose of 54 Gy over 3 fractions. The patient would like to proceed with SBRT and has been scheduled for CT simulation next Thursday, 03/20/16 at 10:30 am.     Nicholos Johns, PA-C    Tyler Pita, MD  [place]: [phone]  Fax: [phone] [email or web address]  Skype  [email or web address]   This document serves as a record of services personally performed by Tyler Pita, MD and Freeman Caldron, PA-C. It was created on their behalf by Arlyce Harman, a trained medical scribe. The creation of this record is based on the scribe's personal observations and the provider's statements to them. This document has been checked and approved by the attending provider.

## 2016-03-13 ENCOUNTER — Telehealth: Payer: Self-pay | Admitting: *Deleted

## 2016-03-13 NOTE — Telephone Encounter (Signed)
Oncology Nurse Navigator Documentation  Oncology Nurse Navigator Flowsheets 03/13/2016  Navigator Location [facility]  Navigator Encounter Type Telephone/I called to follow up with Ms. Burgert. She was Rad Onc yesterday and I wanted to make sure she did not have any questions.  I was unable to reach but left vm message to call if needed with my name and phone number.   Telephone Outgoing Call  Treatment Phase Pre-Tx/Tx Discussion  Barriers/Navigation Needs Education  Interventions Education  Acuity Level 1  Time Spent with Patient 15

## 2016-03-19 DIAGNOSIS — N3090 Cystitis, unspecified without hematuria: Secondary | ICD-10-CM | POA: Diagnosis not present

## 2016-03-20 ENCOUNTER — Ambulatory Visit
Admission: RE | Admit: 2016-03-20 | Discharge: 2016-03-20 | Disposition: A | Discharge status: Home / Self Care | Payer: Medicare Other | Source: Ambulatory Visit | Attending: Radiation Oncology | Admitting: Radiation Oncology

## 2016-03-20 DIAGNOSIS — Z510 Encounter for antineoplastic radiation therapy: Secondary | ICD-10-CM | POA: Diagnosis not present

## 2016-03-20 DIAGNOSIS — J449 Chronic obstructive pulmonary disease, unspecified: Secondary | ICD-10-CM | POA: Diagnosis not present

## 2016-03-20 DIAGNOSIS — E538 Deficiency of other specified B group vitamins: Secondary | ICD-10-CM | POA: Diagnosis not present

## 2016-03-20 DIAGNOSIS — F329 Major depressive disorder, single episode, unspecified: Secondary | ICD-10-CM | POA: Diagnosis not present

## 2016-03-20 DIAGNOSIS — I10 Essential (primary) hypertension: Secondary | ICD-10-CM | POA: Diagnosis not present

## 2016-03-20 DIAGNOSIS — M810 Age-related osteoporosis without current pathological fracture: Secondary | ICD-10-CM | POA: Diagnosis not present

## 2016-03-20 DIAGNOSIS — D473 Essential (hemorrhagic) thrombocythemia: Secondary | ICD-10-CM | POA: Diagnosis not present

## 2016-03-20 DIAGNOSIS — C3432 Malignant neoplasm of lower lobe, left bronchus or lung: Secondary | ICD-10-CM | POA: Diagnosis not present

## 2016-03-20 DIAGNOSIS — Z85828 Personal history of other malignant neoplasm of skin: Secondary | ICD-10-CM | POA: Diagnosis not present

## 2016-03-20 DIAGNOSIS — M48061 Spinal stenosis, lumbar region without neurogenic claudication: Secondary | ICD-10-CM | POA: Diagnosis not present

## 2016-03-20 NOTE — Progress Notes (Signed)
  Radiation Oncology         [phone] ________________________________  Name: Kathy Pierce MRN: [id number]  Date: 03/20/2016  DOB: Nov 05, 1925  STEREOTACTIC BODY RADIOTHERAPY SIMULATION AND TREATMENT PLANNING NOTE    ICD-9-CM ICD-10-CM   1. Putative cancer of left lower lobe of lung (HCC) 162.5 C34.32     DIAGNOSIS:  83 y.o. woman with putative clinical T1a NX M0 primary bronchogenic carcinoma of the left lower lobe of the lung    NARRATIVE:  The patient was brought to the [facility].  Identity was confirmed.  All relevant records and images related to the planned course of therapy were reviewed.  The patient freely provided informed written consent to proceed with treatment after reviewing the details related to the planned course of therapy. The consent form was witnessed and verified by the simulation staff.  Then, the patient was set-up in a stable reproducible  supine position for radiation therapy.  A BodyFix immobilization pillow was fabricated for reproducible positioning.  Then I personally applied the abdominal compression paddle to limit respiratory excursion.  4D respiratoy motion management CT images were obtained.  Surface markings were placed.  The CT images were loaded into the planning software.  Then, using Cine, MIP, and standard views, the internal target volume (ITV) and planning target volumes (PTV) were delinieated, and avoidance structures were contoured.  Treatment planning then occurred.  The radiation prescription was entered and confirmed.  A total of two complex treatment devices were fabricated in the form of the BodyFix immobilization pillow and a neck accuform cushion.  I have requested : 3D Simulation  I have requested a DVH of the following structures: Heart, Lungs, Esophagus, Chest Wall, Brachial Plexus, Major Blood Vessels, and targets.  SPECIAL TREATMENT PROCEDURE:  The planned course of therapy using radiation constitutes a special  treatment procedure. Special care is required in the management of this patient for the following reasons. This treatment constitutes a Special Treatment Procedure for the following reason: [ High dose per fraction requiring special monitoring for increased toxicities of treatment including daily imaging..  The special nature of the planned course of radiotherapy will require increased physician supervision and oversight to ensure patient's safety with optimal treatment outcomes.  RESPIRATORY MOTION MANAGEMENT SIMULATION:  In order to account for effect of respiratory motion on target structures and other organs in the planning and delivery of radiotherapy, this patient underwent respiratory motion management simulation.  To accomplish this, when the patient was brought to the CT simulation planning suite, 4D respiratoy motion management CT images were obtained.  The CT images were loaded into the planning software.  Then, using a variety of tools including Cine, MIP, and standard views, the target volume and planning target volumes (PTV) were delineated.  Avoidance structures were contoured.  Treatment planning then occurred.  Dose volume histograms were generated and reviewed for each of the requested structure.  The resulting plan was carefully reviewed and approved today.  PLAN:  The patient will receive 54 Gy in 3 fractions.  ________________________________  Sheral Apley Tammi Klippel, M.D.

## 2016-03-21 ENCOUNTER — Telehealth: Payer: Self-pay | Admitting: *Deleted

## 2016-03-21 NOTE — Telephone Encounter (Signed)
Oncology Nurse Navigator Documentation  Oncology Nurse Navigator Flowsheets 03/13/2016  Navigator Location [facility]  Navigator Encounter Type Telephone/I called Kathy Pierce today to check on how she was doing.  She had SIM yesterday.  I was unable to reach her but left vm message with my name and phone number to call.   Telephone Outgoing Call  Treatment Phase Pre-Tx/Tx Discussion  Barriers/Navigation Needs Education  Interventions Education  Acuity Level 1  Time Spent with Patient 15

## 2016-03-27 DIAGNOSIS — C3432 Malignant neoplasm of lower lobe, left bronchus or lung: Secondary | ICD-10-CM | POA: Diagnosis not present

## 2016-03-27 DIAGNOSIS — Z510 Encounter for antineoplastic radiation therapy: Secondary | ICD-10-CM | POA: Diagnosis not present

## 2016-04-01 ENCOUNTER — Other Ambulatory Visit: Payer: Self-pay | Admitting: *Deleted

## 2016-04-01 DIAGNOSIS — C3432 Malignant neoplasm of lower lobe, left bronchus or lung: Secondary | ICD-10-CM

## 2016-04-02 ENCOUNTER — Encounter: Payer: Self-pay | Admitting: Internal Medicine

## 2016-04-02 ENCOUNTER — Telehealth: Payer: Self-pay | Admitting: Internal Medicine

## 2016-04-02 ENCOUNTER — Other Ambulatory Visit (HOSPITAL_BASED_OUTPATIENT_CLINIC_OR_DEPARTMENT_OTHER): Payer: Medicare Other

## 2016-04-02 ENCOUNTER — Ambulatory Visit (HOSPITAL_BASED_OUTPATIENT_CLINIC_OR_DEPARTMENT_OTHER): Payer: Medicare Other | Admitting: Internal Medicine

## 2016-04-02 VITALS — BP 134/81 | HR 69 | Temp 97.5°F | Resp 18 | Wt 112.2 lb

## 2016-04-02 DIAGNOSIS — C3432 Malignant neoplasm of lower lobe, left bronchus or lung: Secondary | ICD-10-CM | POA: Diagnosis not present

## 2016-04-02 LAB — CBC WITH DIFFERENTIAL/PLATELET
BASO%: 0.3 % (ref 0.0–2.0)
BASOS ABS: 0 10*3/uL (ref 0.0–0.1)
EOS%: 1.5 % (ref 0.0–7.0)
Eosinophils Absolute: 0.1 10*3/uL (ref 0.0–0.5)
HEMATOCRIT: 40.1 % (ref 34.8–46.6)
HGB: 13.2 g/dL (ref 11.6–15.9)
LYMPH#: 0.9 10*3/uL (ref 0.9–3.3)
LYMPH%: 12.7 % — ABNORMAL LOW (ref 14.0–49.7)
MCH: 35.7 pg — AB (ref 25.1–34.0)
MCHC: 32.9 g/dL (ref 31.5–36.0)
MCV: 108.8 fL — ABNORMAL HIGH (ref 79.5–101.0)
MONO#: 1.1 10*3/uL — AB (ref 0.1–0.9)
MONO%: 15.2 % — ABNORMAL HIGH (ref 0.0–14.0)
NEUT#: 4.9 10*3/uL (ref 1.5–6.5)
NEUT%: 70.3 % (ref 38.4–76.8)
PLATELETS: 465 10*3/uL — AB (ref 145–400)
RBC: 3.69 10*6/uL — ABNORMAL LOW (ref 3.70–5.45)
RDW: 15.7 % — ABNORMAL HIGH (ref 11.2–14.5)
WBC: 6.9 10*3/uL (ref 3.9–10.3)

## 2016-04-02 LAB — COMPREHENSIVE METABOLIC PANEL
ALT: 12 U/L (ref 0–55)
AST: 22 U/L (ref 5–34)
Albumin: 3.6 g/dL (ref 3.5–5.0)
Alkaline Phosphatase: 58 U/L (ref 40–150)
Anion Gap: 7 mEq/L (ref 3–11)
BUN: 25.5 mg/dL (ref 7.0–26.0)
CO2: 28 meq/L (ref 22–29)
Calcium: 9.5 mg/dL (ref 8.4–10.4)
Chloride: 102 mEq/L (ref 98–109)
Creatinine: 0.7 mg/dL (ref 0.6–1.1)
EGFR: 72 mL/min/{1.73_m2} — AB (ref >90)
GLUCOSE: 105 mg/dL (ref 70–140)
POTASSIUM: 4.3 meq/L (ref 3.5–5.1)
SODIUM: 137 meq/L (ref 136–145)
Total Bilirubin: 0.63 mg/dL (ref 0.20–1.20)
Total Protein: 6.5 g/dL (ref 6.4–8.3)

## 2016-04-02 NOTE — Telephone Encounter (Signed)
Gave patient AVS and calender per 04/02/2016 los. Central radiology to contact patient with CT schedule.

## 2016-04-02 NOTE — Progress Notes (Signed)
[facility] [phone]   [phone]  OFFICE PROGRESS NOTE  [name], MD [address] [place] [address]  DIAGNOSIS: Questionable lung cancer presented with left lower lobe and questionable mediastinal and hilar lymph nodes.  PRIOR THERAPY: None  CURRENT THERAPY: SBRT to the left lower lobe lung nodule under the care of Dr. Tammi Klippel. First fraction expected on 04/09/2016.  INTERVAL HISTORY: Kathy Pierce 83 y.o. female came to the clinic today for follow-up visit. The patient is feeling fine today with no specific complaints. She denied having any weight loss or night sweats. She has no chest pain, shortness of breath, cough or hemoptysis. She has no nausea or vomiting. Previous attempt for biopsy of the left lower lobe pulmonary nodule was aborted because of the increased risk. The patient was seen by Dr. Tammi Klippel and expected to start stereotactic body radiotherapy next week. She is here today for reevaluation.  MEDICAL HISTORY: Past Medical History:  Diagnosis Date  . Cancer (HCC)    Squamous cell carcinoma, skin, basal cell carcinoma  . Complication of anesthesia    wakes up slowly   . COPD (chronic obstructive pulmonary disease) ([place])   . Depression   . Essential thrombocythemia ([place])   . Hypertension   . Osteoporosis   . Sciatica of left side 06/16/2012  . Spinal stenosis of lumbar region 06/16/2012  . Vitamin B12 deficiency     ALLERGIES:  is allergic to penicillins; codeine; erythromycin; and sulfa drugs cross reactors.  MEDICATIONS:  Current Outpatient Prescriptions  Medication Sig Dispense Refill  . albuterol (PROVENTIL HFA;VENTOLIN HFA) 108 (90 Base) MCG/ACT inhaler Inhale 2 puffs into the lungs every 6 (six) hours as needed for wheezing or shortness of breath.    Marland Kitchen aspirin 81 MG tablet Take 81 mg by mouth daily.    Marland Kitchen atenolol (TENORMIN) 50 MG tablet Take 50 mg by mouth 2 (two) times daily.     .  benzonatate (TESSALON) 100 MG capsule Take 100 mg by mouth 3 (three) times daily as needed for cough.    . calcium carbonate (OS-CAL - DOSED IN MG OF ELEMENTAL CALCIUM) 1250 MG tablet Take 1 tablet by mouth 2 (two) times daily with a meal.     . cyanocobalamin (,VITAMIN B-12,) 1000 MCG/ML injection Inject 1,000 mcg into the muscle every 30 (thirty) days.    . fluticasone furoate-vilanterol (BREO ELLIPTA) 100-25 MCG/INH AEPB Inhale 1 puff into the lungs daily.    . hydrochlorothiazide (MICROZIDE) 12.5 MG capsule Take 12.5 mg by mouth daily.    . hydroxyurea (HYDREA) 500 MG capsule Take 500 mg by mouth every other day. May take with food to minimize GI side effects. Reports alternating one day one day then two tablets the next.    . Hypromellose (ARTIFICIAL TEARS OP) Place 1 drop into both eyes as needed (for dry eyes).    . meclizine (ANTIVERT) 25 MG tablet Take 25 mg by mouth 3 (three) times daily as needed for dizziness.    . sertraline (ZOLOFT) 50 MG tablet Take 50 mg by mouth daily.    . traMADol (ULTRAM) 50 MG tablet Take 50 mg by mouth every 8 (eight) hours as needed for moderate pain.   1   No current facility-administered medications for this visit.     SURGICAL HISTORY:  Past Surgical History:  Procedure Laterality Date  . APPENDECTOMY    . CATARACT EXTRACTION, BILATERAL Bilateral   .  STAPEDECTOMY Right   . TONSILLECTOMY    . VIDEO BRONCHOSCOPY WITH ENDOBRONCHIAL NAVIGATION N/A 02/11/2016   Procedure: VIDEO BRONCHOSCOPY WITH ENDOBRONCHIAL NAVIGATION;  Surgeon: Grace Isaac, MD;  Location: [facility];  Service: Thoracic;  Laterality: N/A;  . VIDEO BRONCHOSCOPY WITH ENDOBRONCHIAL ULTRASOUND N/A 02/11/2016   Procedure: VIDEO BRONCHOSCOPY WITH ENDOBRONCHIAL ULTRASOUND with biopsy of nodes #7 and #10;  Surgeon: Grace Isaac, MD;  Location: [facility];  Service: Thoracic;  Laterality: N/A;    REVIEW OF SYSTEMS:  A comprehensive review of systems was negative.   PHYSICAL EXAMINATION:  General appearance: alert, cooperative and no distress Head: Normocephalic, without obvious abnormality, atraumatic Neck: no adenopathy, no JVD, supple, symmetrical, trachea midline and thyroid not enlarged, symmetric, no tenderness/mass/nodules Lymph nodes: Cervical, supraclavicular, and axillary nodes normal. Resp: clear to auscultation bilaterally Back: symmetric, no curvature. ROM normal. No CVA tenderness. Cardio: regular rate and rhythm, S1, S2 normal, no murmur, click, rub or gallop GI: soft, non-tender; bowel sounds normal; no masses,  no organomegaly Extremities: extremities normal, atraumatic, no cyanosis or edema  ECOG PERFORMANCE STATUS: 1 - Symptomatic but completely ambulatory  Blood pressure 134/81, pulse 69, temperature 97.5 F (36.4 C), temperature source Oral, resp. rate 18, weight 112 lb 3.2 oz (50.9 kg), SpO2 98 %.  LABORATORY DATA: Lab Results  Component Value Date   WBC 6.9 04/02/2016   HGB 13.2 04/02/2016   HCT 40.1 04/02/2016   MCV 108.8 (H) 04/02/2016   PLT 465 (H) 04/02/2016      Chemistry      Component Value Date/Time   NA 139 02/11/2016 1156   K 3.9 02/11/2016 1156   CL 102 02/11/2016 1156   CO2 26 02/11/2016 1156   BUN 26 (H) 02/11/2016 1156   CREATININE 0.65 02/11/2016 1156      Component Value Date/Time   CALCIUM 9.4 02/11/2016 1156   ALKPHOS 58 02/11/2016 1156   AST 25 02/11/2016 1156   ALT 13 (L) 02/11/2016 1156   BILITOT 0.7 02/11/2016 1156       RADIOGRAPHIC STUDIES: No results found.  ASSESSMENT AND PLAN:  This is a very pleasant 83 years old white female with suspicious left lower lobe pulmonary nodule concerning for primary bronchogenic carcinoma. Unfortunately the CT-guided biopsy of this nodule was of high-risk. The patient was seen recently by Dr. Tammi Klippel and is considered for stereotactic body radiotherapy next week. I will arrange for the patient to come back for follow-up visit in 4 months with repeat CT scan of the chest  for reevaluation of her disease after the radiotherapy. She was advised to call immediately if she has any concerning symptoms in the interval. The patient voices understanding of current disease status and treatment options and is in agreement with the current care plan. All questions were answered. The patient knows to call the clinic with any problems, questions or concerns. We can certainly see the patient much sooner if necessary. I spent 10 minutes counseling the patient face to face. The total time spent in the appointment was 15 minutes.  Disclaimer: This note was dictated with voice recognition software. Similar sounding words can inadvertently be transcribed and may not be corrected upon review.

## 2016-04-09 ENCOUNTER — Ambulatory Visit
Admission: RE | Admit: 2016-04-09 | Discharge: 2016-04-09 | Disposition: A | Discharge status: Home / Self Care | Payer: Medicare Other | Source: Ambulatory Visit | Attending: Radiation Oncology | Admitting: Radiation Oncology

## 2016-04-09 DIAGNOSIS — Z510 Encounter for antineoplastic radiation therapy: Secondary | ICD-10-CM | POA: Diagnosis not present

## 2016-04-10 ENCOUNTER — Telehealth: Payer: Self-pay | Admitting: *Deleted

## 2016-04-10 ENCOUNTER — Ambulatory Visit: Payer: Medicare Other

## 2016-04-10 NOTE — Telephone Encounter (Signed)
Oncology Nurse Navigator Documentation  Oncology Nurse Navigator Flowsheets 04/10/2016  Navigator Location [facility]  Navigator Encounter Type Telephone/I called to follow up with Kathy Pierce. She is doing well with her treatment.  I listened as she explained.  No barriers identified at this time and asked that she call me if needed. I offered support and encouragement through treatment.  Telephone Outgoing Call  Treatment Phase Treatment  Barriers/Navigation Needs No barriers at this time  Interventions (No Data)  Acuity Level 1  Acuity Level 1 Minimal follow up required  Time Spent with Patient 15

## 2016-04-11 ENCOUNTER — Ambulatory Visit
Admission: RE | Admit: 2016-04-11 | Discharge: 2016-04-11 | Disposition: A | Discharge status: Home / Self Care | Payer: Medicare Other | Source: Ambulatory Visit | Attending: Radiation Oncology | Admitting: Radiation Oncology

## 2016-04-11 DIAGNOSIS — C3432 Malignant neoplasm of lower lobe, left bronchus or lung: Secondary | ICD-10-CM | POA: Diagnosis not present

## 2016-04-11 DIAGNOSIS — Z510 Encounter for antineoplastic radiation therapy: Secondary | ICD-10-CM | POA: Diagnosis not present

## 2016-04-14 ENCOUNTER — Ambulatory Visit: Payer: Medicare Other

## 2016-04-15 ENCOUNTER — Ambulatory Visit: Payer: Medicare Other

## 2016-04-16 ENCOUNTER — Other Ambulatory Visit: Payer: Self-pay | Admitting: Radiation Oncology

## 2016-04-16 ENCOUNTER — Ambulatory Visit
Admission: RE | Admit: 2016-04-16 | Discharge: 2016-04-16 | Disposition: A | Discharge status: Home / Self Care | Payer: Medicare Other | Source: Ambulatory Visit | Attending: Radiation Oncology | Admitting: Radiation Oncology

## 2016-04-16 DIAGNOSIS — Z510 Encounter for antineoplastic radiation therapy: Secondary | ICD-10-CM | POA: Diagnosis not present

## 2016-04-16 DIAGNOSIS — C3432 Malignant neoplasm of lower lobe, left bronchus or lung: Secondary | ICD-10-CM

## 2016-04-17 ENCOUNTER — Encounter: Payer: Self-pay | Admitting: Radiation Oncology

## 2016-04-17 NOTE — Progress Notes (Signed)
  Radiation Oncology         [phone] ________________________________  Name: Kathy Pierce MRN: [id number]  Date: 04/17/2016  DOB: 04-14-1925  End of Treatment Note  Diagnosis:   The encounter diagnosis was Primary cancer of left lower lobe of lung ([place]).     Indication for treatment:  Curative, Definitive SBRT       Radiation treatment dates:   04/09/16 - 04/16/16  Site/dose:   The target was treated to 54 Gy in 3 fractions of 18 Gy  Beams/energy:   The patient was treated using stereotactic body radiotherapy according to a 3D conformal radiotherapy plan.  Volumetric arc fields were employed to deliver 6 MV X-rays.  Image guidance was performed with per fraction cone beam CT prior to treatment under personal MD supervision.  Immobilization was achieved using BodyFix Pillow.  Narrative: The patient tolerated radiation treatment relatively well.   Pt was without complaints.  Plan: The patient has completed radiation treatment. The patient will return to radiation oncology clinic for routine followup in one month. I advised them to call or return sooner if they have any questions or concerns related to their recovery or treatment. ________________________________  Sheral Apley. Tammi Klippel, M.D.   This document serves as a record of services personally performed by Tyler Pita, MD. It was created on his behalf by Maryla Morrow, a trained medical scribe. The creation of this record is based on the scribe's personal observations and the provider's statements to them. This document has been checked and approved by the attending provider.

## 2016-04-18 ENCOUNTER — Ambulatory Visit: Payer: Medicare Other

## 2016-04-30 DIAGNOSIS — I10 Essential (primary) hypertension: Secondary | ICD-10-CM | POA: Diagnosis not present

## 2016-04-30 DIAGNOSIS — E538 Deficiency of other specified B group vitamins: Secondary | ICD-10-CM | POA: Diagnosis not present

## 2016-04-30 DIAGNOSIS — J449 Chronic obstructive pulmonary disease, unspecified: Secondary | ICD-10-CM | POA: Diagnosis not present

## 2016-04-30 DIAGNOSIS — D473 Essential (hemorrhagic) thrombocythemia: Secondary | ICD-10-CM | POA: Diagnosis not present

## 2016-05-01 ENCOUNTER — Telehealth: Payer: Self-pay | Admitting: Internal Medicine

## 2016-05-01 NOTE — Telephone Encounter (Signed)
FAXED RECORDS TO [facility]

## 2016-05-26 ENCOUNTER — Other Ambulatory Visit: Payer: Self-pay | Admitting: *Deleted

## 2016-05-26 ENCOUNTER — Ambulatory Visit (HOSPITAL_COMMUNITY)
Admission: RE | Admit: 2016-05-26 | Discharge: 2016-05-26 | Disposition: A | Discharge status: Home / Self Care | Payer: Medicare Other | Source: Ambulatory Visit | Attending: Radiation Oncology | Admitting: Radiation Oncology

## 2016-05-26 ENCOUNTER — Ambulatory Visit
Admission: RE | Admit: 2016-05-26 | Discharge: 2016-05-26 | Disposition: A | Discharge status: Home / Self Care | Payer: Medicare Other | Source: Ambulatory Visit | Attending: Radiation Oncology | Admitting: Radiation Oncology

## 2016-05-26 ENCOUNTER — Encounter (HOSPITAL_COMMUNITY): Payer: Self-pay

## 2016-05-26 DIAGNOSIS — K8020 Calculus of gallbladder without cholecystitis without obstruction: Secondary | ICD-10-CM | POA: Diagnosis not present

## 2016-05-26 DIAGNOSIS — C3490 Malignant neoplasm of unspecified part of unspecified bronchus or lung: Secondary | ICD-10-CM | POA: Diagnosis not present

## 2016-05-26 DIAGNOSIS — I700 Atherosclerosis of aorta: Secondary | ICD-10-CM | POA: Diagnosis not present

## 2016-05-26 DIAGNOSIS — I2510 Atherosclerotic heart disease of native coronary artery without angina pectoris: Secondary | ICD-10-CM | POA: Insufficient documentation

## 2016-05-26 DIAGNOSIS — J439 Emphysema, unspecified: Secondary | ICD-10-CM | POA: Insufficient documentation

## 2016-05-26 DIAGNOSIS — C3432 Malignant neoplasm of lower lobe, left bronchus or lung: Secondary | ICD-10-CM | POA: Insufficient documentation

## 2016-05-26 DIAGNOSIS — Z510 Encounter for antineoplastic radiation therapy: Secondary | ICD-10-CM | POA: Diagnosis not present

## 2016-05-26 LAB — BUN AND CREATININE (CC13)
BUN: 19.9 mg/dL (ref 7.0–26.0)
Creatinine: 0.7 mg/dL (ref 0.6–1.1)
EGFR: 72 mL/min/{1.73_m2} — ABNORMAL LOW (ref >90)

## 2016-05-26 MED ORDER — IOPAMIDOL (ISOVUE-300) INJECTION 61%
INTRAVENOUS | Status: AC
  Administered 2016-05-26: 75 mL via INTRAVENOUS
  Filled 2016-05-26: qty 75

## 2016-05-27 NOTE — Progress Notes (Addendum)
Kathy Pierce 83 y.o. woman with Primary cancer of left lower lobe of lung radiation completed 04-16-16,review 05-26-16 CT chest w contrast, one month FU.    Weight changes, if any: Wt Readings from Last 3 Encounters:  05/29/16 111 lb 9.6 oz (50.6 kg)  04/02/16 112 lb 3.2 oz (50.9 kg)  03/12/16 112 lb 6.4 oz (51 kg)   Respiratory complaints, if any: SOB using inhaler Breo,coughing clear secretion in the mornings Hemoptysis, if YKD:XIPJ   Swallowing Problems/Pain/Difficulty swallowing:No Smoking Tobacco/Marijuana/Snuff/ETOH ASN:KNLZJQ smoker x 21 years quit 07-10-76 1 P/D no drug or alcohol usuage Appetite :Fair "states she makes her self eat" three meals a day. Pain:None Skin normal to radiation site. When is next chemo scheduled?:No Imaging:No Lab work from of chart:05-26-16 BUN, creatinine 04-02-16 Saw Dr. Julien Nordmann I will arrange for the patient to come back for follow-up visit in 4 months with repeat CT scan of the chest for reevaluation of her disease after the radiotherapy. BP 120/72   Pulse 69   Temp 97.9 F (36.6 C) (Oral)   Resp 16   Ht 5' 4.5" (1.638 m)   Wt 111 lb 9.6 oz (50.6 kg)   SpO2 99%   BMI 18.86 kg/m

## 2016-05-28 NOTE — Progress Notes (Signed)
Radiation Oncology         [phone] ________________________________  Name: Kathy Pierce MRN: [id number]  Date: 05/29/2016  DOB: 01/19/1926  Post Treatment Note  CC: [name], MD  Curt Bears, MD  Diagnosis:   Putative stage IIIa cT1a N3 M0 primary bronchogenic carcinoma  Interval Since Last Radiation:  5 weeks  04/09/16 - 04/16/16:   The target was treated to 54 Gy in 3 fractions of 18 Gy   Narrative:  The patient returns today for routine follow-up.  She tolerated treatments very well and remained without complaints.  She had a post-treatment follow up CT Chest on 05/26/16 which reveals a slight decrease in the size of the LLL nodule.  There are no new lesions noted and no evidence of metastatic disease. She has a scheduled follow up appointment with Dr. Julien Nordmann on 08/07/16.                        On review of systems, the patient states that she is doing very well overall. She continues with decreased appetite but is eating 3 meals a day and maintaining her weight.  She denies dysphagia, abdominal pain, N/V/D or constipation.  She denies productive cough, hemoptysis, dyspnea, increased shortness of breath or chest pain. She has not had recent fever, chills or night sweats.  ALLERGIES:  is allergic to penicillins; codeine; erythromycin; and sulfa drugs cross reactors.  Meds: Current Outpatient Prescriptions  Medication Sig Dispense Refill  . albuterol (PROVENTIL HFA;VENTOLIN HFA) 108 (90 Base) MCG/ACT inhaler Inhale 2 puffs into the lungs every 6 (six) hours as needed for wheezing or shortness of breath.    Marland Kitchen aspirin 81 MG tablet Take 81 mg by mouth daily.    Marland Kitchen atenolol (TENORMIN) 50 MG tablet Take 50 mg by mouth 2 (two) times daily.     . benzonatate (TESSALON) 100 MG capsule Take 100 mg by mouth 3 (three) times daily as needed for cough.    . calcium carbonate (OS-CAL - DOSED IN MG OF ELEMENTAL CALCIUM) 1250 MG tablet Take 1 tablet by mouth 2 (two) times daily with a  meal.     . cyanocobalamin (,VITAMIN B-12,) 1000 MCG/ML injection Inject 1,000 mcg into the muscle every 30 (thirty) days.    . fluticasone furoate-vilanterol (BREO ELLIPTA) 100-25 MCG/INH AEPB Inhale 1 puff into the lungs daily.    . hydrochlorothiazide (MICROZIDE) 12.5 MG capsule Take 12.5 mg by mouth daily.    . hydroxyurea (HYDREA) 500 MG capsule Take 500 mg by mouth every other day. May take with food to minimize GI side effects. Reports alternating one day one day then two tablets the next.    . Hypromellose (ARTIFICIAL TEARS OP) Place 1 drop into both eyes as needed (for dry eyes).    . sertraline (ZOLOFT) 50 MG tablet Take 50 mg by mouth daily.    . meclizine (ANTIVERT) 25 MG tablet Take 25 mg by mouth 3 (three) times daily as needed for dizziness.    . traMADol (ULTRAM) 50 MG tablet Take 50 mg by mouth every 8 (eight) hours as needed for moderate pain.   1   No current facility-administered medications for this encounter.     Physical Findings:  height is 5' 4.5" (1.638 m) and weight is 111 lb 9.6 oz (50.6 kg). Her oral temperature is 97.9 F (36.6 C). Her blood pressure is 120/72 and her pulse is 69. Her respiration is 16 and oxygen  saturation is 99%.  Pain Assessment Pain Score: 0-No pain/10 In general this is a well appearing caucasian female in no acute distress. She's alert and oriented x4 and appropriate throughout the examination. Cardiopulmonary assessment is negative for acute distress and she exhibits normal effort.   Lab Findings: Lab Results  Component Value Date   WBC 6.9 04/02/2016   HGB 13.2 04/02/2016   HCT 40.1 04/02/2016   MCV 108.8 (H) 04/02/2016   PLT 465 (H) 04/02/2016     Radiographic Findings: Ct Chest W Contrast  Result Date: 05/26/2016 CLINICAL DATA:  Lung cancer, radiation therapy complete. EXAM: CT CHEST WITH CONTRAST TECHNIQUE: Multidetector CT imaging of the chest was performed during intravenous contrast administration. CONTRAST:  17m  ISOVUE-300 IOPAMIDOL (ISOVUE-300) INJECTION 61% COMPARISON:  PET 01/18/2016 and CT chest 01/03/2016. FINDINGS: Cardiovascular: Atherosclerotic calcification of the arterial vasculature, including coronary arteries. Heart is enlarged. No pericardial effusion. Mediastinum/Nodes: Mediastinal and hilar lymph nodes are not enlarged by CT size criteria. No axillary adenopathy. Esophagus is unremarkable. Lungs/Pleura: Moderate centrilobular emphysema. Biapical pleuroparenchymal scarring. Spiculated nodule in the superior segment left lower lobe measures 11 x 13 mm, previously 14 x 16 mm. There is retraction of the adjacent left major fissure. Fiducial markers are seen slightly inferiorly within the superior segment left lower lobe. No pleural fluid. Airway is unremarkable. Upper Abdomen: Visualized portion of the liver is unremarkable. Stones layer in the gallbladder. Visualized portions of the pancreas and stomach are unremarkable. Probable partially imaged duodenal diverticulum in the right upper quadrant. No upper abdominal adenopathy. Musculoskeletal: No worrisome lytic or sclerotic lesions. Degenerative changes are seen in the spine. Old right second rib fracture. IMPRESSION: 1. Slight decrease in size of a spiculated nodule in the left lower lobe. No evidence of metastatic disease. 2. Aortic atherosclerosis (ICD10-170.0). Coronary artery calcification. 3.  Emphysema (ICD10-J43.9). 4. Cholelithiasis. Electronically Signed   By: MLorin PicketM.D.   On: 05/26/2016 13:14    Impression/Plan: 1. Putative stage IIIa cT1a N3 M0 primary bronchogenic carcinoma.   The patient appears to be doing well following radiotherapy. Recent post-treatment CT chest demonstrates a good response to treatment with a decrease in size of the treated nodule in the LLL, no new lesions and no evidence of metastatic disease.  She has a scheduled follow up appointment with Dr. MJulien Nordmann7/12/18. At this point, she will continue her routine  follow up with Dr. MJulien Nordmannin medical oncology for surveillance. We are happy to continue to participate in her care as needed.  She knows to call at any time with any questions or concerns related to her radiotherapy.    ANicholos Johns PA-C

## 2016-05-29 ENCOUNTER — Ambulatory Visit
Admission: RE | Admit: 2016-05-29 | Discharge: 2016-05-29 | Disposition: A | Discharge status: Home / Self Care | Payer: Medicare Other | Source: Ambulatory Visit | Attending: Urology | Admitting: Urology

## 2016-05-29 ENCOUNTER — Encounter: Payer: Self-pay | Admitting: Urology

## 2016-05-29 VITALS — BP 120/72 | HR 69 | Temp 97.9°F | Resp 16 | Ht 64.5 in | Wt 111.6 lb

## 2016-05-29 DIAGNOSIS — Z7982 Long term (current) use of aspirin: Secondary | ICD-10-CM | POA: Insufficient documentation

## 2016-05-29 DIAGNOSIS — Z79899 Other long term (current) drug therapy: Secondary | ICD-10-CM | POA: Diagnosis not present

## 2016-05-29 DIAGNOSIS — Z880 Allergy status to penicillin: Secondary | ICD-10-CM | POA: Diagnosis not present

## 2016-05-29 DIAGNOSIS — Z885 Allergy status to narcotic agent status: Secondary | ICD-10-CM | POA: Insufficient documentation

## 2016-05-29 DIAGNOSIS — C3490 Malignant neoplasm of unspecified part of unspecified bronchus or lung: Secondary | ICD-10-CM | POA: Diagnosis not present

## 2016-05-29 DIAGNOSIS — C3432 Malignant neoplasm of lower lobe, left bronchus or lung: Secondary | ICD-10-CM

## 2016-06-04 ENCOUNTER — Other Ambulatory Visit: Payer: Self-pay | Admitting: Family Medicine

## 2016-06-04 DIAGNOSIS — I517 Cardiomegaly: Secondary | ICD-10-CM | POA: Diagnosis not present

## 2016-06-04 DIAGNOSIS — I700 Atherosclerosis of aorta: Secondary | ICD-10-CM | POA: Diagnosis not present

## 2016-06-04 DIAGNOSIS — C3432 Malignant neoplasm of lower lobe, left bronchus or lung: Secondary | ICD-10-CM | POA: Diagnosis not present

## 2016-06-04 DIAGNOSIS — J449 Chronic obstructive pulmonary disease, unspecified: Secondary | ICD-10-CM | POA: Diagnosis not present

## 2016-06-11 DIAGNOSIS — H01004 Unspecified blepharitis left upper eyelid: Secondary | ICD-10-CM | POA: Diagnosis not present

## 2016-06-11 DIAGNOSIS — H01001 Unspecified blepharitis right upper eyelid: Secondary | ICD-10-CM | POA: Diagnosis not present

## 2016-06-11 DIAGNOSIS — H01002 Unspecified blepharitis right lower eyelid: Secondary | ICD-10-CM | POA: Diagnosis not present

## 2016-06-11 DIAGNOSIS — H353132 Nonexudative age-related macular degeneration, bilateral, intermediate dry stage: Secondary | ICD-10-CM | POA: Diagnosis not present

## 2016-06-16 ENCOUNTER — Other Ambulatory Visit: Payer: Self-pay

## 2016-06-16 ENCOUNTER — Ambulatory Visit (HOSPITAL_COMMUNITY): Payer: Medicare Other | Attending: Cardiology

## 2016-06-16 DIAGNOSIS — I517 Cardiomegaly: Secondary | ICD-10-CM

## 2016-06-16 DIAGNOSIS — I10 Essential (primary) hypertension: Secondary | ICD-10-CM | POA: Insufficient documentation

## 2016-06-16 DIAGNOSIS — I083 Combined rheumatic disorders of mitral, aortic and tricuspid valves: Secondary | ICD-10-CM | POA: Insufficient documentation

## 2016-06-16 DIAGNOSIS — J449 Chronic obstructive pulmonary disease, unspecified: Secondary | ICD-10-CM | POA: Insufficient documentation

## 2016-07-16 DIAGNOSIS — T148XXA Other injury of unspecified body region, initial encounter: Secondary | ICD-10-CM | POA: Diagnosis not present

## 2016-07-31 ENCOUNTER — Other Ambulatory Visit (HOSPITAL_BASED_OUTPATIENT_CLINIC_OR_DEPARTMENT_OTHER): Payer: Medicare Other

## 2016-07-31 ENCOUNTER — Ambulatory Visit (HOSPITAL_COMMUNITY)
Admission: RE | Admit: 2016-07-31 | Discharge: 2016-07-31 | Disposition: A | Discharge status: Home / Self Care | Payer: Medicare Other | Source: Ambulatory Visit | Attending: Internal Medicine | Admitting: Internal Medicine

## 2016-07-31 ENCOUNTER — Ambulatory Visit (HOSPITAL_COMMUNITY): Payer: Medicare Other

## 2016-07-31 DIAGNOSIS — C3432 Malignant neoplasm of lower lobe, left bronchus or lung: Secondary | ICD-10-CM

## 2016-07-31 DIAGNOSIS — I2510 Atherosclerotic heart disease of native coronary artery without angina pectoris: Secondary | ICD-10-CM | POA: Diagnosis not present

## 2016-07-31 DIAGNOSIS — K8020 Calculus of gallbladder without cholecystitis without obstruction: Secondary | ICD-10-CM | POA: Diagnosis not present

## 2016-07-31 DIAGNOSIS — J432 Centrilobular emphysema: Secondary | ICD-10-CM | POA: Diagnosis not present

## 2016-07-31 DIAGNOSIS — K228 Other specified diseases of esophagus: Secondary | ICD-10-CM | POA: Insufficient documentation

## 2016-07-31 DIAGNOSIS — R911 Solitary pulmonary nodule: Secondary | ICD-10-CM | POA: Diagnosis not present

## 2016-07-31 DIAGNOSIS — I700 Atherosclerosis of aorta: Secondary | ICD-10-CM | POA: Diagnosis not present

## 2016-07-31 LAB — CBC WITH DIFFERENTIAL/PLATELET
BASO%: 0.2 % (ref 0.0–2.0)
BASOS ABS: 0 10*3/uL (ref 0.0–0.1)
EOS%: 2.2 % (ref 0.0–7.0)
Eosinophils Absolute: 0.1 10*3/uL (ref 0.0–0.5)
HEMATOCRIT: 40.3 % (ref 34.8–46.6)
HEMOGLOBIN: 13.1 g/dL (ref 11.6–15.9)
LYMPH#: 0.6 10*3/uL — AB (ref 0.9–3.3)
LYMPH%: 9.8 % — ABNORMAL LOW (ref 14.0–49.7)
MCH: 36.8 pg — AB (ref 25.1–34.0)
MCHC: 32.5 g/dL (ref 31.5–36.0)
MCV: 113.2 fL — ABNORMAL HIGH (ref 79.5–101.0)
MONO#: 1.2 10*3/uL — ABNORMAL HIGH (ref 0.1–0.9)
MONO%: 20.2 % — AB (ref 0.0–14.0)
NEUT%: 67.6 % (ref 38.4–76.8)
NEUTROS ABS: 3.9 10*3/uL (ref 1.5–6.5)
Platelets: 382 10*3/uL (ref 145–400)
RBC: 3.56 10*6/uL — ABNORMAL LOW (ref 3.70–5.45)
RDW: 15.1 % — AB (ref 11.2–14.5)
WBC: 5.8 10*3/uL (ref 3.9–10.3)

## 2016-07-31 LAB — COMPREHENSIVE METABOLIC PANEL
ALBUMIN: 3.6 g/dL (ref 3.5–5.0)
ALK PHOS: 59 U/L (ref 40–150)
ALT: 10 U/L (ref 0–55)
AST: 20 U/L (ref 5–34)
Anion Gap: 10 mEq/L (ref 3–11)
BILIRUBIN TOTAL: 0.55 mg/dL (ref 0.20–1.20)
BUN: 13.6 mg/dL (ref 7.0–26.0)
CALCIUM: 9.7 mg/dL (ref 8.4–10.4)
CO2: 30 mEq/L — ABNORMAL HIGH (ref 22–29)
CREATININE: 0.8 mg/dL (ref 0.6–1.1)
Chloride: 101 mEq/L (ref 98–109)
EGFR: 71 mL/min/{1.73_m2} — ABNORMAL LOW (ref >90)
GLUCOSE: 100 mg/dL (ref 70–140)
Potassium: 4 mEq/L (ref 3.5–5.1)
SODIUM: 142 meq/L (ref 136–145)
TOTAL PROTEIN: 6.7 g/dL (ref 6.4–8.3)

## 2016-07-31 MED ORDER — IOPAMIDOL (ISOVUE-300) INJECTION 61%
INTRAVENOUS | Status: AC
  Filled 2016-07-31: qty 75

## 2016-07-31 MED ORDER — IOPAMIDOL (ISOVUE-300) INJECTION 61%: 75.0000 mL | Freq: Once | INTRAVENOUS | Status: DC | PRN

## 2016-08-07 ENCOUNTER — Telehealth: Payer: Self-pay | Admitting: Internal Medicine

## 2016-08-07 ENCOUNTER — Ambulatory Visit (HOSPITAL_BASED_OUTPATIENT_CLINIC_OR_DEPARTMENT_OTHER): Payer: Medicare Other | Admitting: Internal Medicine

## 2016-08-07 ENCOUNTER — Encounter: Payer: Self-pay | Admitting: Internal Medicine

## 2016-08-07 VITALS — BP 134/56 | HR 68 | Temp 97.6°F | Resp 18 | Ht 64.5 in | Wt 113.5 lb

## 2016-08-07 DIAGNOSIS — R911 Solitary pulmonary nodule: Secondary | ICD-10-CM | POA: Diagnosis not present

## 2016-08-07 DIAGNOSIS — C3432 Malignant neoplasm of lower lobe, left bronchus or lung: Secondary | ICD-10-CM

## 2016-08-07 NOTE — Telephone Encounter (Signed)
Scheduled appt per 7/12 los - Gave patient AVS and calender per [facility] to contact patinet with CT schedule

## 2016-08-07 NOTE — Progress Notes (Signed)
[facility] [phone]   [phone]  OFFICE PROGRESS NOTE  [name], MD [name] [address]  DIAGNOSIS: Questionable lung cancer presented with left lower lobe and questionable mediastinal and hilar lymph nodes.  PRIOR THERAPY: None  CURRENT THERAPY: SBRT to the left lower lobe lung nodule under the care of Dr. Tammi Klippel completed on 04/16/2016.   INTERVAL HISTORY: Kathy Pierce 83 y.o. female returns to the clinic today for follow-up visit. The patient is feeling fine today with no specific complaints except for dry cough with occasional thick mucus. She denied having any shortness of breath except with exertion. She has no weight loss or night sweats. She denied having any chest pain or hemoptysis. The patient has no fever or chills. She has no nausea, vomiting, diarrhea or constipation. She had repeat CT scan of the chest performed recently and she is here for evaluation and discussion of her scan results.   MEDICAL HISTORY: Past Medical History:  Diagnosis Date  . Cancer (HCC)    Squamous cell carcinoma, skin, basal cell carcinoma  . Complication of anesthesia    wakes up slowly   . COPD (chronic obstructive pulmonary disease) ([place])   . Depression   . Essential thrombocythemia ([place])   . Hypertension   . Osteoporosis   . Sciatica of left side 06/16/2012  . Spinal stenosis of lumbar region 06/16/2012  . Vitamin B12 deficiency     ALLERGIES:  is allergic to penicillins; codeine; erythromycin; and sulfa drugs cross reactors.  MEDICATIONS:  Current Outpatient Prescriptions  Medication Sig Dispense Refill  . albuterol (PROVENTIL HFA;VENTOLIN HFA) 108 (90 Base) MCG/ACT inhaler Inhale 2 puffs into the lungs every 6 (six) hours as needed for wheezing or shortness of breath.    Marland Kitchen aspirin 81 MG tablet Take 81 mg by mouth daily.    Marland Kitchen atenolol (TENORMIN) 50 MG tablet Take 50 mg by mouth 2 (two) times daily.      . benzonatate (TESSALON) 100 MG capsule Take 100 mg by mouth 3 (three) times daily as needed for cough.    . calcium carbonate (OS-CAL - DOSED IN MG OF ELEMENTAL CALCIUM) 1250 MG tablet Take 1 tablet by mouth 2 (two) times daily with a meal.     . cyanocobalamin (,VITAMIN B-12,) 1000 MCG/ML injection Inject 1,000 mcg into the muscle every 30 (thirty) days.    . fluticasone furoate-vilanterol (BREO ELLIPTA) 100-25 MCG/INH AEPB Inhale 1 puff into the lungs daily.    . hydrochlorothiazide (MICROZIDE) 12.5 MG capsule Take 12.5 mg by mouth daily.    . hydroxyurea (HYDREA) 500 MG capsule Take 500 mg by mouth every other day. May take with food to minimize GI side effects. Reports alternating one day one day then two tablets the next.    . Hypromellose (ARTIFICIAL TEARS OP) Place 1 drop into both eyes as needed (for dry eyes).    . meclizine (ANTIVERT) 25 MG tablet Take 25 mg by mouth 3 (three) times daily as needed for dizziness.    . sertraline (ZOLOFT) 50 MG tablet Take 50 mg by mouth daily.    . traMADol (ULTRAM) 50 MG tablet Take 50 mg by mouth every 8 (eight) hours as needed for moderate pain.   1   No current facility-administered medications for this visit.     SURGICAL HISTORY:  Past Surgical History:  Procedure Laterality Date  . APPENDECTOMY    . CATARACT  EXTRACTION, BILATERAL Bilateral   . STAPEDECTOMY Right   . TONSILLECTOMY    . VIDEO BRONCHOSCOPY WITH ENDOBRONCHIAL NAVIGATION N/A 02/11/2016   Procedure: VIDEO BRONCHOSCOPY WITH ENDOBRONCHIAL NAVIGATION;  Surgeon: Grace Isaac, MD;  Location: [facility];  Service: Thoracic;  Laterality: N/A;  . VIDEO BRONCHOSCOPY WITH ENDOBRONCHIAL ULTRASOUND N/A 02/11/2016   Procedure: VIDEO BRONCHOSCOPY WITH ENDOBRONCHIAL ULTRASOUND with biopsy of nodes #7 and #10;  Surgeon: Grace Isaac, MD;  Location: [facility];  Service: Thoracic;  Laterality: N/A;    REVIEW OF SYSTEMS:  A comprehensive review of systems was negative except for: Respiratory:  positive for cough and dyspnea on exertion   PHYSICAL EXAMINATION: General appearance: alert, cooperative and no distress Head: Normocephalic, without obvious abnormality, atraumatic Neck: no adenopathy, no JVD, supple, symmetrical, trachea midline and thyroid not enlarged, symmetric, no tenderness/mass/nodules Lymph nodes: Cervical, supraclavicular, and axillary nodes normal. Resp: clear to auscultation bilaterally Back: symmetric, no curvature. ROM normal. No CVA tenderness. Cardio: regular rate and rhythm, S1, S2 normal, no murmur, click, rub or gallop GI: soft, non-tender; bowel sounds normal; no masses,  no organomegaly Extremities: extremities normal, atraumatic, no cyanosis or edema  ECOG PERFORMANCE STATUS: 1 - Symptomatic but completely ambulatory  Blood pressure (!) 134/56, pulse 68, temperature 97.6 F (36.4 C), temperature source Oral, resp. rate 18, height 5' 4.5" (1.638 m), weight 113 lb 8 oz (51.5 kg), SpO2 95 %.  LABORATORY DATA: Lab Results  Component Value Date   WBC 5.8 07/31/2016   HGB 13.1 07/31/2016   HCT 40.3 07/31/2016   MCV 113.2 (H) 07/31/2016   PLT 382 07/31/2016      Chemistry      Component Value Date/Time   NA 142 07/31/2016 1006   K 4.0 07/31/2016 1006   CL 102 02/11/2016 1156   CO2 30 (H) 07/31/2016 1006   BUN 13.6 07/31/2016 1006   CREATININE 0.8 07/31/2016 1006      Component Value Date/Time   CALCIUM 9.7 07/31/2016 1006   ALKPHOS 59 07/31/2016 1006   AST 20 07/31/2016 1006   ALT 10 07/31/2016 1006   BILITOT 0.55 07/31/2016 1006       RADIOGRAPHIC STUDIES: Ct Chest W Contrast  Result Date: 07/31/2016 CLINICAL DATA:  Presumed left lower lobe primary bronchogenic carcinoma treated with SBRT completed 04/16/2016. Patient presents for restaging. EXAM: CT CHEST WITH CONTRAST TECHNIQUE: Multidetector CT imaging of the chest was performed during intravenous contrast administration. CONTRAST:  75 cc Isovue-300 IV. COMPARISON:  05/26/2016  chest CT. FINDINGS: Cardiovascular: Stable top-normal heart size. No significant pericardial fluid/thickening. Left anterior descending, left circumflex and right coronary atherosclerosis. Atherosclerotic nonaneurysmal thoracic aorta. Normal caliber pulmonary arteries. No central pulmonary emboli. Mediastinum/Nodes: No discrete thyroid nodules. Stable mild circumferential wall thickening in the lower thoracic esophagus. No pathologically enlarged axillary, mediastinal or hilar lymph nodes. Lungs/Pleura: No pneumothorax. No pleural effusion. Moderate to severe centrilobular emphysema with diffuse bronchial wall thickening, suggesting COPD. Stable scattered subcentimeter calcified granulomas in both lungs. Spiculated 1.3 x 1.0 cm anterior left lower lobe solid pulmonary nodule (series 7/ image 61), previously 1.4 x 1.1 cm using similar measurement technique, slightly decreased. New 4 mm subpleural basilar right upper lobe solid pulmonary nodule (series 7/image 87). No acute consolidative airspace disease or additional significant pulmonary nodules. Upper abdomen: Cholelithiasis. Musculoskeletal: No aggressive appearing focal osseous lesions. Moderate thoracic spondylosis. IMPRESSION: 1. Continued slight reduction in the size of the treated spiculated anterior left lower lobe pulmonary nodule. 2. No definite findings of  metastatic disease in the chest. Solitary new 4 mm subpleural right upper lobe solid pulmonary nodule, which warrants attention on follow-up chest CT in 3-6 months. 3. Three-vessel coronary atherosclerosis . 4. Stable mild circumferential wall thickening in the lower thoracic esophagus, nonspecific, most commonly due to reflux esophagitis . 5. Cholelithiasis. Aortic Atherosclerosis (ICD10-I70.0) and Emphysema (ICD10-J43.9). Electronically Signed   By: Ilona Sorrel M.D.   On: 07/31/2016 12:28    ASSESSMENT AND PLAN:  This is a very pleasant 83 years old white female with suspicious primary  bronchogenic carcinoma involving the left lower lobe with a pulmonary nodule status post stereotactic radiotherapy under the care of Dr. Tammi Klippel. The patient is feeling much better today with no specific complaints She had repeat CT scan of the chest that showed improvement in the left upper lobe pulmonary nodule but there was a new solitary 4 mm subpleural right upper lobe solid concerning for synchronous disease. I discussed the scan results with the patient today. I recommended for her to continue on observation with repeat CT scan of the chest in 3 months for reevaluation of this nodule. She was advised to call immediately if she has any concerning symptoms in the interval. The patient voices understanding of current disease status and treatment options and is in agreement with the current care plan. All questions were answered. The patient knows to call the clinic with any problems, questions or concerns. We can certainly see the patient much sooner if necessary. I spent 10 minutes counseling the patient face to face. The total time spent in the appointment was 15 minutes.  Disclaimer: This note was dictated with voice recognition software. Similar sounding words can inadvertently be transcribed and may not be corrected upon review.

## 2016-08-08 NOTE — Addendum Note (Signed)
Addendum  created 08/08/16 1036 by Lyndle Herrlich, MD   Sign clinical note

## 2016-08-08 NOTE — Anesthesia Postprocedure Evaluation (Signed)
Anesthesia Post Note  Patient: Kathy Pierce  Procedure(s) Performed: Procedure(s) (LRB): VIDEO BRONCHOSCOPY WITH ENDOBRONCHIAL ULTRASOUND with biopsy of nodes #7 and #10 (N/A) VIDEO BRONCHOSCOPY WITH ENDOBRONCHIAL NAVIGATION (N/A) PLACEMENT OF FIDUCIAL Markers times three      Anesthesia Post Evaluation  Last Vitals:  Vitals:   02/11/16 1733 02/11/16 1750  BP: (!) 116/97 116/60  Pulse: 67 64  Resp: (!) 21   Temp:      Last Pain:  Vitals:   02/11/16 1750  TempSrc:   PainSc: 0-No pain             temp 36.2 on arrival    [employer]

## 2016-09-25 DIAGNOSIS — N6459 Other signs and symptoms in breast: Secondary | ICD-10-CM | POA: Diagnosis not present

## 2016-09-25 DIAGNOSIS — N6323 Unspecified lump in the left breast, lower outer quadrant: Secondary | ICD-10-CM | POA: Diagnosis not present

## 2016-10-21 DIAGNOSIS — Z85828 Personal history of other malignant neoplasm of skin: Secondary | ICD-10-CM | POA: Diagnosis not present

## 2016-10-21 DIAGNOSIS — C44729 Squamous cell carcinoma of skin of left lower limb, including hip: Secondary | ICD-10-CM | POA: Diagnosis not present

## 2016-10-21 DIAGNOSIS — D485 Neoplasm of uncertain behavior of skin: Secondary | ICD-10-CM | POA: Diagnosis not present

## 2016-10-21 DIAGNOSIS — L570 Actinic keratosis: Secondary | ICD-10-CM | POA: Diagnosis not present

## 2016-10-21 DIAGNOSIS — D1801 Hemangioma of skin and subcutaneous tissue: Secondary | ICD-10-CM | POA: Diagnosis not present

## 2016-10-24 DIAGNOSIS — Z23 Encounter for immunization: Secondary | ICD-10-CM | POA: Diagnosis not present

## 2016-11-05 ENCOUNTER — Other Ambulatory Visit (HOSPITAL_BASED_OUTPATIENT_CLINIC_OR_DEPARTMENT_OTHER): Payer: Medicare Other

## 2016-11-05 ENCOUNTER — Other Ambulatory Visit: Payer: Self-pay

## 2016-11-05 ENCOUNTER — Encounter (HOSPITAL_COMMUNITY): Payer: Self-pay

## 2016-11-05 ENCOUNTER — Ambulatory Visit (HOSPITAL_COMMUNITY)
Admission: RE | Admit: 2016-11-05 | Discharge: 2016-11-05 | Disposition: A | Discharge status: Home / Self Care | Payer: Medicare Other | Source: Ambulatory Visit | Attending: Internal Medicine | Admitting: Internal Medicine

## 2016-11-05 DIAGNOSIS — C3432 Malignant neoplasm of lower lobe, left bronchus or lung: Secondary | ICD-10-CM | POA: Insufficient documentation

## 2016-11-05 DIAGNOSIS — C3490 Malignant neoplasm of unspecified part of unspecified bronchus or lung: Secondary | ICD-10-CM | POA: Diagnosis not present

## 2016-11-05 DIAGNOSIS — J439 Emphysema, unspecified: Secondary | ICD-10-CM | POA: Diagnosis not present

## 2016-11-05 DIAGNOSIS — I700 Atherosclerosis of aorta: Secondary | ICD-10-CM | POA: Insufficient documentation

## 2016-11-05 HISTORY — DX: Malignant neoplasm of unspecified part of unspecified bronchus or lung: C34.90

## 2016-11-05 LAB — COMPREHENSIVE METABOLIC PANEL
ALT: 9 U/L (ref 0–55)
ANION GAP: 8 meq/L (ref 3–11)
AST: 20 U/L (ref 5–34)
Albumin: 3.4 g/dL — ABNORMAL LOW (ref 3.5–5.0)
Alkaline Phosphatase: 58 U/L (ref 40–150)
BUN: 19.1 mg/dL (ref 7.0–26.0)
CALCIUM: 9.4 mg/dL (ref 8.4–10.4)
CHLORIDE: 101 meq/L (ref 98–109)
CO2: 28 mEq/L (ref 22–29)
CREATININE: 0.7 mg/dL (ref 0.6–1.1)
EGFR: >60 mL/min/{1.73_m2} (ref >60)
Glucose: 93 mg/dl (ref 70–140)
POTASSIUM: 4.4 meq/L (ref 3.5–5.1)
Sodium: 137 mEq/L (ref 136–145)
Total Bilirubin: 0.45 mg/dL (ref 0.20–1.20)
Total Protein: 6.6 g/dL (ref 6.4–8.3)

## 2016-11-05 LAB — CBC WITH DIFFERENTIAL/PLATELET
BASO%: 0.2 % (ref 0.0–2.0)
BASOS ABS: 0 10*3/uL (ref 0.0–0.1)
EOS ABS: 0.2 10*3/uL (ref 0.0–0.5)
EOS%: 2.6 % (ref 0.0–7.0)
HEMATOCRIT: 39.2 % (ref 34.8–46.6)
HGB: 13 g/dL (ref 11.6–15.9)
LYMPH#: 0.4 10*3/uL — AB (ref 0.9–3.3)
LYMPH%: 7 % — ABNORMAL LOW (ref 14.0–49.7)
MCH: 36.6 pg — AB (ref 25.1–34.0)
MCHC: 33.1 g/dL (ref 31.5–36.0)
MCV: 110.7 fL — ABNORMAL HIGH (ref 79.5–101.0)
MONO#: 1.6 10*3/uL — AB (ref 0.1–0.9)
MONO%: 25.9 % — ABNORMAL HIGH (ref 0.0–14.0)
NEUT#: 4.1 10*3/uL (ref 1.5–6.5)
NEUT%: 64.3 % (ref 38.4–76.8)
PLATELETS: 362 10*3/uL (ref 145–400)
RBC: 3.54 10*6/uL — ABNORMAL LOW (ref 3.70–5.45)
RDW: 15.5 % — ABNORMAL HIGH (ref 11.2–14.5)
WBC: 6.3 10*3/uL (ref 3.9–10.3)

## 2016-11-05 MED ORDER — IOPAMIDOL (ISOVUE-300) INJECTION 61%
INTRAVENOUS | Status: AC
  Administered 2016-11-05: 75 mL via INTRAVENOUS
  Filled 2016-11-05: qty 75

## 2016-11-05 MED ORDER — IOPAMIDOL (ISOVUE-300) INJECTION 61%
75.0000 mL | Freq: Once | INTRAVENOUS | Status: AC | PRN
  Administered 2016-11-05: 75 mL via INTRAVENOUS

## 2016-11-12 ENCOUNTER — Ambulatory Visit: Payer: Self-pay | Admitting: Internal Medicine

## 2016-11-17 DIAGNOSIS — Z0000 Encounter for general adult medical examination without abnormal findings: Secondary | ICD-10-CM | POA: Diagnosis not present

## 2016-11-17 DIAGNOSIS — M810 Age-related osteoporosis without current pathological fracture: Secondary | ICD-10-CM | POA: Diagnosis not present

## 2016-11-17 DIAGNOSIS — N390 Urinary tract infection, site not specified: Secondary | ICD-10-CM | POA: Diagnosis not present

## 2016-11-17 DIAGNOSIS — D473 Essential (hemorrhagic) thrombocythemia: Secondary | ICD-10-CM | POA: Diagnosis not present

## 2016-11-17 DIAGNOSIS — I10 Essential (primary) hypertension: Secondary | ICD-10-CM | POA: Diagnosis not present

## 2016-11-18 ENCOUNTER — Encounter: Payer: Self-pay | Admitting: Internal Medicine

## 2016-11-18 ENCOUNTER — Ambulatory Visit (HOSPITAL_BASED_OUTPATIENT_CLINIC_OR_DEPARTMENT_OTHER): Payer: Medicare Other | Admitting: Internal Medicine

## 2016-11-18 VITALS — BP 138/65 | HR 82 | Temp 98.0°F | Resp 18 | Ht 64.5 in | Wt 111.4 lb

## 2016-11-18 DIAGNOSIS — D473 Essential (hemorrhagic) thrombocythemia: Secondary | ICD-10-CM | POA: Diagnosis not present

## 2016-11-18 DIAGNOSIS — C3432 Malignant neoplasm of lower lobe, left bronchus or lung: Secondary | ICD-10-CM | POA: Diagnosis not present

## 2016-11-18 DIAGNOSIS — C3490 Malignant neoplasm of unspecified part of unspecified bronchus or lung: Secondary | ICD-10-CM

## 2016-11-18 NOTE — Progress Notes (Signed)
[place] [phone]   [phone]  OFFICE PROGRESS NOTE  [name], MD [address] [place] [address]  DIAGNOSIS:  1) Questionable lung cancer presented with left lower lobe and questionable mediastinal and hilar lymph nodes. 2) essential thrombocythemia.  PRIOR THERAPY: SBRT to the left lower lobe lung nodule under the care of Dr. Tammi Klippel completed on 04/16/2016.  CURRENT THERAPY: hydroxyurea 500 mg by mouth daily.  INTERVAL HISTORY: Kathy Pierce 83 y.o. female Returns to the clinic today for follow-up visit. The patient is feeling fine today with no specific complaints. She denied having any recent chest pain, shortness of breath, cough or hemoptysis. She denied having any weight loss or night sweats. The patient denied having any current nausea, vomiting, diarrhea or constipation. She has been on treatment with hydroxyurea 500 mg by mouth daily for several years for essential thrombocythemia. She had repeat CT scan of the chest performed recently and she is here for evaluation and discussion of her scan results.   MEDICAL HISTORY: Past Medical History:  Diagnosis Date  . Cancer (HCC)    Squamous cell carcinoma, skin, basal cell carcinoma  . Complication of anesthesia    wakes up slowly   . COPD (chronic obstructive pulmonary disease) ([facility])   . Depression   . Essential thrombocythemia ([place])   . Hypertension   . Lung cancer ([facility]) dx'd 2017  . Osteoporosis   . Sciatica of left side 06/16/2012  . Spinal stenosis of lumbar region 06/16/2012  . Vitamin B12 deficiency     ALLERGIES:  is allergic to penicillins; codeine; erythromycin; and sulfa drugs cross reactors.  MEDICATIONS:  Current Outpatient Prescriptions  Medication Sig Dispense Refill  . aspirin 81 MG tablet Take 81 mg by mouth daily.    Marland Kitchen atenolol (TENORMIN) 50 MG tablet Take 50 mg by mouth 2 (two) times daily.     . benzonatate (TESSALON) 100 MG  capsule Take 100 mg by mouth 3 (three) times daily as needed for cough.    . calcium carbonate (OS-CAL - DOSED IN MG OF ELEMENTAL CALCIUM) 1250 MG tablet Take 1 tablet by mouth 2 (two) times daily with a meal.     . cyanocobalamin (,VITAMIN B-12,) 1000 MCG/ML injection Inject 1,000 mcg into the muscle every 30 (thirty) days.    . fluticasone furoate-vilanterol (BREO ELLIPTA) 100-25 MCG/INH AEPB Inhale 1 puff into the lungs daily.    . hydrochlorothiazide (MICROZIDE) 12.5 MG capsule Take 12.5 mg by mouth daily.    . hydroxyurea (HYDREA) 500 MG capsule Take 500 mg by mouth every other day. May take with food to minimize GI side effects. Reports alternating one day one day then two tablets the next.    . Hypromellose (ARTIFICIAL TEARS OP) Place 1 drop into both eyes as needed (for dry eyes).    . meclizine (ANTIVERT) 25 MG tablet Take 25 mg by mouth 3 (three) times daily as needed for dizziness.    . sertraline (ZOLOFT) 50 MG tablet Take 50 mg by mouth daily.    Marland Kitchen albuterol (PROVENTIL HFA;VENTOLIN HFA) 108 (90 Base) MCG/ACT inhaler Inhale 2 puffs into the lungs every 6 (six) hours as needed for wheezing or shortness of breath.    . traMADol (ULTRAM) 50 MG tablet Take 50 mg by mouth every 8 (eight) hours as needed for moderate pain.   1   No current facility-administered medications for this visit.  SURGICAL HISTORY:  Past Surgical History:  Procedure Laterality Date  . APPENDECTOMY    . CATARACT EXTRACTION, BILATERAL Bilateral   . STAPEDECTOMY Right   . TONSILLECTOMY    . VIDEO BRONCHOSCOPY WITH ENDOBRONCHIAL NAVIGATION N/A 02/11/2016   Procedure: VIDEO BRONCHOSCOPY WITH ENDOBRONCHIAL NAVIGATION;  Surgeon: Grace Isaac, MD;  Location: [facility];  Service: Thoracic;  Laterality: N/A;  . VIDEO BRONCHOSCOPY WITH ENDOBRONCHIAL ULTRASOUND N/A 02/11/2016   Procedure: VIDEO BRONCHOSCOPY WITH ENDOBRONCHIAL ULTRASOUND with biopsy of nodes #7 and #10;  Surgeon: Grace Isaac, MD;  Location: [facility];   Service: Thoracic;  Laterality: N/A;    REVIEW OF SYSTEMS:  A comprehensive review of systems was negative except for: Constitutional: positive for fatigue   PHYSICAL EXAMINATION: General appearance: alert, cooperative, fatigued and no distress Head: Normocephalic, without obvious abnormality, atraumatic Neck: no adenopathy, no JVD, supple, symmetrical, trachea midline and thyroid not enlarged, symmetric, no tenderness/mass/nodules Lymph nodes: Cervical, supraclavicular, and axillary nodes normal. Resp: clear to auscultation bilaterally Back: symmetric, no curvature. ROM normal. No CVA tenderness. Cardio: regular rate and rhythm, S1, S2 normal, no murmur, click, rub or gallop GI: soft, non-tender; bowel sounds normal; no masses,  no organomegaly Extremities: extremities normal, atraumatic, no cyanosis or edema  ECOG PERFORMANCE STATUS: 1 - Symptomatic but completely ambulatory  Blood pressure 138/65, pulse 82, temperature 98 F (36.7 C), temperature source Oral, resp. rate 18, height 5' 4.5" (1.638 m), weight 111 lb 6.4 oz (50.5 kg), SpO2 95 %.  LABORATORY DATA: Lab Results  Component Value Date   WBC 6.3 11/05/2016   HGB 13.0 11/05/2016   HCT 39.2 11/05/2016   MCV 110.7 (H) 11/05/2016   PLT 362 11/05/2016      Chemistry      Component Value Date/Time   NA 137 11/05/2016 1031   K 4.4 11/05/2016 1031   CL 102 02/11/2016 1156   CO2 28 11/05/2016 1031   BUN 19.1 11/05/2016 1031   CREATININE 0.7 11/05/2016 1031      Component Value Date/Time   CALCIUM 9.4 11/05/2016 1031   ALKPHOS 58 11/05/2016 1031   AST 20 11/05/2016 1031   ALT 9 11/05/2016 1031   BILITOT 0.45 11/05/2016 1031       RADIOGRAPHIC STUDIES: Ct Chest W Contrast  Result Date: 11/05/2016 CLINICAL DATA:  The lung cancer EXAM: CT CHEST WITH CONTRAST TECHNIQUE: Multidetector CT imaging of the chest was performed during intravenous contrast administration. COMPARISON:  07/31/2016 FINDINGS: Cardiovascular:  Heart size upper normal. No pericardial effusion. Coronary artery calcification is evident. Atherosclerotic calcification is noted in the wall of the thoracic aorta. Mediastinum/Nodes: Scattered lymph nodes identified in the mediastinum in each hilum without lymphadenopathy. Mild circumferential wall thickening distal esophagus similar to prior. There is no axillary lymphadenopathy. Lungs/Pleura: Stable appearance biapical pleural-parenchymal scarring. Moderately advanced centrilobular emphysema noted with areas of panlobular involvement. Left lower lobe lesion measures 10 x 13 mm today, stable. Reticular opacity surrounding the lesion likely secondary to the radiation treatment. 4 mm right upper lobe pulmonary nodule measured on the prior study has resolved in the interval. No pulmonary edema or pleural effusion. Upper Abdomen: Layering calcified tiny gallstones noted. 7 mm hypoattenuating lesion upper pole right kidney is compatible with a cyst no adrenal nodule or mass. Musculoskeletal: Bone windows reveal no worrisome lytic or sclerotic osseous lesions. IMPRESSION: 1. Stable left lower lobe spiculated nodule with interval development of surrounding reticular change, likely radiation related. 2. 4 mm right lower lobe nodule seen on  the prior study has resolved in the interval. 3.  Emphysema. (ICD10-J43.9) 4. Coronary artery and Aortic Atherosclerois (ICD10-170.0) Electronically Signed   By: Misty Stanley M.D.   On: 11/05/2016 13:24    ASSESSMENT AND PLAN:  This is a very pleasant 83 years old white female with suspicious primary bronchogenic carcinoma involving the left lower lobe with a pulmonary nodule status post stereotactic radiotherapy under the care of Dr. Tammi Klippel. The patient is currently on observation. Her recent CT scan of the chest showed no evidence for disease progression. I discussed the scan results with the patient recommended for her to continue on observation with repeat CT scan of the  chest in one year. For the history of essential thrombocythemia, she will continue on hydroxyurea as prescribed by Dr. Inda Merlin. The patient was advised to call immediately if she has any concerning symptoms in the interval. The patient voices understanding of current disease status and treatment options and is in agreement with the current care plan. All questions were answered. The patient knows to call the clinic with any problems, questions or concerns. We can certainly see the patient much sooner if necessary. I spent 10 minutes counseling the patient face to face. The total time spent in the appointment was 15 minutes.  Disclaimer: This note was dictated with voice recognition software. Similar sounding words can inadvertently be transcribed and may not be corrected upon review.

## 2016-11-20 ENCOUNTER — Telehealth: Payer: Self-pay | Admitting: Internal Medicine

## 2016-11-20 NOTE — Telephone Encounter (Signed)
Scheduled patient per 10/23 los. Spoke to patient regarding upcoming 10/2017 appointments.

## 2016-11-27 DIAGNOSIS — C44729 Squamous cell carcinoma of skin of left lower limb, including hip: Secondary | ICD-10-CM | POA: Diagnosis not present

## 2016-11-27 DIAGNOSIS — L905 Scar conditions and fibrosis of skin: Secondary | ICD-10-CM | POA: Diagnosis not present

## 2016-12-08 DIAGNOSIS — H6121 Impacted cerumen, right ear: Secondary | ICD-10-CM | POA: Diagnosis not present

## 2016-12-10 DIAGNOSIS — H01001 Unspecified blepharitis right upper eyelid: Secondary | ICD-10-CM | POA: Diagnosis not present

## 2016-12-10 DIAGNOSIS — H01002 Unspecified blepharitis right lower eyelid: Secondary | ICD-10-CM | POA: Diagnosis not present

## 2016-12-10 DIAGNOSIS — H353132 Nonexudative age-related macular degeneration, bilateral, intermediate dry stage: Secondary | ICD-10-CM | POA: Diagnosis not present

## 2016-12-10 DIAGNOSIS — H01004 Unspecified blepharitis left upper eyelid: Secondary | ICD-10-CM | POA: Diagnosis not present

## 2016-12-11 DIAGNOSIS — S81802A Unspecified open wound, left lower leg, initial encounter: Secondary | ICD-10-CM | POA: Diagnosis not present

## 2016-12-23 DIAGNOSIS — Z4801 Encounter for change or removal of surgical wound dressing: Secondary | ICD-10-CM | POA: Diagnosis not present

## 2016-12-23 DIAGNOSIS — S81802D Unspecified open wound, left lower leg, subsequent encounter: Secondary | ICD-10-CM | POA: Diagnosis not present

## 2017-01-01 DIAGNOSIS — S81802A Unspecified open wound, left lower leg, initial encounter: Secondary | ICD-10-CM | POA: Diagnosis not present

## 2017-01-29 DIAGNOSIS — S81802D Unspecified open wound, left lower leg, subsequent encounter: Secondary | ICD-10-CM | POA: Diagnosis not present

## 2017-02-23 DIAGNOSIS — J441 Chronic obstructive pulmonary disease with (acute) exacerbation: Secondary | ICD-10-CM | POA: Diagnosis not present

## 2017-02-24 ENCOUNTER — Other Ambulatory Visit: Payer: Self-pay | Admitting: Family Medicine

## 2017-02-24 ENCOUNTER — Ambulatory Visit
Admission: RE | Admit: 2017-02-24 | Discharge: 2017-02-24 | Disposition: A | Discharge status: Home / Self Care | Payer: Medicare Other | Source: Ambulatory Visit | Attending: Family Medicine | Admitting: Family Medicine

## 2017-02-24 DIAGNOSIS — R911 Solitary pulmonary nodule: Secondary | ICD-10-CM

## 2017-02-24 DIAGNOSIS — R05 Cough: Secondary | ICD-10-CM | POA: Diagnosis not present

## 2017-02-26 DIAGNOSIS — J189 Pneumonia, unspecified organism: Secondary | ICD-10-CM | POA: Diagnosis not present

## 2017-03-18 DIAGNOSIS — J189 Pneumonia, unspecified organism: Secondary | ICD-10-CM | POA: Diagnosis not present

## 2017-03-19 ENCOUNTER — Ambulatory Visit
Admission: RE | Admit: 2017-03-19 | Discharge: 2017-03-19 | Disposition: A | Discharge status: Home / Self Care | Payer: Medicare Other | Source: Ambulatory Visit | Attending: Family Medicine | Admitting: Family Medicine

## 2017-03-19 ENCOUNTER — Other Ambulatory Visit: Payer: Self-pay | Admitting: Family Medicine

## 2017-03-19 DIAGNOSIS — Z09 Encounter for follow-up examination after completed treatment for conditions other than malignant neoplasm: Secondary | ICD-10-CM

## 2017-03-19 DIAGNOSIS — J181 Lobar pneumonia, unspecified organism: Secondary | ICD-10-CM | POA: Diagnosis not present

## 2017-04-16 DIAGNOSIS — J441 Chronic obstructive pulmonary disease with (acute) exacerbation: Secondary | ICD-10-CM | POA: Diagnosis not present

## 2017-04-16 DIAGNOSIS — R062 Wheezing: Secondary | ICD-10-CM | POA: Diagnosis not present

## 2017-04-16 DIAGNOSIS — C3432 Malignant neoplasm of lower lobe, left bronchus or lung: Secondary | ICD-10-CM | POA: Diagnosis not present

## 2017-05-20 ENCOUNTER — Ambulatory Visit
Admission: RE | Admit: 2017-05-20 | Discharge: 2017-05-20 | Disposition: A | Discharge status: Home / Self Care | Payer: Medicare Other | Source: Ambulatory Visit | Attending: Family Medicine | Admitting: Family Medicine

## 2017-05-20 ENCOUNTER — Other Ambulatory Visit: Payer: Self-pay | Admitting: Family Medicine

## 2017-05-20 DIAGNOSIS — C3432 Malignant neoplasm of lower lobe, left bronchus or lung: Secondary | ICD-10-CM

## 2017-05-20 DIAGNOSIS — D473 Essential (hemorrhagic) thrombocythemia: Secondary | ICD-10-CM | POA: Diagnosis not present

## 2017-05-20 DIAGNOSIS — I10 Essential (primary) hypertension: Secondary | ICD-10-CM | POA: Diagnosis not present

## 2017-05-20 DIAGNOSIS — J9610 Chronic respiratory failure, unspecified whether with hypoxia or hypercapnia: Secondary | ICD-10-CM | POA: Diagnosis not present

## 2017-05-20 DIAGNOSIS — J449 Chronic obstructive pulmonary disease, unspecified: Secondary | ICD-10-CM | POA: Diagnosis not present

## 2017-05-20 DIAGNOSIS — J439 Emphysema, unspecified: Secondary | ICD-10-CM | POA: Diagnosis not present

## 2017-06-09 DIAGNOSIS — H01004 Unspecified blepharitis left upper eyelid: Secondary | ICD-10-CM | POA: Diagnosis not present

## 2017-06-09 DIAGNOSIS — H353132 Nonexudative age-related macular degeneration, bilateral, intermediate dry stage: Secondary | ICD-10-CM | POA: Diagnosis not present

## 2017-06-09 DIAGNOSIS — H01002 Unspecified blepharitis right lower eyelid: Secondary | ICD-10-CM | POA: Diagnosis not present

## 2017-06-09 DIAGNOSIS — H01001 Unspecified blepharitis right upper eyelid: Secondary | ICD-10-CM | POA: Diagnosis not present

## 2017-10-05 DIAGNOSIS — Z1231 Encounter for screening mammogram for malignant neoplasm of breast: Secondary | ICD-10-CM | POA: Diagnosis not present

## 2017-10-05 DIAGNOSIS — Z803 Family history of malignant neoplasm of breast: Secondary | ICD-10-CM | POA: Diagnosis not present

## 2017-10-27 DIAGNOSIS — C44722 Squamous cell carcinoma of skin of right lower limb, including hip: Secondary | ICD-10-CM | POA: Diagnosis not present

## 2017-10-27 DIAGNOSIS — D485 Neoplasm of uncertain behavior of skin: Secondary | ICD-10-CM | POA: Diagnosis not present

## 2017-10-27 DIAGNOSIS — D1801 Hemangioma of skin and subcutaneous tissue: Secondary | ICD-10-CM | POA: Diagnosis not present

## 2017-10-27 DIAGNOSIS — L570 Actinic keratosis: Secondary | ICD-10-CM | POA: Diagnosis not present

## 2017-10-27 DIAGNOSIS — Z85828 Personal history of other malignant neoplasm of skin: Secondary | ICD-10-CM | POA: Diagnosis not present

## 2017-10-27 DIAGNOSIS — B079 Viral wart, unspecified: Secondary | ICD-10-CM | POA: Diagnosis not present

## 2017-11-06 DIAGNOSIS — Z23 Encounter for immunization: Secondary | ICD-10-CM | POA: Diagnosis not present

## 2017-11-10 DIAGNOSIS — C44722 Squamous cell carcinoma of skin of right lower limb, including hip: Secondary | ICD-10-CM | POA: Diagnosis not present

## 2017-11-16 ENCOUNTER — Ambulatory Visit (HOSPITAL_COMMUNITY)
Admission: RE | Admit: 2017-11-16 | Discharge: 2017-11-16 | Disposition: A | Discharge status: Home / Self Care | Payer: Medicare Other | Source: Ambulatory Visit | Attending: Internal Medicine | Admitting: Internal Medicine

## 2017-11-16 ENCOUNTER — Inpatient Hospital Stay: Payer: Medicare Other | Attending: Internal Medicine

## 2017-11-16 DIAGNOSIS — C3432 Malignant neoplasm of lower lobe, left bronchus or lung: Secondary | ICD-10-CM | POA: Diagnosis not present

## 2017-11-16 DIAGNOSIS — C3490 Malignant neoplasm of unspecified part of unspecified bronchus or lung: Secondary | ICD-10-CM

## 2017-11-16 DIAGNOSIS — J439 Emphysema, unspecified: Secondary | ICD-10-CM | POA: Diagnosis not present

## 2017-11-16 DIAGNOSIS — D473 Essential (hemorrhagic) thrombocythemia: Secondary | ICD-10-CM | POA: Diagnosis not present

## 2017-11-16 DIAGNOSIS — I700 Atherosclerosis of aorta: Secondary | ICD-10-CM | POA: Diagnosis not present

## 2017-11-16 DIAGNOSIS — Z79899 Other long term (current) drug therapy: Secondary | ICD-10-CM | POA: Insufficient documentation

## 2017-11-16 LAB — COMPREHENSIVE METABOLIC PANEL
ALT: 14 U/L (ref 0–44)
AST: 22 U/L (ref 15–41)
Albumin: 3.6 g/dL (ref 3.5–5.0)
Alkaline Phosphatase: 52 U/L (ref 38–126)
Anion gap: 9 (ref 5–15)
BUN: 20 mg/dL (ref 8–23)
CALCIUM: 9.4 mg/dL (ref 8.9–10.3)
CO2: 28 mmol/L (ref 22–32)
CREATININE: 0.81 mg/dL (ref 0.44–1.00)
Chloride: 99 mmol/L (ref 98–111)
GFR calc non Af Amer: >60 mL/min (ref >60)
Glucose, Bld: 95 mg/dL (ref 70–99)
Potassium: 4.1 mmol/L (ref 3.5–5.1)
Sodium: 136 mmol/L (ref 135–145)
Total Bilirubin: 0.6 mg/dL (ref 0.3–1.2)
Total Protein: 6.7 g/dL (ref 6.5–8.1)

## 2017-11-16 LAB — CBC WITH DIFFERENTIAL/PLATELET
ABS IMMATURE GRANULOCYTES: 0.01 10*3/uL (ref 0.00–0.07)
BASOS PCT: 0 %
Basophils Absolute: 0 10*3/uL (ref 0.0–0.1)
Eosinophils Absolute: 0.2 10*3/uL (ref 0.0–0.5)
Eosinophils Relative: 3 %
HCT: 40.6 % (ref 36.0–46.0)
Hemoglobin: 13.3 g/dL (ref 12.0–15.0)
Immature Granulocytes: 0 %
Lymphocytes Relative: 15 %
Lymphs Abs: 0.9 10*3/uL (ref 0.7–4.0)
MCH: 36.7 pg — AB (ref 26.0–34.0)
MCHC: 32.8 g/dL (ref 30.0–36.0)
MCV: 112.2 fL — ABNORMAL HIGH (ref 80.0–100.0)
MONO ABS: 1.4 10*3/uL — AB (ref 0.1–1.0)
Monocytes Relative: 22 %
NEUTROS ABS: 3.7 10*3/uL (ref 1.7–7.7)
NEUTROS PCT: 60 %
Platelets: 385 10*3/uL (ref 150–400)
RBC: 3.62 MIL/uL — AB (ref 3.87–5.11)
RDW: 14.5 % (ref 11.5–15.5)
WBC: 6.1 10*3/uL (ref 4.0–10.5)
nRBC: 0 % (ref 0.0–0.2)

## 2017-11-23 ENCOUNTER — Encounter: Payer: Self-pay | Admitting: Internal Medicine

## 2017-11-23 ENCOUNTER — Telehealth: Payer: Self-pay | Admitting: Internal Medicine

## 2017-11-23 ENCOUNTER — Inpatient Hospital Stay (HOSPITAL_BASED_OUTPATIENT_CLINIC_OR_DEPARTMENT_OTHER): Payer: Medicare Other | Admitting: Internal Medicine

## 2017-11-23 VITALS — BP 124/68 | HR 57 | Temp 97.7°F | Resp 16 | Ht 64.0 in | Wt 113.2 lb

## 2017-11-23 DIAGNOSIS — D473 Essential (hemorrhagic) thrombocythemia: Secondary | ICD-10-CM | POA: Diagnosis not present

## 2017-11-23 DIAGNOSIS — Z79899 Other long term (current) drug therapy: Secondary | ICD-10-CM | POA: Diagnosis not present

## 2017-11-23 DIAGNOSIS — C3490 Malignant neoplasm of unspecified part of unspecified bronchus or lung: Secondary | ICD-10-CM

## 2017-11-23 NOTE — Progress Notes (Signed)
[facility] [phone]   Fax:(336) [phone]  OFFICE PROGRESS NOTE  [name], MD [place] [place] [address]  DIAGNOSIS:  1) Questionable lung cancer presented with left lower lobe and questionable mediastinal and hilar lymph nodes. 2) essential thrombocythemia.  PRIOR THERAPY: SBRT to the left lower lobe lung nodule under the care of Dr. Tammi Klippel completed on 04/16/2016.  CURRENT THERAPY: hydroxyurea 500 mg by mouth daily.  INTERVAL HISTORY: Kathy Pierce 84 y.o. female returns to the clinic today for annual follow-up visit.  The patient is feeling fine today with no concerning complaints except for shortness of breath with exertion.  She denied having any chest pain, cough or hemoptysis.  She denied having any weight loss or night sweats.  She has no nausea, vomiting, diarrhea or constipation.  She is currently on observation for lung cancer as well as hydroxyurea for essential thrombocythemia and she is tolerating it well.  The patient had repeat CT scan of the chest performed recently and she is here for evaluation and discussion of her risk her results.   MEDICAL HISTORY: Past Medical History:  Diagnosis Date  . Cancer (HCC)    Squamous cell carcinoma, skin, basal cell carcinoma  . Complication of anesthesia    wakes up slowly   . COPD (chronic obstructive pulmonary disease) ([facility])   . Depression   . Essential thrombocythemia ([facility])   . Hypertension   . Lung cancer ([facility]) dx'd 2017  . Osteoporosis   . Sciatica of left side 06/16/2012  . Spinal stenosis of lumbar region 06/16/2012  . Vitamin B12 deficiency     ALLERGIES:  is allergic to penicillins; codeine; erythromycin; and sulfa drugs cross reactors.  MEDICATIONS:  Current Outpatient Medications  Medication Sig Dispense Refill  . albuterol (PROVENTIL HFA;VENTOLIN HFA) 108 (90 Base) MCG/ACT inhaler Inhale 2 puffs into the lungs every 6 (six) hours as needed for  wheezing or shortness of breath.    Marland Kitchen aspirin 81 MG tablet Take 81 mg by mouth daily.    Marland Kitchen atenolol (TENORMIN) 50 MG tablet Take 50 mg by mouth 2 (two) times daily.     . benzonatate (TESSALON) 100 MG capsule Take 100 mg by mouth 3 (three) times daily as needed for cough.    . calcium carbonate (OS-CAL - DOSED IN MG OF ELEMENTAL CALCIUM) 1250 MG tablet Take 1 tablet by mouth 2 (two) times daily with a meal.     . cyanocobalamin (,VITAMIN B-12,) 1000 MCG/ML injection Inject 1,000 mcg into the muscle every 30 (thirty) days.    . fluticasone furoate-vilanterol (BREO ELLIPTA) 100-25 MCG/INH AEPB Inhale 1 puff into the lungs daily.    . hydrochlorothiazide (MICROZIDE) 12.5 MG capsule Take 12.5 mg by mouth daily.    . hydroxyurea (HYDREA) 500 MG capsule Take 500 mg by mouth every other day. May take with food to minimize GI side effects. Reports alternating one day one day then two tablets the next.    . Hypromellose (ARTIFICIAL TEARS OP) Place 1 drop into both eyes as needed (for dry eyes).    . meclizine (ANTIVERT) 25 MG tablet Take 25 mg by mouth 3 (three) times daily as needed for dizziness.    . sertraline (ZOLOFT) 50 MG tablet Take 50 mg by mouth daily.    . traMADol (ULTRAM) 50 MG tablet Take 50 mg by mouth every 8 (eight) hours as needed for moderate pain.   1  No current facility-administered medications for this visit.     SURGICAL HISTORY:  Past Surgical History:  Procedure Laterality Date  . APPENDECTOMY    . CATARACT EXTRACTION, BILATERAL Bilateral   . STAPEDECTOMY Right   . TONSILLECTOMY    . VIDEO BRONCHOSCOPY WITH ENDOBRONCHIAL NAVIGATION N/A 02/11/2016   Procedure: VIDEO BRONCHOSCOPY WITH ENDOBRONCHIAL NAVIGATION;  Surgeon: Grace Isaac, MD;  Location: [facility];  Service: Thoracic;  Laterality: N/A;  . VIDEO BRONCHOSCOPY WITH ENDOBRONCHIAL ULTRASOUND N/A 02/11/2016   Procedure: VIDEO BRONCHOSCOPY WITH ENDOBRONCHIAL ULTRASOUND with biopsy of nodes #7 and #10;  Surgeon: Grace Isaac, MD;  Location: [facility];  Service: Thoracic;  Laterality: N/A;    REVIEW OF SYSTEMS:  A comprehensive review of systems was negative except for: Respiratory: positive for dyspnea on exertion   PHYSICAL EXAMINATION: General appearance: alert, cooperative and no distress Head: Normocephalic, without obvious abnormality, atraumatic Neck: no adenopathy, no JVD, supple, symmetrical, trachea midline and thyroid not enlarged, symmetric, no tenderness/mass/nodules Lymph nodes: Cervical, supraclavicular, and axillary nodes normal. Resp: clear to auscultation bilaterally Back: symmetric, no curvature. ROM normal. No CVA tenderness. Cardio: regular rate and rhythm, S1, S2 normal, no murmur, click, rub or gallop GI: soft, non-tender; bowel sounds normal; no masses,  no organomegaly Extremities: extremities normal, atraumatic, no cyanosis or edema  ECOG PERFORMANCE STATUS: 1 - Symptomatic but completely ambulatory  Blood pressure 124/68, pulse (!) 57, temperature 97.7 F (36.5 C), temperature source Oral, resp. rate 16, height 5\' 4"  (1.626 m), weight 113 lb 3.2 oz (51.3 kg), SpO2 98 %.  LABORATORY DATA: Lab Results  Component Value Date   WBC 6.1 11/16/2017   HGB 13.3 11/16/2017   HCT 40.6 11/16/2017   MCV 112.2 (H) 11/16/2017   PLT 385 11/16/2017      Chemistry      Component Value Date/Time   NA 136 11/16/2017 0944   NA 137 11/05/2016 1031   K 4.1 11/16/2017 0944   K 4.4 11/05/2016 1031   CL 99 11/16/2017 0944   CO2 28 11/16/2017 0944   CO2 28 11/05/2016 1031   BUN 20 11/16/2017 0944   BUN 19.1 11/05/2016 1031   CREATININE 0.81 11/16/2017 0944   CREATININE 0.7 11/05/2016 1031      Component Value Date/Time   CALCIUM 9.4 11/16/2017 0944   CALCIUM 9.4 11/05/2016 1031   ALKPHOS 52 11/16/2017 0944   ALKPHOS 58 11/05/2016 1031   AST 22 11/16/2017 0944   AST 20 11/05/2016 1031   ALT 14 11/16/2017 0944   ALT 9 11/05/2016 1031   BILITOT 0.6 11/16/2017 0944   BILITOT 0.45  11/05/2016 1031       RADIOGRAPHIC STUDIES: Ct Chest Wo Contrast  Result Date: 11/16/2017 CLINICAL DATA:  Left lower lobe lung cancer, XRT complete EXAM: CT CHEST WITHOUT CONTRAST TECHNIQUE: Multidetector CT imaging of the chest was performed following the standard protocol without IV contrast. COMPARISON:  05/20/2017 FINDINGS: Cardiovascular: The heart is normal in size. No pericardial effusion. Ectasia of the ascending thoracic aorta, measuring up to 3.5 cm. Atherosclerotic calcifications of the aortic arch. Three vessel coronary atherosclerosis. Mediastinum/Nodes: Small mediastinal lymph nodes, partially calcified, including a 9 mm short axis low right paratracheal node and a 10 mm short axis subcarinal node, grossly unchanged. Visualized thyroid is unremarkable. Lungs/Pleura: 8 x 10 mm superior segment left lower lobe nodule (series 7/image 63), previously 11 x 13 mm. Surrounding radiation changes and adjacent fiducial markers. Moderate centrilobular and paraseptal emphysematous changes, upper  lobe predominant. Biapical pleural-parenchymal scarring, right greater than left. No focal consolidation. No pleural effusion or pneumothorax. Upper Abdomen: Visualized upper abdomen is notable for layering gallstones and vascular calcifications. Musculoskeletal: Degenerative changes of the visualized thoracolumbar spine. IMPRESSION: 8 x 10 mm left lower lobe nodule, mildly decreased, with surrounding radiation changes. No evidence of metastatic disease. Stable small mediastinal nodes, some of which are partially calcified, likely reactive. Aortic Atherosclerosis (ICD10-I70.0) and Emphysema (ICD10-J43.9). Electronically Signed   By: Julian Hy M.D.   On: 11/16/2017 13:40    ASSESSMENT AND PLAN:  This is a very pleasant 84 years old white female with suspicious primary bronchogenic carcinoma involving the left lower lobe with a pulmonary nodule status post stereotactic radiotherapy under the care of Dr.  Tammi Klippel. The patient has been in observation since that time. She had repeat CT scan of the chest performed recently.  I personally and independently reviewed the scan images and discussed the results with the patient today. Her scan showed no concerning findings for disease progression. I recommended for the patient to continue on observation with repeat CT scan of the chest in 1 year. For the essential thrombocythemia, the patient will continue her current treatment with hydroxyurea. She was advised to call immediately if she has any concerning symptoms in the interval. The patient voices understanding of current disease status and treatment options and is in agreement with the current care plan. All questions were answered. The patient knows to call the clinic with any problems, questions or concerns. We can certainly see the patient much sooner if necessary. I spent 10 minutes counseling the patient face to face. The total time spent in the appointment was 15 minutes.  Disclaimer: This note was dictated with voice recognition software. Similar sounding words can inadvertently be transcribed and may not be corrected upon review.

## 2017-11-23 NOTE — Telephone Encounter (Signed)
Gave pt avs and calendar  °

## 2017-11-30 DIAGNOSIS — B078 Other viral warts: Secondary | ICD-10-CM | POA: Diagnosis not present

## 2017-11-30 DIAGNOSIS — Z23 Encounter for immunization: Secondary | ICD-10-CM | POA: Diagnosis not present

## 2017-11-30 DIAGNOSIS — L570 Actinic keratosis: Secondary | ICD-10-CM | POA: Diagnosis not present

## 2017-12-02 ENCOUNTER — Other Ambulatory Visit: Payer: Self-pay | Admitting: Family Medicine

## 2017-12-02 DIAGNOSIS — Z0000 Encounter for general adult medical examination without abnormal findings: Secondary | ICD-10-CM | POA: Diagnosis not present

## 2017-12-02 DIAGNOSIS — M810 Age-related osteoporosis without current pathological fracture: Secondary | ICD-10-CM

## 2017-12-02 DIAGNOSIS — I10 Essential (primary) hypertension: Secondary | ICD-10-CM | POA: Diagnosis not present

## 2017-12-02 DIAGNOSIS — J449 Chronic obstructive pulmonary disease, unspecified: Secondary | ICD-10-CM | POA: Diagnosis not present

## 2017-12-02 DIAGNOSIS — D473 Essential (hemorrhagic) thrombocythemia: Secondary | ICD-10-CM | POA: Diagnosis not present

## 2017-12-08 DIAGNOSIS — H53452 Other localized visual field defect, left eye: Secondary | ICD-10-CM | POA: Diagnosis not present

## 2017-12-08 DIAGNOSIS — Z961 Presence of intraocular lens: Secondary | ICD-10-CM | POA: Diagnosis not present

## 2017-12-08 DIAGNOSIS — H35371 Puckering of macula, right eye: Secondary | ICD-10-CM | POA: Diagnosis not present

## 2017-12-08 DIAGNOSIS — H353132 Nonexudative age-related macular degeneration, bilateral, intermediate dry stage: Secondary | ICD-10-CM | POA: Diagnosis not present

## 2017-12-14 ENCOUNTER — Encounter: Payer: Self-pay | Admitting: Pulmonary Disease

## 2017-12-14 ENCOUNTER — Ambulatory Visit: Payer: Medicare Other | Admitting: Pulmonary Disease

## 2017-12-14 VITALS — BP 106/60 | HR 71 | Ht 64.0 in | Wt 112.8 lb

## 2017-12-14 DIAGNOSIS — J449 Chronic obstructive pulmonary disease, unspecified: Secondary | ICD-10-CM

## 2017-12-14 MED ORDER — FLUTICASONE FUROATE-VILANTEROL 100-25 MCG/INH IN AEPB: 1.0000 | INHALATION_SPRAY | Freq: Every day | RESPIRATORY_TRACT | 0 refills | Status: DC

## 2017-12-14 NOTE — Progress Notes (Signed)
Kathy Pierce    [id number]    1925/10/31  Primary Care Physician:Gates, Butch Penny, MD  Referring Physician: [name], MD [facility] [address], [place] [address]  Chief complaint: Consult for COPD  HPI: 84 year old with past medical history of lung cancer, COPD, essential thrombocythemia Followed by Dr. Julien Nordmann for suspicious left lower lobe nodule suspected to be bronchogenic carcinoma.  Bronchoscopy of the pelvis in January 2018 was nondiagnostic.  She underwent stereotactic radiation [completed March 2018] for this and has been under observation since then.  Maintained on Breo 100 for the past few years.  She feels this is helping with her breathing however the cost of the inhaler is sometimes excessive.  Has chronic dyspnea on exertion, mild symptoms at rest.  Chronic cough with white mucus production Denies any wheezing, fevers, chills.  Pets: No pets Occupation: Used to work as a Network engineer for a night agency Exposures: No known exposures, no mold, hot tub, Jacuzzi Smoking history: 21-pack-year smoker.  Quit in 1978 Travel history: Lived in [place] for few years.  No other significant travel Relevant family history: No significant family history of lung disease   Outpatient Encounter Medications as of 12/14/2017  Medication Sig  . albuterol (PROVENTIL HFA;VENTOLIN HFA) 108 (90 Base) MCG/ACT inhaler Inhale 2 puffs into the lungs every 6 (six) hours as needed for wheezing or shortness of breath.  Marland Kitchen aspirin 81 MG tablet Take 81 mg by mouth daily.  Marland Kitchen atenolol (TENORMIN) 50 MG tablet Take 50 mg by mouth 2 (two) times daily.   . benzonatate (TESSALON) 100 MG capsule Take 100 mg by mouth 3 (three) times daily as needed for cough.  . calcium carbonate (OS-CAL - DOSED IN MG OF ELEMENTAL CALCIUM) 1250 MG tablet Take 1 tablet by mouth 2 (two) times daily with a meal.   . cyanocobalamin (,VITAMIN B-12,) 1000 MCG/ML injection Inject 1,000 mcg into the muscle  every 30 (thirty) days.  . fluticasone furoate-vilanterol (BREO ELLIPTA) 100-25 MCG/INH AEPB Inhale 1 puff into the lungs daily.  . hydrochlorothiazide (MICROZIDE) 12.5 MG capsule Take 12.5 mg by mouth daily.  . hydroxyurea (HYDREA) 500 MG capsule Take 500 mg by mouth every other day. May take with food to minimize GI side effects. Reports alternating one day one day then two tablets the next.  . Hypromellose (ARTIFICIAL TEARS OP) Place 1 drop into both eyes as needed (for dry eyes).  . meclizine (ANTIVERT) 25 MG tablet Take 25 mg by mouth 3 (three) times daily as needed for dizziness.  . sertraline (ZOLOFT) 50 MG tablet Take 50 mg by mouth daily.  . traMADol (ULTRAM) 50 MG tablet Take 50 mg by mouth every 8 (eight) hours as needed for moderate pain.    No facility-administered encounter medications on file as of 12/14/2017.     Allergies as of 12/14/2017 - Review Complete 12/14/2017  Allergen Reaction Noted  . Penicillins Hives and Rash 07/11/2011  . Codeine Nausea And Vomiting 07/11/2011  . Erythromycin Nausea And Vomiting 11/24/2011  . Sulfa drugs cross reactors Nausea And Vomiting 07/11/2011    Past Medical History:  Diagnosis Date  . Cancer (HCC)    Squamous cell carcinoma, skin, basal cell carcinoma  . Complication of anesthesia    wakes up slowly   . COPD (chronic obstructive pulmonary disease) ([place])   . Depression   . Essential thrombocythemia ([place])   . Hypertension   . Lung cancer ([place]) dx'd 2017  .  Osteoporosis   . Sciatica of left side 06/16/2012  . Spinal stenosis of lumbar region 06/16/2012  . Vitamin B12 deficiency     Past Surgical History:  Procedure Laterality Date  . APPENDECTOMY    . CATARACT EXTRACTION, BILATERAL Bilateral   . STAPEDECTOMY Right   . TONSILLECTOMY    . VIDEO BRONCHOSCOPY WITH ENDOBRONCHIAL NAVIGATION N/A 02/11/2016   Procedure: VIDEO BRONCHOSCOPY WITH ENDOBRONCHIAL NAVIGATION;  Surgeon: Grace Isaac, MD;  Location: [facility];  Service:  Thoracic;  Laterality: N/A;  . VIDEO BRONCHOSCOPY WITH ENDOBRONCHIAL ULTRASOUND N/A 02/11/2016   Procedure: VIDEO BRONCHOSCOPY WITH ENDOBRONCHIAL ULTRASOUND with biopsy of nodes #7 and #10;  Surgeon: Grace Isaac, MD;  Location: [facility];  Service: Thoracic;  Laterality: N/A;    Family History  Problem Relation Age of Onset  . Heart attack Father   . Cancer Mother   . Cancer Sister   . Cancer Brother   . Cancer Brother   . Cancer Daughter     Social History   Socioeconomic History  . Marital status: Widowed    Spouse name: Not on file  . Number of children: Not on file  . Years of education: Not on file  . Highest education level: Not on file  Occupational History  . Not on file  Social Needs  . Financial resource strain: Not on file  . Food insecurity:    Worry: Not on file    Inability: Not on file  . Transportation needs:    Medical: Not on file    Non-medical: Not on file  Tobacco Use  . Smoking status: Former Smoker    Packs/day: 1.00    Years: 21.00    Pack years: 21.00    Types: Cigarettes    Last attempt to quit: 07/10/1976    Years since quitting: 41.4  . Smokeless tobacco: Never Used  Substance and Sexual Activity  . Alcohol use: No  . Drug use: No  . Sexual activity: Never  Lifestyle  . Physical activity:    Days per week: Not on file    Minutes per session: Not on file  . Stress: Not on file  Relationships  . Social connections:    Talks on phone: Not on file    Gets together: Not on file    Attends religious service: Not on file    Active member of club or organization: Not on file    Attends meetings of clubs or organizations: Not on file    Relationship status: Not on file  . Intimate partner violence:    Fear of current or ex partner: Not on file    Emotionally abused: Not on file    Physically abused: Not on file    Forced sexual activity: Not on file  Other Topics Concern  . Not on file  Social History Narrative  . Not on file     Review of systems: Review of Systems  Constitutional: Negative for fever and chills.  HENT: Negative.   Eyes: Negative for blurred vision.  Respiratory: as per HPI  Cardiovascular: Negative for chest pain and palpitations.  Gastrointestinal: Negative for vomiting, diarrhea, blood per rectum. Genitourinary: Negative for dysuria, urgency, frequency and hematuria.  Musculoskeletal: Negative for myalgias, back pain and joint pain.  Skin: Negative for itching and rash.  Neurological: Negative for dizziness, tremors, focal weakness, seizures and loss of consciousness.  Endo/Heme/Allergies: Negative for environmental allergies.  Psychiatric/Behavioral: Negative for depression, suicidal ideas and hallucinations.  All other systems reviewed and are negative.  Physical Exam: Blood pressure 106/60, pulse 71, height 5\' 4"  (1.626 m), weight 112 lb 12.8 oz (51.2 kg), SpO2 98 %. Gen:      No acute distress HEENT:  EOMI, sclera anicteric Neck:     No masses; no thyromegaly Lungs:    Clear to auscultation bilaterally; normal respiratory effort CV:         Regular rate and rhythm; no murmurs Abd:      + bowel sounds; soft, non-tender; no palpable masses, no distension Ext:    No edema; adequate peripheral perfusion Skin:      Warm and dry; no rash Neuro: alert and oriented x 3 Psych: normal mood and affect  Data Reviewed: Imaging: PET scan 97/53/0051- hypermetabolic left lower lobe pulmonary nodule with AP window lymph node, precarinal and hilar lymphadenopathy. CT chest 11/05/2016- left lower lobe spiculated nodule with mild reticular changes in the periphery. CT chest 11/16/2017- 18 to 10 mm left lower lobe nodule mildly decreased with surrounding radiation fibrosis.  No evidence of metastatic disease.  PFTs: 05/24/2013-  FVC 1.72 [72%), FEV1 0.97 [45%), F/F 56, TLC 89%, DLCO 23% Moderate-severe obstruction with severe diffusion defect.  Labs: CBC 11/16/2017-WBC 6.1, eos 3%, absolute  eosinophil count 183  Pathology Lung biopsy 02/11/2016-benign lung, with chronic inflammation, anthracosis. Lymph node biopsy of 7, 10 R-negative for malignancy.  Assessment:  Severe COPD Symptoms appears stable on Breo We will get pulmonary function test for assessment since the last test was in 2015 If there is no significant bronchodilator response then we may switch her to LABA/LAMA combination.  Her peripheral eosinophils are low and she likely does not require inhaled steroids.  Left lower lobe lung cancer Status post XRT.  Stable on follow-up CT  Health maintenance 11/02/2017-influenza 09/29/2013-Prevnar 11/20/2001-Pneumovax  Plan/Recommendations: - Continue Breo - PFTs  Marshell Garfinkel MD [facility] 12/14/2017, 3:10 PM  CC: [name], MD

## 2017-12-14 NOTE — Patient Instructions (Signed)
Continue the [facility] for now We will schedule pulmonary function test for evaluation of the lung  Follow-up at next available opening after completion of lung test.

## 2017-12-17 ENCOUNTER — Ambulatory Visit: Payer: Medicare Other

## 2017-12-17 DIAGNOSIS — J449 Chronic obstructive pulmonary disease, unspecified: Secondary | ICD-10-CM

## 2017-12-17 LAB — PULMONARY FUNCTION TEST
DL/VA % pred: 52 %
DL/VA: 2.54 ml/min/mmHg/L
DLCO unc % pred: 34 %
DLCO unc: 8.26 ml/min/mmHg
FEF 25-75 Post: 0.54 L/sec
FEF 25-75 Pre: 0.52 L/sec
FEF2575-%CHANGE-POST: 4 %
FEF2575-%PRED-POST: 58 %
FEF2575-%Pred-Pre: 55 %
FEV1-%Change-Post: 2 %
FEV1-%PRED-PRE: 72 %
FEV1-%Pred-Post: 74 %
FEV1-POST: 1.2 L
FEV1-PRE: 1.17 L
FEV1FVC-%Change-Post: 4 %
FEV1FVC-%PRED-PRE: 80 %
FEV6-%CHANGE-POST: 0 %
FEV6-%PRED-POST: 95 %
FEV6-%Pred-Pre: 96 %
FEV6-Post: 1.97 L
FEV6-Pre: 1.97 L
FEV6FVC-%Change-Post: 1 %
FEV6FVC-%PRED-PRE: 103 %
FEV6FVC-%Pred-Post: 105 %
FVC-%Change-Post: -1 %
FVC-%Pred-Post: 91 %
FVC-%Pred-Pre: 92 %
FVC-Post: 2 L
FVC-Pre: 2.03 L
POST FEV1/FVC RATIO: 60 %
POST FEV6/FVC RATIO: 98 %
Pre FEV1/FVC ratio: 58 %
Pre FEV6/FVC Ratio: 97 %
RV % pred: 103 %
RV: 2.65 L
TLC % PRED: 89 %
TLC: 4.54 L

## 2017-12-18 ENCOUNTER — Encounter: Payer: Self-pay | Admitting: Podiatry

## 2017-12-18 ENCOUNTER — Ambulatory Visit (INDEPENDENT_AMBULATORY_CARE_PROVIDER_SITE_OTHER): Payer: Medicare Other | Admitting: Podiatry

## 2017-12-18 VITALS — BP 125/62

## 2017-12-18 DIAGNOSIS — I10 Essential (primary) hypertension: Secondary | ICD-10-CM | POA: Insufficient documentation

## 2017-12-18 DIAGNOSIS — M79674 Pain in right toe(s): Secondary | ICD-10-CM | POA: Diagnosis not present

## 2017-12-18 DIAGNOSIS — M810 Age-related osteoporosis without current pathological fracture: Secondary | ICD-10-CM | POA: Insufficient documentation

## 2017-12-18 DIAGNOSIS — M2042 Other hammer toe(s) (acquired), left foot: Secondary | ICD-10-CM | POA: Diagnosis not present

## 2017-12-18 DIAGNOSIS — F329 Major depressive disorder, single episode, unspecified: Secondary | ICD-10-CM | POA: Insufficient documentation

## 2017-12-18 DIAGNOSIS — M2041 Other hammer toe(s) (acquired), right foot: Secondary | ICD-10-CM

## 2017-12-18 DIAGNOSIS — M79675 Pain in left toe(s): Secondary | ICD-10-CM | POA: Diagnosis not present

## 2017-12-18 DIAGNOSIS — E539 Vitamin B deficiency, unspecified: Secondary | ICD-10-CM | POA: Insufficient documentation

## 2017-12-18 DIAGNOSIS — J449 Chronic obstructive pulmonary disease, unspecified: Secondary | ICD-10-CM | POA: Insufficient documentation

## 2017-12-18 DIAGNOSIS — L84 Corns and callosities: Secondary | ICD-10-CM

## 2017-12-18 NOTE — Patient Instructions (Addendum)
Corns and Calluses Corns are small areas of thickened skin that occur on the top, sides, or tip of a toe. They contain a cone-shaped core with a point that can press on a nerve below. This causes pain. Calluses are areas of thickened skin that can occur anywhere on the body including hands, fingers, palms, soles of the feet, and heels.Calluses are usually larger than corns. What are the causes? Corns and calluses are caused by rubbing (friction) or pressure, such as from shoes that are too tight or do not fit properly. What increases the risk? Corns are more likely to develop in people who have toe deformities, such as hammer toes. Since calluses can occur with friction to any area of the skin, calluses are more likely to develop in people who:  Work with their hands.  Wear shoes that fit poorly, shoes that are too tight, or shoes that are high-heeled.  Have toes deformities.  What are the signs or symptoms? Symptoms of a corn or callus include:  A hard growth on the skin.  Pain or tenderness under the skin.  Redness and swelling.  Increased discomfort while wearing tight-fitting shoes.  How is this diagnosed? Corns and calluses may be diagnosed with a medical history and physical exam. How is this treated? Corns and calluses may be treated with:  Removing the cause of the friction or pressure. This may include: ? Changing your shoes. ? Wearing shoe inserts (orthotics) or other protective layers in your shoes, such as a corn pad. ? Wearing gloves.  Medicines to help soften skin in the hardened, thickened areas.  Reducing the size of the corn or callus by removing the dead layers of skin.  Antibiotic medicines to treat infection.  Surgery, if a toe deformity is the cause.  Follow these instructions at home:  Take medicines only as directed by your health care provider.  If you were prescribed an antibiotic, finish all of it even if you start to feel better.  Wear  shoes that fit well. Avoid wearing high-heeled shoes and shoes that are too tight or too loose.  Wear any padding, protective layers, gloves, or orthotics as directed by your health care provider.  Soak your hands or feet and then use a file or pumice stone to soften your corn or callus. Do this as directed by your health care provider.  Check your corn or callus every day for signs of infection. Watch for: ? Redness, swelling, or pain. ? Fluid, blood, or pus. Contact a health care provider if:  Your symptoms do not improve with treatment.  You have increased redness, swelling, or pain at the site of your corn or callus.  You have fluid, blood, or pus coming from your corn or callus.  You have new symptoms. This information is not intended to replace advice given to you by your health care provider. Make sure you discuss any questions you have with your health care provider. Document Released: 10/20/2003 Document Revised: 08/03/2015 Document Reviewed: 01/09/2014 Elsevier Interactive Patient Education  2018 [place] Toe Hammer toe is a change in the shape (a deformity) of your second, third, or fourth toe. The deformity causes the middle joint of your toe to stay bent. This causes pain, especially when you are wearing shoes. Hammer toe starts gradually. At first, the toe can be straightened. Gradually over time, the deformity becomes stiff and permanent. Early treatments to keep the toe straight may relieve pain. As the deformity becomes stiff  and permanent, surgery may be needed to straighten the toe. What are the causes? Hammer toe is caused by abnormal bending of the toe joint that is closest to your foot. It happens gradually over time. This pulls on the muscles and connections (tendons) of the toe joint, making them weak and stiff. It is often related to wearing shoes that are too short or narrow and do not let your toes straighten. What increases the risk? You may be at  greater risk for hammer toe if you:  Are female.  Are older.  Wear shoes that are too small.  Wear high-heeled shoes that pinch your toes.  Are a Engineer, mining.  Have a second toe that is longer than your big toe (first toe).  Injure your foot or toe.  Have arthritis.  Have a family history of hammer toe.  Have a nerve or muscle disorder.  What are the signs or symptoms? The main symptoms of this condition are pain and deformity of the toe. The pain is worse when wearing shoes, walking, or running. Other symptoms may include:  Corns or calluses over the bent part of the toe or between the toes.  Redness and a burning feeling on the toe.  An open sore that forms on the top of the toe.  Not being able to straighten the toe.  How is this diagnosed? This condition is diagnosed based on your symptoms and a physical exam. During the exam, your health care provider will try to straighten your toe to see how stiff the deformity is. You may also have tests, such as:  A blood test to check for rheumatoid arthritis.  An X-ray to show how severe the deformity is.  How is this treated? Treatment for this condition will depend on how stiff the deformity is. Surgery is often needed. However, sometimes a hammer toe can be straightened without surgery. Treatments that do not involve surgery include:  Taping the toe into a straightened position.  Using pads and cushions to protect the toe (orthotics).  Wearing shoes that provide enough room for the toes.  Doing toe-stretching exercises at home.  Taking an NSAID to reduce pain and swelling.  If these treatments do not help or the toe cannot be straightened, surgery is the next option. The most common surgeries used to straighten a hammer toe include:  Arthroplasty. In this procedure, part of the joint is removed, and that allows the toe to straighten.  Fusion. In this procedure, cartilage between the two bones of the joint is  taken out and the bones are fused together into one longer bone.  Implantation. In this procedure, part of the bone is removed and replaced with an implant to let the toe move again.  Flexor tendon transfer. In this procedure, the tendons that curl the toes down (flexor tendons) are repositioned.  Follow these instructions at home:  Take over-the-counter and prescription medicines only as told by your health care provider.  Do toe straightening and stretching exercises as told by your health care provider.  Keep all follow-up visits as told by your health care provider. This is important. How is this prevented?  Wear shoes that give your toes enough room and do not cause pain.  Do not wear high-heeled shoes. Contact a health care provider if:  Your pain gets worse.  Your toe becomes red or swollen.  You develop an open sore on your toe. This information is not intended to replace advice given to you  by your health care provider. Make sure you discuss any questions you have with your health care provider. Document Released: 01/11/2000 Document Revised: 08/03/2015 Document Reviewed: 05/09/2015 Elsevier Interactive Patient Education  [employer].

## 2017-12-23 ENCOUNTER — Ambulatory Visit
Admission: RE | Admit: 2017-12-23 | Discharge: 2017-12-23 | Disposition: A | Discharge status: Home / Self Care | Payer: Medicare Other | Source: Ambulatory Visit | Attending: Family Medicine | Admitting: Family Medicine

## 2017-12-23 DIAGNOSIS — M810 Age-related osteoporosis without current pathological fracture: Secondary | ICD-10-CM | POA: Diagnosis not present

## 2017-12-23 DIAGNOSIS — Z780 Asymptomatic menopausal state: Secondary | ICD-10-CM | POA: Diagnosis not present

## 2017-12-23 DIAGNOSIS — M8588 Other specified disorders of bone density and structure, other site: Secondary | ICD-10-CM | POA: Diagnosis not present

## 2017-12-31 ENCOUNTER — Encounter: Payer: Self-pay | Admitting: Pulmonary Disease

## 2017-12-31 ENCOUNTER — Ambulatory Visit (INDEPENDENT_AMBULATORY_CARE_PROVIDER_SITE_OTHER): Payer: Medicare Other | Admitting: Pulmonary Disease

## 2017-12-31 VITALS — BP 122/68 | HR 67 | Ht 64.0 in | Wt 114.2 lb

## 2017-12-31 DIAGNOSIS — J449 Chronic obstructive pulmonary disease, unspecified: Secondary | ICD-10-CM

## 2017-12-31 MED ORDER — TIOTROPIUM BROMIDE-OLODATEROL 2.5-2.5 MCG/ACT IN AERS: 2.0000 | INHALATION_SPRAY | Freq: Every day | RESPIRATORY_TRACT | 0 refills | Status: DC

## 2017-12-31 MED ORDER — UMECLIDINIUM-VILANTEROL 62.5-25 MCG/INH IN AEPB: 1.0000 | INHALATION_SPRAY | Freq: Every day | RESPIRATORY_TRACT | 0 refills | Status: AC

## 2017-12-31 NOTE — Patient Instructions (Addendum)
I have reviewed your lung function test which was an improvement compared to 2015 Since the [place] is very expensive we will switch you to a different inhaler called Anoro which may work better for you We will give you samples of this  Follow-up in 3 months.

## 2017-12-31 NOTE — Progress Notes (Signed)
Kathy Pierce    [id number]    1925/12/14  Primary Care Physician:Gates, Butch Penny, MD  Referring Physician: [name], MD [place] [address] [place], [place] [address]  Chief complaint: Follow up for COPD  HPI: 84 year old with past medical history of lung cancer, COPD, essential thrombocythemia Followed by Dr. Julien Nordmann for suspicious left lower lobe nodule suspected to be bronchogenic carcinoma.  Bronchoscopy of the pelvis in January 2018 was nondiagnostic.  She underwent stereotactic radiation [completed March 2018] for this and has been under observation since then.  Maintained on Breo 100 for the past few years.  She feels this is helping with her breathing however the cost of the inhaler is sometimes excessive.  Has chronic dyspnea on exertion, mild symptoms at rest.  Chronic cough with white mucus production Denies any wheezing, fevers, chills.  Pets: No pets Occupation: Used to work as a Network engineer for a night agency Exposures: No known exposures, no mold, hot tub, Jacuzzi Smoking history: 21-pack-year smoker.  Quit in 1978 Travel history: Lived in [place] for few years.  No other significant travel Relevant family history: No significant family history of lung disease  Interim history: Continues on Breo.  Expense is getting to be too much.  She had to pay $300 per month at her last refill She is getting by with samples from primary care  Breathing is doing well with no issues.  Notices worsening of breathing whenever she is off the Breo inhaler  Outpatient Encounter Medications as of 12/31/2017  Medication Sig  . albuterol (PROVENTIL HFA;VENTOLIN HFA) 108 (90 Base) MCG/ACT inhaler Inhale 2 puffs into the lungs every 6 (six) hours as needed for wheezing or shortness of breath.  Marland Kitchen aspirin 81 MG tablet Take 81 mg by mouth daily.  Marland Kitchen atenolol (TENORMIN) 50 MG tablet Take 50 mg by mouth 2 (two) times daily.   . benzonatate (TESSALON) 100 MG capsule Take 100  mg by mouth 3 (three) times daily as needed for cough.  . calcium carbonate (OS-CAL - DOSED IN MG OF ELEMENTAL CALCIUM) 1250 MG tablet Take 1 tablet by mouth 2 (two) times daily with a meal.   . cyanocobalamin (,VITAMIN B-12,) 1000 MCG/ML injection Inject 1,000 mcg into the muscle every 30 (thirty) days.  . fluticasone furoate-vilanterol (BREO ELLIPTA) 100-25 MCG/INH AEPB Inhale 1 puff into the lungs daily.  . hydrochlorothiazide (MICROZIDE) 12.5 MG capsule Take 12.5 mg by mouth daily.  . hydroxyurea (HYDREA) 500 MG capsule Take 500 mg by mouth every other day. May take with food to minimize GI side effects. Reports alternating one day one day then two tablets the next.  . Hypromellose (ARTIFICIAL TEARS OP) Place 1 drop into both eyes as needed (for dry eyes).  . meclizine (ANTIVERT) 25 MG tablet Take 25 mg by mouth 3 (three) times daily as needed for dizziness.  . sertraline (ZOLOFT) 50 MG tablet Take 50 mg by mouth daily.  . traMADol (ULTRAM) 50 MG tablet Take 50 mg by mouth every 8 (eight) hours as needed for moderate pain.    No facility-administered encounter medications on file as of 12/31/2017.    Physical Exam: Blood pressure 122/68, pulse 67, height 5\' 4"  (1.626 m), weight 114 lb 3.2 oz (51.8 kg), SpO2 97 %. Gen:      No acute distress HEENT:  EOMI, sclera anicteric Neck:     No masses; no thyromegaly Lungs:    Clear to auscultation bilaterally; normal  respiratory effort CV:         Regular rate and rhythm; no murmurs Abd:      + bowel sounds; soft, non-tender; no palpable masses, no distension Ext:    No edema; adequate peripheral perfusion Skin:      Warm and dry; no rash Neuro: alert and oriented x 3 Psych: normal mood and affect  Data Reviewed: Imaging: PET scan 81/84/0375- hypermetabolic left lower lobe pulmonary nodule with AP window lymph node, precarinal and hilar lymphadenopathy. CT chest 11/05/2016- left lower lobe spiculated nodule with mild reticular changes in the  periphery. CT chest 11/16/2017- 18 to 10 mm left lower lobe nodule mildly decreased with surrounding radiation fibrosis.  No evidence of metastatic disease.  PFTs: 05/24/2013-  FVC 1.72 [72%), FEV1 0.97 [45%), F/F 56, TLC 89%, DLCO 23% Moderate-severe obstruction with severe diffusion defect.  12/17/2017 FVC 2.03 [92%], FEV1 1.17 [72%), F/F 58, TLC 85%, DLCO 34% Moderate obstruction with severe diffusion defect.  Labs: CBC 11/16/2017-WBC 6.1, eos 3%, absolute eosinophil count 183  Pathology Lung biopsy 02/11/2016-benign lung, with chronic inflammation, anthracosis. Lymph node biopsy of 7, 10 R-negative for malignancy.  Assessment:  Moderate COPD PFTs reviewed with moderate obstruction which is an improvement compared to 2015.  There is no significant bronchodilator response. Her peripheral eosinophils are low and she likely does not require inhaled steroids.  We will switch her to LABA/LAMA combination. Start anoro instead of breo  Left lower lobe lung cancer Status post XRT.  Stable on follow-up CT  Health maintenance 11/02/2017-influenza 09/29/2013-Prevnar 11/20/2001-Pneumovax  Plan/Recommendations: - Stop Breo, start anoro  Marshell Garfinkel MD [facility] 12/31/2017, 11:27 AM  CC: [name], MD

## 2018-01-05 DIAGNOSIS — R110 Nausea: Secondary | ICD-10-CM | POA: Diagnosis not present

## 2018-01-05 DIAGNOSIS — R0789 Other chest pain: Secondary | ICD-10-CM | POA: Diagnosis not present

## 2018-01-05 DIAGNOSIS — I959 Hypotension, unspecified: Secondary | ICD-10-CM | POA: Diagnosis not present

## 2018-01-05 DIAGNOSIS — R531 Weakness: Secondary | ICD-10-CM | POA: Diagnosis not present

## 2018-01-09 ENCOUNTER — Encounter: Payer: Self-pay | Admitting: Podiatry

## 2018-01-09 NOTE — Progress Notes (Signed)
Subjective: Kathy Pierce presents today with cc of painful corns which interfere with daily activities and routine tasks. Pain is aggravated when wearing enclosed shoe gear. Pain is getting progressively worse. She states she is able to wear Keds canvas sneakers and cannot wear leather shoes due to pain. Patient is seeking professional help regarding symptoms.  Past Medical History:  Diagnosis Date  . Cancer (HCC)    Squamous cell carcinoma, skin, basal cell carcinoma  . Complication of anesthesia    wakes up slowly   . COPD (chronic obstructive pulmonary disease) ([place])   . Depression   . Essential thrombocythemia ([place])   . Hypertension   . Lung cancer ([place]) dx'd 2017  . Osteoporosis   . Sciatica of left side 06/16/2012  . Spinal stenosis of lumbar region 06/16/2012  . Vitamin B12 deficiency     Patient Active Problem List   Diagnosis Date Noted  . Chronic obstructive pulmonary disease ([place]) 12/18/2017  . Essential hypertension 12/18/2017  . Major depression 12/18/2017  . Osteoporosis 12/18/2017  . Vitamin B deficiency 12/18/2017  . Essential thrombocythemia ([place]) 11/18/2016  . 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 Conductive hearing loss in right ear 04/25/2015  . Cerumen impaction 04/25/2015  . Sciatica of left side 06/16/2012  . Spinal stenosis of lumbar region 06/16/2012    Past Surgical History:  Procedure Laterality Date  . APPENDECTOMY    . CATARACT EXTRACTION, BILATERAL Bilateral   . STAPEDECTOMY Right   . TONSILLECTOMY    . VIDEO BRONCHOSCOPY WITH ENDOBRONCHIAL NAVIGATION N/A 02/11/2016   Procedure: VIDEO BRONCHOSCOPY WITH ENDOBRONCHIAL NAVIGATION;  Surgeon: Grace Isaac, MD;  Location: [facility];  Service: Thoracic;  Laterality: N/A;  . VIDEO BRONCHOSCOPY WITH ENDOBRONCHIAL ULTRASOUND N/A 02/11/2016   Procedure: VIDEO BRONCHOSCOPY WITH ENDOBRONCHIAL ULTRASOUND with biopsy of nodes #7 and #10;  Surgeon: Grace Isaac, MD;  [facility];  Service: Thoracic;  Laterality: N/A;     Current Outpatient Medications:  .  albuterol (PROVENTIL HFA;VENTOLIN HFA) 108 (90 Base) MCG/ACT inhaler, Inhale 2 puffs into the lungs every 6 (six) hours as needed for wheezing or shortness of breath., Disp: , Rfl:  .  aspirin 81 MG tablet, Take 81 mg by mouth daily., Disp: , Rfl:  .  atenolol (TENORMIN) 50 MG tablet, Take 50 mg by mouth 2 (two) times daily. , Disp: , Rfl:  .  benzonatate (TESSALON) 100 MG capsule, Take 100 mg by mouth 3 (three) times daily as needed for cough., Disp: , Rfl:  .  calcium carbonate (OS-CAL - DOSED IN MG OF ELEMENTAL CALCIUM) 1250 MG tablet, Take 1 tablet by mouth 2 (two) times daily with a meal. , Disp: , Rfl:  .  cyanocobalamin (,VITAMIN B-12,) 1000 MCG/ML injection, Inject 1,000 mcg into the muscle every 30 (thirty) days., Disp: , Rfl:  .  hydrochlorothiazide (MICROZIDE) 12.5 MG capsule, Take 12.5 mg by mouth daily., Disp: , Rfl:  .  hydroxyurea (HYDREA) 500 MG capsule, Take 500 mg by mouth every other day. May take with food to minimize GI side effects. Reports alternating one day one day then two tablets the next., Disp: , Rfl:  .  Hypromellose (ARTIFICIAL TEARS OP), Place 1 drop into both eyes as needed (for dry eyes)., Disp: , Rfl:  .  meclizine (ANTIVERT) 25 MG tablet, Take 25 mg by mouth 3 (three) times daily as needed for dizziness., Disp: , Rfl:  .  sertraline (ZOLOFT) 50  MG tablet, Take 50 mg by mouth daily., Disp: , Rfl:  .  traMADol (ULTRAM) 50 MG tablet, Take 50 mg by mouth every 8 (eight) hours as needed for moderate pain. , Disp: , Rfl: 1  Allergies  Allergen Reactions  . Penicillins Hives and Rash  . Codeine Nausea And Vomiting  . Erythromycin Nausea And Vomiting  . Sulfa Drugs Cross Reactors Nausea And Vomiting    Social History   Occupational History  . Not on file  Tobacco Use  . Smoking status: Former Smoker    Packs/day: 1.00    Years: 21.00    Pack years: 21.00    Types:  Cigarettes    Last attempt to quit: 07/10/1976    Years since quitting: 41.5  . Smokeless tobacco: Never Used  Substance and Sexual Activity  . Alcohol use: No  . Drug use: No  . Sexual activity: Never    Family History  Problem Relation Age of Onset  . Heart attack Father   . Cancer Mother   . Cancer Sister   . Cancer Brother   . Cancer Brother   . Cancer Daughter     Immunization History  Administered Date(s) Administered  . Influenza Split 11/23/2006, 12/15/2007, 11/30/2008, 11/26/2010, 12/04/2011, 11/19/2012  . Influenza, High Dose Seasonal PF 11/02/2017  . Influenza,inj,Quad PF,6+ Mos 10/31/2015  . Influenza,inj,quad, With Preservative 09/29/2013, 10/19/2014  . Influenza-Unspecified 11/14/2015  . Pneumococcal Conjugate-13 09/29/2013  . Pneumococcal Polysaccharide-23 11/20/2001  . Td 01/04/2001  . Tdap 08/21/2010  . Zoster 06/17/2006     Review of systems: Positive Findings in bold print.  Constitutional:  chills, fatigue, fever, sweats, weight change Communication: Optometrist, sign Ecologist, hand writing, iPad/Android device Eyes: diplopia, glare,  light sensitivity, eyeglasses, blindness Ears nose mouth throat: Hard of hearing, deaf, sign language,  vertigo,   bloody nose,  rhinitis,  cold sores, snoring Cardiovascular: HTN, edema, arrhythmia, pacemaker in place, defibrillator in place,  chest pain/tightness, chronic anticoagulation, blood clot Respiratory:  difficulty breathing, denies congestion, SOB, wheezing, cough Gastrointestinal: abdominal pain, diarrhea, nausea, vomiting,  Genitourinary:  nocturia,  pain on urination,  blood in urine, Foley catheter, urinary urgency Musculoskeletal: Uses mobility aid,  cramping, stiff joints, painful joints, back pain Skin: +changes in toenails, color change dryness, itchy skin, mole changes, or rash  Neurological: numbness, paresthesias, burning in feet, denies fainting,  seizure, change in speech. denies  headaches, memory problems/poor historian, cerebral palsy Endocrine: diabetes, hypothyroidism, hyperthyroidism,  dry mouth, flushing, denies heat intolerance,  cold intolerance,  excessive thirst, denies polyuria,  nocturia Hematological:  easy bleeding,  excessive bleeding, easy bruising, enlarged lymph nodes, on long term blood thinner Allergy/immunological:  hive, frequent infections, multiple drug allergies, seasonal allergies,  Psychiatric:  anxiety, depression, mood disorder, suicidal ideations, hallucinations   Objective: Vitals:   12/18/17 1123  BP: 125/62   Vascular Examination: Capillary refill time <3 seconds x 10 digits Dorsalis pedis and posterior tibial pulses present b/l No digital hair x 10 digits Skin temperature gradient WNL b/l  Dermatological Examination: Skin with normal turgor, texture and tone  Toenails 1-5 adequate length and well maintained b/l  Hyperkeratotic lesions dorsal 5th digit PIPJ  Musculoskeletal: Muscle strength 5/5 to all LE muscle groups  Hammertoe b/l 5th digit  Neurological: Sensation intact with 10 gram monofilament. Vibratory sensation intact.  Assessment: 1. Painful corn b/l 5th digit 2. Hammertoes b/l 5th digits 3. Pain in toes   Plan: 1. Discussed diagnoses and treatment options.  2. Corns  pared b/l 5th digit PIPJ 3. Patient to continue soft, supportive shoe gear 4. Patient to report any pedal injuries to medical professional immediately. 5. Follow up 3 months. Patient/POA to call should there be a concern in the interim.

## 2018-01-11 DIAGNOSIS — R5383 Other fatigue: Secondary | ICD-10-CM | POA: Diagnosis not present

## 2018-01-11 DIAGNOSIS — J449 Chronic obstructive pulmonary disease, unspecified: Secondary | ICD-10-CM | POA: Diagnosis not present

## 2018-01-11 DIAGNOSIS — K2970 Gastritis, unspecified, without bleeding: Secondary | ICD-10-CM | POA: Diagnosis not present

## 2018-02-23 DIAGNOSIS — I872 Venous insufficiency (chronic) (peripheral): Secondary | ICD-10-CM | POA: Diagnosis not present

## 2018-02-23 DIAGNOSIS — H43813 Vitreous degeneration, bilateral: Secondary | ICD-10-CM | POA: Diagnosis not present

## 2018-02-23 DIAGNOSIS — Z961 Presence of intraocular lens: Secondary | ICD-10-CM | POA: Diagnosis not present

## 2018-02-23 DIAGNOSIS — C44729 Squamous cell carcinoma of skin of left lower limb, including hip: Secondary | ICD-10-CM | POA: Diagnosis not present

## 2018-02-23 DIAGNOSIS — H35371 Puckering of macula, right eye: Secondary | ICD-10-CM | POA: Diagnosis not present

## 2018-02-23 DIAGNOSIS — D485 Neoplasm of uncertain behavior of skin: Secondary | ICD-10-CM | POA: Diagnosis not present

## 2018-02-23 DIAGNOSIS — D0472 Carcinoma in situ of skin of left lower limb, including hip: Secondary | ICD-10-CM | POA: Diagnosis not present

## 2018-02-23 DIAGNOSIS — H353132 Nonexudative age-related macular degeneration, bilateral, intermediate dry stage: Secondary | ICD-10-CM | POA: Diagnosis not present

## 2018-03-19 ENCOUNTER — Ambulatory Visit: Payer: Self-pay | Admitting: Podiatry

## 2018-03-23 DIAGNOSIS — C44729 Squamous cell carcinoma of skin of left lower limb, including hip: Secondary | ICD-10-CM | POA: Diagnosis not present

## 2018-03-23 DIAGNOSIS — D0472 Carcinoma in situ of skin of left lower limb, including hip: Secondary | ICD-10-CM | POA: Diagnosis not present

## 2018-04-02 DIAGNOSIS — H43392 Other vitreous opacities, left eye: Secondary | ICD-10-CM | POA: Diagnosis not present

## 2018-04-02 DIAGNOSIS — H353133 Nonexudative age-related macular degeneration, bilateral, advanced atrophic without subfoveal involvement: Secondary | ICD-10-CM | POA: Diagnosis not present

## 2018-04-02 DIAGNOSIS — H35373 Puckering of macula, bilateral: Secondary | ICD-10-CM | POA: Diagnosis not present

## 2018-04-02 DIAGNOSIS — H43813 Vitreous degeneration, bilateral: Secondary | ICD-10-CM | POA: Diagnosis not present

## 2018-04-05 ENCOUNTER — Ambulatory Visit: Payer: Medicare Other | Admitting: Pulmonary Disease

## 2018-06-02 DIAGNOSIS — J449 Chronic obstructive pulmonary disease, unspecified: Secondary | ICD-10-CM | POA: Diagnosis not present

## 2018-06-02 DIAGNOSIS — E538 Deficiency of other specified B group vitamins: Secondary | ICD-10-CM | POA: Diagnosis not present

## 2018-06-02 DIAGNOSIS — F322 Major depressive disorder, single episode, severe without psychotic features: Secondary | ICD-10-CM | POA: Diagnosis not present

## 2018-06-02 DIAGNOSIS — I10 Essential (primary) hypertension: Secondary | ICD-10-CM | POA: Diagnosis not present

## 2018-06-29 DIAGNOSIS — E538 Deficiency of other specified B group vitamins: Secondary | ICD-10-CM | POA: Diagnosis not present

## 2018-06-29 DIAGNOSIS — W19XXXA Unspecified fall, initial encounter: Secondary | ICD-10-CM | POA: Diagnosis not present

## 2018-06-29 DIAGNOSIS — R109 Unspecified abdominal pain: Secondary | ICD-10-CM | POA: Diagnosis not present

## 2018-06-29 DIAGNOSIS — R946 Abnormal results of thyroid function studies: Secondary | ICD-10-CM | POA: Diagnosis not present

## 2018-06-29 DIAGNOSIS — R609 Edema, unspecified: Secondary | ICD-10-CM | POA: Diagnosis not present

## 2018-06-29 DIAGNOSIS — J449 Chronic obstructive pulmonary disease, unspecified: Secondary | ICD-10-CM | POA: Diagnosis not present

## 2018-07-19 ENCOUNTER — Ambulatory Visit
Admission: RE | Admit: 2018-07-19 | Discharge: 2018-07-19 | Disposition: A | Discharge status: Home / Self Care | Payer: Medicare Other | Source: Ambulatory Visit | Attending: Family Medicine | Admitting: Family Medicine

## 2018-07-19 ENCOUNTER — Other Ambulatory Visit: Payer: Self-pay | Admitting: Family Medicine

## 2018-07-19 DIAGNOSIS — W19XXXS Unspecified fall, sequela: Secondary | ICD-10-CM

## 2018-07-19 DIAGNOSIS — M25512 Pain in left shoulder: Secondary | ICD-10-CM | POA: Diagnosis not present

## 2018-07-19 DIAGNOSIS — W19XXXA Unspecified fall, initial encounter: Secondary | ICD-10-CM | POA: Diagnosis not present

## 2018-07-19 DIAGNOSIS — S3992XA Unspecified injury of lower back, initial encounter: Secondary | ICD-10-CM | POA: Diagnosis not present

## 2018-07-19 DIAGNOSIS — S2232XA Fracture of one rib, left side, initial encounter for closed fracture: Secondary | ICD-10-CM | POA: Diagnosis not present

## 2018-07-28 DIAGNOSIS — Z961 Presence of intraocular lens: Secondary | ICD-10-CM | POA: Diagnosis not present

## 2018-07-28 DIAGNOSIS — H5712 Ocular pain, left eye: Secondary | ICD-10-CM | POA: Diagnosis not present

## 2018-07-28 DIAGNOSIS — H353132 Nonexudative age-related macular degeneration, bilateral, intermediate dry stage: Secondary | ICD-10-CM | POA: Diagnosis not present

## 2018-07-28 DIAGNOSIS — H04201 Unspecified epiphora, right lacrimal gland: Secondary | ICD-10-CM | POA: Diagnosis not present

## 2018-08-16 ENCOUNTER — Other Ambulatory Visit: Payer: Self-pay

## 2018-08-16 ENCOUNTER — Other Ambulatory Visit: Payer: Self-pay | Admitting: Family Medicine

## 2018-08-16 ENCOUNTER — Ambulatory Visit
Admission: RE | Admit: 2018-08-16 | Discharge: 2018-08-16 | Disposition: A | Discharge status: Home / Self Care | Payer: Medicare Other | Source: Ambulatory Visit | Attending: Family Medicine | Admitting: Family Medicine

## 2018-08-16 DIAGNOSIS — S2242XA Multiple fractures of ribs, left side, initial encounter for closed fracture: Secondary | ICD-10-CM | POA: Diagnosis not present

## 2018-08-16 DIAGNOSIS — T1490XA Injury, unspecified, initial encounter: Secondary | ICD-10-CM

## 2018-08-16 DIAGNOSIS — S299XXA Unspecified injury of thorax, initial encounter: Secondary | ICD-10-CM | POA: Diagnosis not present

## 2018-08-16 DIAGNOSIS — W19XXXA Unspecified fall, initial encounter: Secondary | ICD-10-CM | POA: Diagnosis not present

## 2018-08-23 DIAGNOSIS — E039 Hypothyroidism, unspecified: Secondary | ICD-10-CM | POA: Diagnosis not present

## 2018-08-25 ENCOUNTER — Ambulatory Visit
Admission: RE | Admit: 2018-08-25 | Discharge: 2018-08-25 | Disposition: A | Discharge status: Home / Self Care | Payer: Medicare Other | Source: Ambulatory Visit | Attending: Family Medicine | Admitting: Family Medicine

## 2018-08-25 ENCOUNTER — Other Ambulatory Visit: Payer: Self-pay | Admitting: Family Medicine

## 2018-08-25 DIAGNOSIS — S2232XA Fracture of one rib, left side, initial encounter for closed fracture: Secondary | ICD-10-CM | POA: Diagnosis not present

## 2018-08-25 DIAGNOSIS — S299XXA Unspecified injury of thorax, initial encounter: Secondary | ICD-10-CM

## 2018-09-29 DIAGNOSIS — M810 Age-related osteoporosis without current pathological fracture: Secondary | ICD-10-CM | POA: Diagnosis not present

## 2018-09-29 DIAGNOSIS — S2249XA Multiple fractures of ribs, unspecified side, initial encounter for closed fracture: Secondary | ICD-10-CM | POA: Diagnosis not present

## 2018-09-29 DIAGNOSIS — Z23 Encounter for immunization: Secondary | ICD-10-CM | POA: Diagnosis not present

## 2018-09-29 DIAGNOSIS — I10 Essential (primary) hypertension: Secondary | ICD-10-CM | POA: Diagnosis not present

## 2018-10-26 ENCOUNTER — Ambulatory Visit
Admission: RE | Admit: 2018-10-26 | Discharge: 2018-10-26 | Disposition: A | Discharge status: Home / Self Care | Payer: Medicare Other | Source: Ambulatory Visit | Attending: Family Medicine | Admitting: Family Medicine

## 2018-10-26 ENCOUNTER — Other Ambulatory Visit: Payer: Self-pay | Admitting: Family Medicine

## 2018-10-26 DIAGNOSIS — S2242XA Multiple fractures of ribs, left side, initial encounter for closed fracture: Secondary | ICD-10-CM | POA: Diagnosis not present

## 2018-10-26 DIAGNOSIS — S2242XS Multiple fractures of ribs, left side, sequela: Secondary | ICD-10-CM

## 2018-10-27 DIAGNOSIS — R2689 Other abnormalities of gait and mobility: Secondary | ICD-10-CM | POA: Diagnosis not present

## 2018-10-27 DIAGNOSIS — S2249XD Multiple fractures of ribs, unspecified side, subsequent encounter for fracture with routine healing: Secondary | ICD-10-CM | POA: Diagnosis not present

## 2018-11-02 DIAGNOSIS — C44729 Squamous cell carcinoma of skin of left lower limb, including hip: Secondary | ICD-10-CM | POA: Diagnosis not present

## 2018-11-02 DIAGNOSIS — C44622 Squamous cell carcinoma of skin of right upper limb, including shoulder: Secondary | ICD-10-CM | POA: Diagnosis not present

## 2018-11-02 DIAGNOSIS — L570 Actinic keratosis: Secondary | ICD-10-CM | POA: Diagnosis not present

## 2018-11-02 DIAGNOSIS — L814 Other melanin hyperpigmentation: Secondary | ICD-10-CM | POA: Diagnosis not present

## 2018-11-02 DIAGNOSIS — Z85828 Personal history of other malignant neoplasm of skin: Secondary | ICD-10-CM | POA: Diagnosis not present

## 2018-11-02 DIAGNOSIS — D485 Neoplasm of uncertain behavior of skin: Secondary | ICD-10-CM | POA: Diagnosis not present

## 2018-11-19 ENCOUNTER — Ambulatory Visit (HOSPITAL_COMMUNITY)
Admission: RE | Admit: 2018-11-19 | Discharge: 2018-11-19 | Disposition: A | Discharge status: Home / Self Care | Payer: Medicare Other | Source: Ambulatory Visit | Attending: Internal Medicine | Admitting: Internal Medicine

## 2018-11-19 ENCOUNTER — Other Ambulatory Visit: Payer: Self-pay

## 2018-11-19 ENCOUNTER — Inpatient Hospital Stay: Payer: Medicare Other | Attending: Internal Medicine

## 2018-11-19 DIAGNOSIS — D473 Essential (hemorrhagic) thrombocythemia: Secondary | ICD-10-CM | POA: Insufficient documentation

## 2018-11-19 DIAGNOSIS — I517 Cardiomegaly: Secondary | ICD-10-CM | POA: Insufficient documentation

## 2018-11-19 DIAGNOSIS — C3432 Malignant neoplasm of lower lobe, left bronchus or lung: Secondary | ICD-10-CM | POA: Diagnosis not present

## 2018-11-19 DIAGNOSIS — I700 Atherosclerosis of aorta: Secondary | ICD-10-CM | POA: Diagnosis not present

## 2018-11-19 DIAGNOSIS — Z881 Allergy status to other antibiotic agents status: Secondary | ICD-10-CM | POA: Diagnosis not present

## 2018-11-19 DIAGNOSIS — C3490 Malignant neoplasm of unspecified part of unspecified bronchus or lung: Secondary | ICD-10-CM

## 2018-11-19 DIAGNOSIS — J439 Emphysema, unspecified: Secondary | ICD-10-CM | POA: Insufficient documentation

## 2018-11-19 DIAGNOSIS — Z880 Allergy status to penicillin: Secondary | ICD-10-CM | POA: Insufficient documentation

## 2018-11-19 DIAGNOSIS — Z885 Allergy status to narcotic agent status: Secondary | ICD-10-CM | POA: Insufficient documentation

## 2018-11-19 DIAGNOSIS — R0609 Other forms of dyspnea: Secondary | ICD-10-CM | POA: Diagnosis not present

## 2018-11-19 DIAGNOSIS — Z85828 Personal history of other malignant neoplasm of skin: Secondary | ICD-10-CM | POA: Diagnosis not present

## 2018-11-19 DIAGNOSIS — Z923 Personal history of irradiation: Secondary | ICD-10-CM | POA: Diagnosis not present

## 2018-11-19 DIAGNOSIS — Z882 Allergy status to sulfonamides status: Secondary | ICD-10-CM | POA: Diagnosis not present

## 2018-11-19 DIAGNOSIS — K8020 Calculus of gallbladder without cholecystitis without obstruction: Secondary | ICD-10-CM | POA: Insufficient documentation

## 2018-11-19 DIAGNOSIS — Z79899 Other long term (current) drug therapy: Secondary | ICD-10-CM | POA: Diagnosis not present

## 2018-11-19 LAB — CBC WITH DIFFERENTIAL (CANCER CENTER ONLY)
Abs Immature Granulocytes: 0.02 10*3/uL (ref 0.00–0.07)
Basophils Absolute: 0 10*3/uL (ref 0.0–0.1)
Basophils Relative: 0 %
Eosinophils Absolute: 0.1 10*3/uL (ref 0.0–0.5)
Eosinophils Relative: 2 %
HCT: 43 % (ref 36.0–46.0)
Hemoglobin: 14.4 g/dL (ref 12.0–15.0)
Immature Granulocytes: 0 %
Lymphocytes Relative: 11 %
Lymphs Abs: 0.7 10*3/uL (ref 0.7–4.0)
MCH: 37.2 pg — ABNORMAL HIGH (ref 26.0–34.0)
MCHC: 33.5 g/dL (ref 30.0–36.0)
MCV: 111.1 fL — ABNORMAL HIGH (ref 80.0–100.0)
Monocytes Absolute: 1 10*3/uL (ref 0.1–1.0)
Monocytes Relative: 15 %
Neutro Abs: 4.8 10*3/uL (ref 1.7–7.7)
Neutrophils Relative %: 72 %
Platelet Count: 538 10*3/uL — ABNORMAL HIGH (ref 150–400)
RBC: 3.87 MIL/uL (ref 3.87–5.11)
RDW: 16 % — ABNORMAL HIGH (ref 11.5–15.5)
WBC Count: 6.6 10*3/uL (ref 4.0–10.5)
nRBC: 0 % (ref 0.0–0.2)

## 2018-11-19 LAB — CMP (CANCER CENTER ONLY)
ALT: 13 U/L (ref 0–44)
AST: 22 U/L (ref 15–41)
Albumin: 3.7 g/dL (ref 3.5–5.0)
Alkaline Phosphatase: 62 U/L (ref 38–126)
Anion gap: 9 (ref 5–15)
BUN: 21 mg/dL (ref 8–23)
CO2: 31 mmol/L (ref 22–32)
Calcium: 9.9 mg/dL (ref 8.9–10.3)
Chloride: 97 mmol/L — ABNORMAL LOW (ref 98–111)
Creatinine: 0.74 mg/dL (ref 0.44–1.00)
GFR, Est AFR Am: >60 mL/min (ref >60)
GFR, Estimated: >60 mL/min (ref >60)
Glucose, Bld: 113 mg/dL — ABNORMAL HIGH (ref 70–99)
Potassium: 3.8 mmol/L (ref 3.5–5.1)
Sodium: 137 mmol/L (ref 135–145)
Total Bilirubin: 0.7 mg/dL (ref 0.3–1.2)
Total Protein: 6.6 g/dL (ref 6.5–8.1)

## 2018-11-19 MED ORDER — SODIUM CHLORIDE (PF) 0.9 % IJ SOLN
INTRAMUSCULAR | Status: AC
  Filled 2018-11-19: qty 50

## 2018-11-19 MED ORDER — IOHEXOL 300 MG/ML  SOLN
75.0000 mL | Freq: Once | INTRAMUSCULAR | Status: AC | PRN
  Administered 2018-11-19: 75 mL via INTRAVENOUS

## 2018-11-23 ENCOUNTER — Encounter: Payer: Self-pay | Admitting: Internal Medicine

## 2018-11-23 ENCOUNTER — Inpatient Hospital Stay: Payer: Medicare Other | Admitting: Internal Medicine

## 2018-11-23 ENCOUNTER — Other Ambulatory Visit: Payer: Self-pay

## 2018-11-23 VITALS — BP 153/70 | HR 83 | Temp 98.3°F | Resp 18 | Ht 64.0 in | Wt 110.8 lb

## 2018-11-23 DIAGNOSIS — D473 Essential (hemorrhagic) thrombocythemia: Secondary | ICD-10-CM | POA: Diagnosis not present

## 2018-11-23 DIAGNOSIS — C3432 Malignant neoplasm of lower lobe, left bronchus or lung: Secondary | ICD-10-CM | POA: Diagnosis not present

## 2018-11-23 DIAGNOSIS — Z85828 Personal history of other malignant neoplasm of skin: Secondary | ICD-10-CM | POA: Diagnosis not present

## 2018-11-23 DIAGNOSIS — I517 Cardiomegaly: Secondary | ICD-10-CM | POA: Diagnosis not present

## 2018-11-23 DIAGNOSIS — Z880 Allergy status to penicillin: Secondary | ICD-10-CM | POA: Diagnosis not present

## 2018-11-23 DIAGNOSIS — I700 Atherosclerosis of aorta: Secondary | ICD-10-CM | POA: Diagnosis not present

## 2018-11-23 DIAGNOSIS — Z923 Personal history of irradiation: Secondary | ICD-10-CM | POA: Diagnosis not present

## 2018-11-23 DIAGNOSIS — Z79899 Other long term (current) drug therapy: Secondary | ICD-10-CM | POA: Diagnosis not present

## 2018-11-23 DIAGNOSIS — Z882 Allergy status to sulfonamides status: Secondary | ICD-10-CM | POA: Diagnosis not present

## 2018-11-23 DIAGNOSIS — Z885 Allergy status to narcotic agent status: Secondary | ICD-10-CM | POA: Diagnosis not present

## 2018-11-23 DIAGNOSIS — C3490 Malignant neoplasm of unspecified part of unspecified bronchus or lung: Secondary | ICD-10-CM

## 2018-11-23 DIAGNOSIS — R0609 Other forms of dyspnea: Secondary | ICD-10-CM | POA: Diagnosis not present

## 2018-11-23 DIAGNOSIS — Z881 Allergy status to other antibiotic agents status: Secondary | ICD-10-CM | POA: Diagnosis not present

## 2018-11-23 DIAGNOSIS — J439 Emphysema, unspecified: Secondary | ICD-10-CM | POA: Diagnosis not present

## 2018-11-23 DIAGNOSIS — K8020 Calculus of gallbladder without cholecystitis without obstruction: Secondary | ICD-10-CM | POA: Diagnosis not present

## 2018-11-23 NOTE — Progress Notes (Signed)
[facility] [phone]   [phone]  OFFICE PROGRESS NOTE  London Pepper, MD [address] [place] [address]  DIAGNOSIS:  1) Questionable lung cancer presented with left lower lobe and questionable mediastinal and hilar lymph nodes. 2) essential thrombocythemia.  PRIOR THERAPY: SBRT to the left lower lobe lung nodule under the care of Dr. Tammi Klippel completed on 04/16/2016.  CURRENT THERAPY: hydroxyurea 500 mg by mouth daily.  INTERVAL HISTORY: Kathy Pierce 85 y.o. female returns to the clinic today for follow-up visit.  The patient is feeling fine today with no concerning complaints except for mild shortness of breath with exertion.  She denied having any chest pain, cough or hemoptysis.  She denied having any weight loss or night sweats.  She has no nausea, vomiting, diarrhea or constipation.  She has no headache or visual changes.  She had repeat CT scan of the chest performed recently and she is here for evaluation and discussion of her scan results.  MEDICAL HISTORY: Past Medical History:  Diagnosis Date   Cancer ([place])    Squamous cell carcinoma, skin, basal cell carcinoma   Complication of anesthesia    wakes up slowly    COPD (chronic obstructive pulmonary disease) ([place])    Depression    Essential thrombocythemia ([place])    Hypertension    Lung cancer ([place]) dx'd 2017   Osteoporosis    Sciatica of left side 06/16/2012   Spinal stenosis of lumbar region 06/16/2012   Vitamin B12 deficiency     ALLERGIES:  is allergic to penicillins; codeine; erythromycin; and sulfa drugs cross reactors.  MEDICATIONS:  Current Outpatient Medications  Medication Sig Dispense Refill   albuterol (PROVENTIL HFA;VENTOLIN HFA) 108 (90 Base) MCG/ACT inhaler Inhale 2 puffs into the lungs every 6 (six) hours as needed for wheezing or shortness of breath.     aspirin 81 MG tablet Take 81 mg by mouth daily.     atenolol  (TENORMIN) 50 MG tablet Take 50 mg by mouth 2 (two) times daily.      benzonatate (TESSALON) 100 MG capsule Take 100 mg by mouth 3 (three) times daily as needed for cough.     calcium carbonate (OS-CAL - DOSED IN MG OF ELEMENTAL CALCIUM) 1250 MG tablet Take 1 tablet by mouth 2 (two) times daily with a meal.      cyanocobalamin (,VITAMIN B-12,) 1000 MCG/ML injection Inject 1,000 mcg into the muscle every 30 (thirty) days.     hydrochlorothiazide (MICROZIDE) 12.5 MG capsule Take 12.5 mg by mouth daily.     hydroxyurea (HYDREA) 500 MG capsule Take 500 mg by mouth every other day. May take with food to minimize GI side effects. Reports alternating one day one day then two tablets the next.     Hypromellose (ARTIFICIAL TEARS OP) Place 1 drop into both eyes as needed (for dry eyes).     levothyroxine (SYNTHROID) 25 MCG tablet Take 25 mcg by mouth daily.     meclizine (ANTIVERT) 25 MG tablet Take 25 mg by mouth 3 (three) times daily as needed for dizziness.     sertraline (ZOLOFT) 50 MG tablet Take 50 mg by mouth daily.     traMADol (ULTRAM) 50 MG tablet Take 50 mg by mouth every 8 (eight) hours as needed for moderate pain.   1   No current facility-administered medications for this visit.     SURGICAL HISTORY:  Past Surgical History:  Procedure  Laterality Date   APPENDECTOMY     CATARACT EXTRACTION, BILATERAL Bilateral    STAPEDECTOMY Right    TONSILLECTOMY     VIDEO BRONCHOSCOPY WITH ENDOBRONCHIAL NAVIGATION N/A 02/11/2016   Procedure: VIDEO BRONCHOSCOPY WITH ENDOBRONCHIAL NAVIGATION;  Surgeon: Grace Isaac, MD;  Location: [facility];  Service: Thoracic;  Laterality: N/A;   VIDEO BRONCHOSCOPY WITH ENDOBRONCHIAL ULTRASOUND N/A 02/11/2016   Procedure: VIDEO BRONCHOSCOPY WITH ENDOBRONCHIAL ULTRASOUND with biopsy of nodes #7 and #10;  Surgeon: Grace Isaac, MD;  [facility];  Service: Thoracic;  Laterality: N/A;    REVIEW OF SYSTEMS:  A comprehensive review of systems was  negative except for: Respiratory: positive for dyspnea on exertion   PHYSICAL EXAMINATION: General appearance: alert, cooperative and no distress Head: Normocephalic, without obvious abnormality, atraumatic Neck: no adenopathy, no JVD, supple, symmetrical, trachea midline and thyroid not enlarged, symmetric, no tenderness/mass/nodules Lymph nodes: Cervical, supraclavicular, and axillary nodes normal. Resp: clear to auscultation bilaterally Back: symmetric, no curvature. ROM normal. No CVA tenderness. Cardio: regular rate and rhythm, S1, S2 normal, no murmur, click, rub or gallop GI: soft, non-tender; bowel sounds normal; no masses,  no organomegaly Extremities: extremities normal, atraumatic, no cyanosis or edema  ECOG PERFORMANCE STATUS: 1 - Symptomatic but completely ambulatory  Blood pressure (!) 153/70, pulse 83, temperature 98.3 F (36.8 C), temperature source Temporal, resp. rate 18, height 5\' 4"  (1.626 m), weight 110 lb 12.8 oz (50.3 kg), SpO2 100 %.  LABORATORY DATA: Lab Results  Component Value Date   WBC 6.6 11/19/2018   HGB 14.4 11/19/2018   HCT 43.0 11/19/2018   MCV 111.1 (H) 11/19/2018   PLT 538 (H) 11/19/2018      Chemistry      Component Value Date/Time   NA 137 11/19/2018 1340   NA 137 11/05/2016 1031   K 3.8 11/19/2018 1340   K 4.4 11/05/2016 1031   CL 97 (L) 11/19/2018 1340   CO2 31 11/19/2018 1340   CO2 28 11/05/2016 1031   BUN 21 11/19/2018 1340   BUN 19.1 11/05/2016 1031   CREATININE 0.74 11/19/2018 1340   CREATININE 0.7 11/05/2016 1031      Component Value Date/Time   CALCIUM 9.9 11/19/2018 1340   CALCIUM 9.4 11/05/2016 1031   ALKPHOS 62 11/19/2018 1340   ALKPHOS 58 11/05/2016 1031   AST 22 11/19/2018 1340   AST 20 11/05/2016 1031   ALT 13 11/19/2018 1340   ALT 9 11/05/2016 1031   BILITOT 0.7 11/19/2018 1340   BILITOT 0.45 11/05/2016 1031       RADIOGRAPHIC STUDIES: Dg Ribs Unilateral W/chest Left  Result Date: 10/26/2018 CLINICAL  DATA:  Multiple left rib fractures. EXAM: LEFT RIBS AND CHEST - 3+ VIEW COMPARISON:  Radiographs of August 25, 2018. FINDINGS: Mild cardiomegaly is noted. No pneumothorax or pleural effusion is noted. Stable scarring is noted in left midlung. Stable mildly displaced left fifth rib fracture is noted. At least 1 other stable old rib fracture is noted. No new fractures are noted. IMPRESSION: Old left rib fractures as described above. No acute abnormality seen. Electronically Signed   By: Marijo Conception M.D.   On: 10/26/2018 15:13   Ct Chest W Contrast  Result Date: 11/19/2018 CLINICAL DATA:  Lung cancer, status post XRT EXAM: CT CHEST WITH CONTRAST TECHNIQUE: Multidetector CT imaging of the chest was performed during intravenous contrast administration. CONTRAST:  68mL OMNIPAQUE IOHEXOL 300 MG/ML  SOLN COMPARISON:  11/16/2017 FINDINGS: Cardiovascular: Heart is normal in  size.  No pericardial effusion. No evidence of thoracic aortic aneurysm. Atherosclerotic calcifications of the aortic root/arch. Three vessel coronary atherosclerosis. Mediastinum/Nodes: No suspicious mediastinal lymphadenopathy. Visualized thyroid is unremarkable. Lungs/Pleura: Right apical pleural-parenchymal scarring. Radiation changes and fiducial markers in the superior segment left lower lobe. Underlying 8 mm nodule (series 5/image 86), grossly unchanged. Moderate centrilobular and paraseptal emphysematous changes. No new/suspicious pulmonary nodules. No focal consolidation. No pleural effusion or pneumothorax. Upper Abdomen: Visualized upper abdomen is notable for cholelithiasis and vascular calcifications. Musculoskeletal: Moderate inferior endplate compression fracture deformity of a midthoracic vertebral body. IMPRESSION: Radiation changes in the superior segment left lower lobe. Underlying 8 mm nodule, grossly unchanged. No evidence of recurrent or metastatic disease. Aortic Atherosclerosis (ICD10-I70.0) and Emphysema (ICD10-J43.9).  Electronically Signed   By: Julian Hy M.D.   On: 11/19/2018 16:12    ASSESSMENT AND PLAN:  This is a very pleasant 85 years old white female with suspicious primary bronchogenic carcinoma involving the left lower lobe with a pulmonary nodule status post stereotactic radiotherapy under the care of Dr. Tammi Klippel. The patient is currently on observation and she is feeling fine. She had repeat CT scan of the chest performed recently.  I personally and independently reviewed the scans and discussed the results with the patient today. Her scan showed no concerning findings for disease progression. I recommended for the patient to continue on observation with repeat CT scan of the chest in 1 year. For the essential thrombocythemia, the patient will continue her current treatment with hydroxyurea. The patient was advised to call immediately if she has any concerning symptoms in the interval. The patient voices understanding of current disease status and treatment options and is in agreement with the current care plan. All questions were answered. The patient knows to call the clinic with any problems, questions or concerns. We can certainly see the patient much sooner if necessary. I spent 10 minutes counseling the patient face to face. The total time spent in the appointment was 15 minutes.  Disclaimer: This note was dictated with voice recognition software. Similar sounding words can inadvertently be transcribed and may not be corrected upon review.

## 2018-11-24 ENCOUNTER — Telehealth: Payer: Self-pay | Admitting: Internal Medicine

## 2018-11-24 DIAGNOSIS — C44729 Squamous cell carcinoma of skin of left lower limb, including hip: Secondary | ICD-10-CM | POA: Diagnosis not present

## 2018-11-24 NOTE — Telephone Encounter (Signed)
Scheduled appt per 10/27 los.  Sent a staff message to get a calendar mailed out with central radiology number attached to it

## 2018-12-09 DIAGNOSIS — D485 Neoplasm of uncertain behavior of skin: Secondary | ICD-10-CM | POA: Diagnosis not present

## 2018-12-09 DIAGNOSIS — L905 Scar conditions and fibrosis of skin: Secondary | ICD-10-CM | POA: Diagnosis not present

## 2018-12-09 DIAGNOSIS — C44622 Squamous cell carcinoma of skin of right upper limb, including shoulder: Secondary | ICD-10-CM | POA: Diagnosis not present

## 2018-12-09 DIAGNOSIS — C44722 Squamous cell carcinoma of skin of right lower limb, including hip: Secondary | ICD-10-CM | POA: Diagnosis not present

## 2018-12-15 DIAGNOSIS — C44622 Squamous cell carcinoma of skin of right upper limb, including shoulder: Secondary | ICD-10-CM | POA: Diagnosis not present

## 2018-12-30 DIAGNOSIS — I10 Essential (primary) hypertension: Secondary | ICD-10-CM | POA: Diagnosis not present

## 2018-12-30 DIAGNOSIS — Z0000 Encounter for general adult medical examination without abnormal findings: Secondary | ICD-10-CM | POA: Diagnosis not present

## 2018-12-30 DIAGNOSIS — M810 Age-related osteoporosis without current pathological fracture: Secondary | ICD-10-CM | POA: Diagnosis not present

## 2018-12-30 DIAGNOSIS — J449 Chronic obstructive pulmonary disease, unspecified: Secondary | ICD-10-CM | POA: Diagnosis not present

## 2019-01-07 DIAGNOSIS — H04123 Dry eye syndrome of bilateral lacrimal glands: Secondary | ICD-10-CM | POA: Diagnosis not present

## 2019-01-07 DIAGNOSIS — Z961 Presence of intraocular lens: Secondary | ICD-10-CM | POA: Diagnosis not present

## 2019-01-07 DIAGNOSIS — H353132 Nonexudative age-related macular degeneration, bilateral, intermediate dry stage: Secondary | ICD-10-CM | POA: Diagnosis not present

## 2019-01-07 DIAGNOSIS — H43813 Vitreous degeneration, bilateral: Secondary | ICD-10-CM | POA: Diagnosis not present

## 2019-01-13 DIAGNOSIS — C44622 Squamous cell carcinoma of skin of right upper limb, including shoulder: Secondary | ICD-10-CM | POA: Diagnosis not present

## 2019-01-17 DIAGNOSIS — I10 Essential (primary) hypertension: Secondary | ICD-10-CM | POA: Diagnosis not present

## 2019-01-17 DIAGNOSIS — J449 Chronic obstructive pulmonary disease, unspecified: Secondary | ICD-10-CM | POA: Diagnosis not present

## 2019-01-17 DIAGNOSIS — R350 Frequency of micturition: Secondary | ICD-10-CM | POA: Diagnosis not present

## 2019-01-17 DIAGNOSIS — Z0000 Encounter for general adult medical examination without abnormal findings: Secondary | ICD-10-CM | POA: Diagnosis not present

## 2019-01-27 DIAGNOSIS — C44722 Squamous cell carcinoma of skin of right lower limb, including hip: Secondary | ICD-10-CM | POA: Diagnosis not present

## 2019-03-02 DIAGNOSIS — C44722 Squamous cell carcinoma of skin of right lower limb, including hip: Secondary | ICD-10-CM | POA: Diagnosis not present

## 2019-03-02 DIAGNOSIS — L570 Actinic keratosis: Secondary | ICD-10-CM | POA: Diagnosis not present

## 2019-03-02 DIAGNOSIS — D485 Neoplasm of uncertain behavior of skin: Secondary | ICD-10-CM | POA: Diagnosis not present

## 2019-03-02 DIAGNOSIS — Z23 Encounter for immunization: Secondary | ICD-10-CM | POA: Diagnosis not present

## 2019-03-02 DIAGNOSIS — C44729 Squamous cell carcinoma of skin of left lower limb, including hip: Secondary | ICD-10-CM | POA: Diagnosis not present

## 2019-03-02 DIAGNOSIS — C44329 Squamous cell carcinoma of skin of other parts of face: Secondary | ICD-10-CM | POA: Diagnosis not present

## 2019-03-21 DIAGNOSIS — C44722 Squamous cell carcinoma of skin of right lower limb, including hip: Secondary | ICD-10-CM | POA: Diagnosis not present

## 2019-03-24 DIAGNOSIS — C44329 Squamous cell carcinoma of skin of other parts of face: Secondary | ICD-10-CM | POA: Diagnosis not present

## 2019-04-04 DIAGNOSIS — C44729 Squamous cell carcinoma of skin of left lower limb, including hip: Secondary | ICD-10-CM | POA: Diagnosis not present

## 2019-04-10 DIAGNOSIS — R05 Cough: Secondary | ICD-10-CM | POA: Diagnosis not present

## 2019-04-10 DIAGNOSIS — Z1152 Encounter for screening for COVID-19: Secondary | ICD-10-CM | POA: Diagnosis not present

## 2019-04-10 DIAGNOSIS — R062 Wheezing: Secondary | ICD-10-CM | POA: Diagnosis not present

## 2019-04-10 DIAGNOSIS — Z8709 Personal history of other diseases of the respiratory system: Secondary | ICD-10-CM | POA: Diagnosis not present

## 2019-04-11 DIAGNOSIS — J441 Chronic obstructive pulmonary disease with (acute) exacerbation: Secondary | ICD-10-CM | POA: Diagnosis not present

## 2019-04-11 DIAGNOSIS — I10 Essential (primary) hypertension: Secondary | ICD-10-CM | POA: Diagnosis not present

## 2019-04-22 ENCOUNTER — Ambulatory Visit
Admission: RE | Admit: 2019-04-22 | Discharge: 2019-04-22 | Disposition: A | Discharge status: Home / Self Care | Payer: Medicare Other | Source: Ambulatory Visit | Attending: Family Medicine | Admitting: Family Medicine

## 2019-04-22 ENCOUNTER — Other Ambulatory Visit: Payer: Self-pay | Admitting: Family Medicine

## 2019-04-22 ENCOUNTER — Other Ambulatory Visit: Payer: Self-pay

## 2019-04-22 DIAGNOSIS — J441 Chronic obstructive pulmonary disease with (acute) exacerbation: Secondary | ICD-10-CM

## 2019-05-09 DIAGNOSIS — I10 Essential (primary) hypertension: Secondary | ICD-10-CM | POA: Diagnosis not present

## 2019-05-09 DIAGNOSIS — J441 Chronic obstructive pulmonary disease with (acute) exacerbation: Secondary | ICD-10-CM | POA: Diagnosis not present

## 2019-05-11 DIAGNOSIS — L821 Other seborrheic keratosis: Secondary | ICD-10-CM | POA: Diagnosis not present

## 2019-05-11 DIAGNOSIS — D0472 Carcinoma in situ of skin of left lower limb, including hip: Secondary | ICD-10-CM | POA: Diagnosis not present

## 2019-05-11 DIAGNOSIS — D0461 Carcinoma in situ of skin of right upper limb, including shoulder: Secondary | ICD-10-CM | POA: Diagnosis not present

## 2019-05-11 DIAGNOSIS — D225 Melanocytic nevi of trunk: Secondary | ICD-10-CM | POA: Diagnosis not present

## 2019-05-11 DIAGNOSIS — D485 Neoplasm of uncertain behavior of skin: Secondary | ICD-10-CM | POA: Diagnosis not present

## 2019-05-11 DIAGNOSIS — L570 Actinic keratosis: Secondary | ICD-10-CM | POA: Diagnosis not present

## 2019-05-11 DIAGNOSIS — C44329 Squamous cell carcinoma of skin of other parts of face: Secondary | ICD-10-CM | POA: Diagnosis not present

## 2019-05-11 DIAGNOSIS — C44622 Squamous cell carcinoma of skin of right upper limb, including shoulder: Secondary | ICD-10-CM | POA: Diagnosis not present

## 2019-05-11 DIAGNOSIS — C44519 Basal cell carcinoma of skin of other part of trunk: Secondary | ICD-10-CM | POA: Diagnosis not present

## 2019-05-18 DIAGNOSIS — C44329 Squamous cell carcinoma of skin of other parts of face: Secondary | ICD-10-CM | POA: Diagnosis not present

## 2019-06-02 DIAGNOSIS — C44622 Squamous cell carcinoma of skin of right upper limb, including shoulder: Secondary | ICD-10-CM | POA: Diagnosis not present

## 2019-06-02 DIAGNOSIS — D0461 Carcinoma in situ of skin of right upper limb, including shoulder: Secondary | ICD-10-CM | POA: Diagnosis not present

## 2019-06-02 DIAGNOSIS — C44519 Basal cell carcinoma of skin of other part of trunk: Secondary | ICD-10-CM | POA: Diagnosis not present

## 2019-06-02 DIAGNOSIS — L905 Scar conditions and fibrosis of skin: Secondary | ICD-10-CM | POA: Diagnosis not present

## 2019-06-07 ENCOUNTER — Ambulatory Visit: Payer: Medicare Other | Admitting: Pulmonary Disease

## 2019-06-07 ENCOUNTER — Other Ambulatory Visit: Payer: Self-pay

## 2019-06-07 ENCOUNTER — Encounter: Payer: Self-pay | Admitting: Pulmonary Disease

## 2019-06-07 VITALS — BP 116/78 | HR 81 | Temp 97.2°F | Ht 63.0 in | Wt 112.6 lb

## 2019-06-07 DIAGNOSIS — J449 Chronic obstructive pulmonary disease, unspecified: Secondary | ICD-10-CM | POA: Diagnosis not present

## 2019-06-07 NOTE — Patient Instructions (Signed)
Thank you for trusting [place] with your care. We will make a referral to our pharmacy team and ask for them to find a LAMA/LABA combination which is affordable on your insurance plan. Please follow-up in 1 year.  - referral to pharmacy for LAMA/LABA  - Follow up in 1 year

## 2019-06-07 NOTE — Progress Notes (Signed)
Kathy Pierce    [id number]    03-23-1925  Primary Care Physician:Morrow, Marjory Lies, MD  Referring Physician: London Pepper, MD [address],  [place] [address]  Chief complaint: Follow up for COPD  HPI: 86 year old with past medical history of lung cancer, COPD, essential thrombocythemia Followed by Dr. Julien Nordmann for suspicious left lower lobe nodule suspected to be bronchogenic carcinoma.  Bronchoscopy of the pelvis in January 2018 was nondiagnostic.  She underwent stereotactic radiation [completed March 2018] for this and has been under observation since then.  Maintained on Breo 100 for the past few years.  She feels this is helping with her breathing however the cost of the inhaler is sometimes excessive.  Has chronic dyspnea on exertion, mild symptoms at rest.  Chronic cough with white mucus production Denies any wheezing, fevers, chills.  Pets: No pets Occupation: Used to work as a Network engineer for a night agency Exposures: No known exposures, no mold, hot tub, Jacuzzi Smoking history: 21-pack-year smoker.  Quit in 1978 Travel history: Lived in [place] for few years.  No other significant travel Relevant family history: No significant family history of lung disease  12/31/2017 Continues on [place].  Expense is getting to be too much.  She had to pay $300 per month at her last refill She is getting by with samples from primary care Breathing is doing well with no issues.  Notices worsening of breathing whenever she is off the Breo inhaler  Interim history: Patient continues to use albuterol PRN. She does not use a long acting inhaler because it is unaffordable.She experiences shortness of breath with walking short distances.   Denies any cough or productive sputum.   Following with her oncologist for lung nodule yearly.     Outpatient Encounter Medications as of 06/07/2019  Medication Sig  . albuterol (PROVENTIL HFA;VENTOLIN HFA) 108 (90 Base)  MCG/ACT inhaler Inhale 2 puffs into the lungs every 6 (six) hours as needed for wheezing or shortness of breath.  Marland Kitchen aspirin 81 MG tablet Take 81 mg by mouth daily.  Marland Kitchen atenolol (TENORMIN) 50 MG tablet Take 50 mg by mouth 2 (two) times daily.   . benzonatate (TESSALON) 100 MG capsule Take 100 mg by mouth 3 (three) times daily as needed for cough.  . calcium carbonate (OS-CAL - DOSED IN MG OF ELEMENTAL CALCIUM) 1250 MG tablet Take 1 tablet by mouth 2 (two) times daily with a meal.   . cyanocobalamin (,VITAMIN B-12,) 1000 MCG/ML injection Inject 1,000 mcg into the muscle every 30 (thirty) days.  . hydrochlorothiazide (MICROZIDE) 12.5 MG capsule Take 12.5 mg by mouth daily.  . hydroxyurea (HYDREA) 500 MG capsule Take 500 mg by mouth every other day. May take with food to minimize GI side effects. Reports alternating one day one day then two tablets the next.  . Hypromellose (ARTIFICIAL TEARS OP) Place 1 drop into both eyes as needed (for dry eyes).  Marland Kitchen levothyroxine (SYNTHROID) 25 MCG tablet Take 25 mcg by mouth daily.  . meclizine (ANTIVERT) 25 MG tablet Take 25 mg by mouth 3 (three) times daily as needed for dizziness.  . sertraline (ZOLOFT) 50 MG tablet Take 50 mg by mouth daily.  . traMADol (ULTRAM) 50 MG tablet Take 50 mg by mouth every 8 (eight) hours as needed for moderate pain.    No facility-administered encounter medications on file as of 06/07/2019.   Physical Exam: Blood pressure 122/68, pulse 67, height 5'  4" (1.626 m), weight 114 lb 3.2 oz (51.8 kg), SpO2 97 %.  General: NAD, nl appearance HE: Normocephalic, atraumatic , EOMI, Conjunctivae normal ENT: No congestion, no rhinorrhea, no exudate or erythema  Cardiovascular: Normal rate, regular rhythm.  No murmurs, rubs, or gallops Pulmonary : Effort normal, breath sounds normal. No wheezes, rales, or rhonchi Abdominal: soft, nontender,  bowel sounds present Musculoskeletal: no swelling , deformity, injury ,or tenderness in  extremities, Skin: Warm, dry , no bruising, erythema, or rash Psychiatric/Behavioral:  normal mood, normal behavior    Data Reviewed: Imaging: PET scan 14/48/1856- hypermetabolic left lower lobe pulmonary nodule with AP window lymph node, precarinal and hilar lymphadenopathy. CT chest 11/05/2016- left lower lobe spiculated nodule with mild reticular changes in the periphery. CT chest 11/16/2017- 18 to 10 mm left lower lobe nodule mildly decreased with surrounding radiation fibrosis.  No evidence of metastatic disease. CT chest 11/19/2018- 8 mm left lower lobe nodule, radiation changes in superior segment left lower lobe   PFTs: 05/24/2013-  FVC 1.72 [72%), FEV1 0.97 [45%), F/F 56, TLC 89%, DLCO 23% Moderate-severe obstruction with severe diffusion defect.  12/17/2017 FVC 2.03 [92%], FEV1 1.17 [72%), F/F 58, TLC 85%, DLCO 34% Moderate obstruction with severe diffusion defect.  Labs: CBC 11/16/2017-WBC 6.1, eos 3%, absolute eosinophil count 183 CBC 11/19/2018 - WBC 6.6 , eos 2%, absolute eosinophil count 132  Pathology Lung biopsy 02/11/2016-benign lung, with chronic inflammation, anthracosis. Lymph node biopsy of 7, 10 R-negative for malignancy.  Assessment:  Moderate COPD PFTs reviewed with moderate obstruction which is an improvement compared to previous PFT's in  2015.  There is no significant bronchodilator response. Her peripheral eosinophils are low and she likely does not require inhaled steroids.  Left lower lobe lung cancer Review CT from 11/19/2018, stable nodule. Has follow up with oncology and repeat imaging ordered for surveillance in 1 year.   Health maintenance 11/02/2017-influenza 09/29/2013-Prevnar 11/20/2001-Pneumovax Received Pfizer COVID-19 Vaccines   Plan/Recommendations: - Consult pharmacy for affordable inhaler option, LAMA/LABA - Follow up in 1 year   Tamsen Snider, MD PGY1   CC: London Pepper, MD  Attending note: I have seen and examined the  patient. History, labs and imaging reviewed. Agree with assessment and plan  Moderate COPD.  She has been prescribed LABA/LAMA but is unable to afford due to cost Make referral to pharmacy for patient assistance.  Marshell Garfinkel MD [facility] 06/08/2019, 8:44 AM

## 2019-06-16 DIAGNOSIS — D0472 Carcinoma in situ of skin of left lower limb, including hip: Secondary | ICD-10-CM | POA: Diagnosis not present

## 2019-06-16 DIAGNOSIS — D485 Neoplasm of uncertain behavior of skin: Secondary | ICD-10-CM | POA: Diagnosis not present

## 2019-06-16 DIAGNOSIS — C44622 Squamous cell carcinoma of skin of right upper limb, including shoulder: Secondary | ICD-10-CM | POA: Diagnosis not present

## 2019-06-19 NOTE — Progress Notes (Deleted)
HPI Patient presents today to [facility] to see pharmacy team for ***.  Past medical history includes ***  Number of hospitalizations in past year: Number of ***COPD/asthma exacerbations in past year:   Respiratory Medications Current: *** Tried in past: *** Patient reports {Adherence challenges yes [id number]::"[id number] challenges","no known adherence challenges"}  OBJECTIVE Allergies  Allergen Reactions  . Penicillins Hives and Rash  . Codeine Nausea And Vomiting  . Erythromycin Nausea And Vomiting  . Sulfa Drugs Cross Reactors Nausea And Vomiting    Outpatient Encounter Medications as of 06/23/2019  Medication Sig Note  . albuterol (PROVENTIL HFA;VENTOLIN HFA) 108 (90 Base) MCG/ACT inhaler Inhale 2 puffs into the lungs every 6 (six) hours as needed for wheezing or shortness of breath.   Marland Kitchen aspirin 81 MG tablet Take 81 mg by mouth daily.   Marland Kitchen atenolol (TENORMIN) 50 MG tablet Take 50 mg by mouth 2 (two) times daily.    . benzonatate (TESSALON) 100 MG capsule Take 100 mg by mouth 3 (three) times daily as needed for cough. 08/07/2016: Occasionally -has not taken any this week  . calcium carbonate (OS-CAL - DOSED IN MG OF ELEMENTAL CALCIUM) 1250 MG tablet Take 1 tablet by mouth 2 (two) times daily with a meal.    . cyanocobalamin (,VITAMIN B-12,) 1000 MCG/ML injection Inject 1,000 mcg into the muscle every 30 (thirty) days.   . hydrochlorothiazide (MICROZIDE) 12.5 MG capsule Take 12.5 mg by mouth daily.   . hydroxyurea (HYDREA) 500 MG capsule Take 500 mg by mouth every other day. May take with food to minimize GI side effects. Reports alternating one day one day then two tablets the next.   . Hypromellose (ARTIFICIAL TEARS OP) Place 1 drop into both eyes as needed (for dry eyes).   Marland Kitchen levothyroxine (SYNTHROID) 25 MCG tablet Take 25 mcg by mouth daily.   . meclizine (ANTIVERT) 25 MG tablet Take 25 mg by mouth 3 (three) times daily as needed for dizziness.   . sertraline (ZOLOFT)  50 MG tablet Take 50 mg by mouth daily.   . traMADol (ULTRAM) 50 MG tablet Take 50 mg by mouth every 8 (eight) hours as needed for moderate pain.     No facility-administered encounter medications on file as of 06/23/2019.     Immunization History  Administered Date(s) Administered  . Influenza Split 11/23/2006, 12/15/2007, 11/30/2008, 11/26/2010, 12/04/2011, 11/19/2012  . Influenza, High Dose Seasonal PF 11/02/2017  . Influenza,inj,Quad PF,6+ Mos 10/31/2015  . Influenza,inj,quad, With Preservative 09/29/2013, 10/19/2014  . Influenza-Unspecified 11/14/2015  . PFIZER SARS-COV-2 Vaccination 02/10/2019, 03/03/2019  . Pneumococcal Conjugate-13 09/29/2013  . Pneumococcal Polysaccharide-23 11/20/2001  . Td 01/04/2001  . Tdap 08/21/2010  . Zoster 06/17/2006     PFTs PFT Results Latest Ref Rng & Units 12/17/2017 05/17/2013 04/26/2013  FVC-Pre L 2.03 1.71 1.76  FVC-Predicted Pre % 92 72 73  FVC-Post L 2.00 1.72 1.88  FVC-Predicted Post % 91 72 79  Pre FEV1/FVC % % 58 56 53  Post FEV1/FCV % % 60 56 57  FEV1-Pre L 1.17 0.96 0.92  FEV1-Predicted Pre % 72 54 52  FEV1-Post L 1.20 0.97 1.08  DLCO UNC% % 34 23 42  DLCO COR %Predicted % 52 44 70  TLC L 4.54 4.52 4.85  TLC % Predicted % 89 89 95  RV % Predicted % 103 117 117     Eosinophils Most recent blood eosinophil count was *** cells/microL taken on ***.   IgE   Assessment  1. Inhaler Optimization  PIFR . Inspiratory flow measured using the In-check DIAL G16 and was in range of 30-90 L/min for use of high resistance DPI device (Handihaler). Optimal range is greater than 30 L/min.  Patient scored ***. Marland Kitchen Inspiratory flow measured using the In-check DIAL G16 and was in range of 30-90 for use of medium resistance DPI device (Pressair). Optimal range is greater than 45 L/min.  Patient scored ***. Marland Kitchen Inspiratory flow measured using the In-check DIAL G16 and was in range of 30-90 for use of medium-low resistance DPI device (Ellipta,  Diskus). Optimal range is greater than 60 L/min.  Patient scored ***. Marland Kitchen Inspiratory flow measured using the In-check DIAL G16 and was in range of 20-60 for use of pMDI device (Respimat).  A lower rate is better. Patient scored ***.  Optimal inhaler for patient would be *** considering ***.  Patient was counseled on the purpose, proper use, and adverse effects of *** inhaler.  Instructed patient to rinse mouth with water after using in order to prevent fungal infection.  Patient verbalized understanding.  Reviewed appropriate use of maintenance vs rescue inhalers.  Stressed importance of using maintenance inhaler daily and rescue inhaler only as needed.  Patient verbalized understanding.  Demonstrated proper inhaler technique using *** demo inhaler.  Patient able to demonstrate proper inhaler technique using teach back method. Patient was given sample in office today.  His inhaler was primed and able to administer first dose in office without issue.  2. Medication Reconciliation  A drug regimen assessment was performed, including review of allergies, interactions, disease-state management, dosing and immunization history. Medications were reviewed with the patient, including name, instructions, indication, goals of therapy, potential side effects, importance of adherence, and safe use.  Drug interaction(s): ***  3. Immunizations  Patient is indicated for the influenzae, pneumonia, and shingles vaccinations.  PLAN ***  All questions encouraged and answered.  Instructed patient to reach out with any further questions or concerns.  Thank you for allowing pharmacy to participate in this patient's care.  This appointment required *** minutes of patient care (this includes precharting, chart review, review of results, face-to-face care, etc.).

## 2019-06-23 ENCOUNTER — Other Ambulatory Visit: Payer: Self-pay

## 2019-06-23 ENCOUNTER — Other Ambulatory Visit: Payer: Medicare Other

## 2019-06-24 ENCOUNTER — Telehealth: Payer: Self-pay | Admitting: Pharmacist

## 2019-06-24 DIAGNOSIS — J449 Chronic obstructive pulmonary disease, unspecified: Secondary | ICD-10-CM

## 2019-06-24 NOTE — Telephone Encounter (Signed)
Ran 1 month supply test claims: Stiolto and Anoro both have $37.00 copays. Bevespi is non formulary.

## 2019-06-24 NOTE — Telephone Encounter (Signed)
Patient had an appointment yesterday with pharmacy team to discuss inhalers. When patient arrived electronic health record system was down. Patient given the option to proceed with appointment without chart information or reschedule. Discussed reason for visit which was inhaler cost. She states her current inhaler is expensive. Advised that we can research options for her plan and give her a call with our findings and reschedule if needed. Patient verbalized understanding.  Discussed options for financial assistance such as grant and patient assistance but states that she is eligible due to income. Patient stated she is currently on Breo and is out on medication. Patient given a sample while in office Breo 200 LOT LE4K and EXP 11/21.  After chart notes were available it appears patient should be on LAMA/LABA.  Will start benefits investigation for LAMA/LABA inhaler coverage.   Mariella Saa, PharmD, [place], CPP Clinical Specialty Pharmacist (Rheumatology and Pulmonology)  06/24/2019 9:25 AM

## 2019-06-28 MED ORDER — ANORO ELLIPTA 62.5-25 MCG/INH IN AEPB: 1.0000 | INHALATION_SPRAY | Freq: Every day | RESPIRATORY_TRACT | 1 refills | Status: AC

## 2019-06-28 NOTE — Telephone Encounter (Signed)
Called to notify patient.  Patient is willing to proceed with Anoro.  Prescription sent to [facility] on [address] per patient request.  Instructed patient to call with any questions or concerns.   Mariella Saa, PharmD, [place], CPP Clinical Specialty Pharmacist (Rheumatology and Pulmonology)  06/28/2019 2:06 PM

## 2019-06-29 DIAGNOSIS — C44622 Squamous cell carcinoma of skin of right upper limb, including shoulder: Secondary | ICD-10-CM | POA: Diagnosis not present

## 2019-07-04 DIAGNOSIS — L820 Inflamed seborrheic keratosis: Secondary | ICD-10-CM | POA: Diagnosis not present

## 2019-07-11 DIAGNOSIS — I10 Essential (primary) hypertension: Secondary | ICD-10-CM | POA: Diagnosis not present

## 2019-07-11 DIAGNOSIS — J449 Chronic obstructive pulmonary disease, unspecified: Secondary | ICD-10-CM | POA: Diagnosis not present

## 2019-07-11 DIAGNOSIS — E039 Hypothyroidism, unspecified: Secondary | ICD-10-CM | POA: Diagnosis not present

## 2019-07-11 DIAGNOSIS — D473 Essential (hemorrhagic) thrombocythemia: Secondary | ICD-10-CM | POA: Diagnosis not present

## 2019-07-12 DIAGNOSIS — H35371 Puckering of macula, right eye: Secondary | ICD-10-CM | POA: Diagnosis not present

## 2019-07-12 DIAGNOSIS — H04123 Dry eye syndrome of bilateral lacrimal glands: Secondary | ICD-10-CM | POA: Diagnosis not present

## 2019-07-12 DIAGNOSIS — H353132 Nonexudative age-related macular degeneration, bilateral, intermediate dry stage: Secondary | ICD-10-CM | POA: Diagnosis not present

## 2019-07-12 DIAGNOSIS — H02831 Dermatochalasis of right upper eyelid: Secondary | ICD-10-CM | POA: Diagnosis not present

## 2019-07-13 DIAGNOSIS — C44622 Squamous cell carcinoma of skin of right upper limb, including shoulder: Secondary | ICD-10-CM | POA: Diagnosis not present

## 2019-07-13 DIAGNOSIS — S81802D Unspecified open wound, left lower leg, subsequent encounter: Secondary | ICD-10-CM | POA: Diagnosis not present

## 2019-07-26 ENCOUNTER — Encounter (HOSPITAL_COMMUNITY): Payer: Self-pay | Admitting: Student

## 2019-07-26 ENCOUNTER — Emergency Department (HOSPITAL_COMMUNITY): Payer: Medicare Other

## 2019-07-26 ENCOUNTER — Inpatient Hospital Stay (HOSPITAL_COMMUNITY)
Admission: EM | Admit: 2019-07-26 | Discharge: 2019-07-29 | DRG: 291 | Disposition: A | Discharge status: Home Health | Payer: Medicare Other | Attending: Family Medicine | Admitting: Family Medicine

## 2019-07-26 DIAGNOSIS — I110 Hypertensive heart disease with heart failure: Principal | ICD-10-CM | POA: Diagnosis present

## 2019-07-26 DIAGNOSIS — E039 Hypothyroidism, unspecified: Secondary | ICD-10-CM | POA: Diagnosis not present

## 2019-07-26 DIAGNOSIS — E876 Hypokalemia: Secondary | ICD-10-CM | POA: Diagnosis present

## 2019-07-26 DIAGNOSIS — K219 Gastro-esophageal reflux disease without esophagitis: Secondary | ICD-10-CM | POA: Diagnosis present

## 2019-07-26 DIAGNOSIS — Z681 Body mass index (BMI) 19 or less, adult: Secondary | ICD-10-CM | POA: Diagnosis not present

## 2019-07-26 DIAGNOSIS — Z79899 Other long term (current) drug therapy: Secondary | ICD-10-CM | POA: Diagnosis not present

## 2019-07-26 DIAGNOSIS — D473 Essential (hemorrhagic) thrombocythemia: Secondary | ICD-10-CM | POA: Diagnosis present

## 2019-07-26 DIAGNOSIS — E43 Unspecified severe protein-calorie malnutrition: Secondary | ICD-10-CM | POA: Diagnosis present

## 2019-07-26 DIAGNOSIS — F329 Major depressive disorder, single episode, unspecified: Secondary | ICD-10-CM | POA: Diagnosis present

## 2019-07-26 DIAGNOSIS — Z7951 Long term (current) use of inhaled steroids: Secondary | ICD-10-CM | POA: Diagnosis not present

## 2019-07-26 DIAGNOSIS — Z79890 Hormone replacement therapy (postmenopausal): Secondary | ICD-10-CM | POA: Diagnosis not present

## 2019-07-26 DIAGNOSIS — J441 Chronic obstructive pulmonary disease with (acute) exacerbation: Secondary | ICD-10-CM | POA: Diagnosis not present

## 2019-07-26 DIAGNOSIS — R0689 Other abnormalities of breathing: Secondary | ICD-10-CM | POA: Diagnosis not present

## 2019-07-26 DIAGNOSIS — I2510 Atherosclerotic heart disease of native coronary artery without angina pectoris: Secondary | ICD-10-CM | POA: Diagnosis present

## 2019-07-26 DIAGNOSIS — R739 Hyperglycemia, unspecified: Secondary | ICD-10-CM | POA: Diagnosis present

## 2019-07-26 DIAGNOSIS — I5031 Acute diastolic (congestive) heart failure: Secondary | ICD-10-CM | POA: Diagnosis present

## 2019-07-26 DIAGNOSIS — Z923 Personal history of irradiation: Secondary | ICD-10-CM

## 2019-07-26 DIAGNOSIS — M810 Age-related osteoporosis without current pathological fracture: Secondary | ICD-10-CM | POA: Diagnosis present

## 2019-07-26 DIAGNOSIS — Z85118 Personal history of other malignant neoplasm of bronchus and lung: Secondary | ICD-10-CM | POA: Diagnosis not present

## 2019-07-26 DIAGNOSIS — J80 Acute respiratory distress syndrome: Secondary | ICD-10-CM | POA: Diagnosis not present

## 2019-07-26 DIAGNOSIS — R0902 Hypoxemia: Secondary | ICD-10-CM | POA: Diagnosis not present

## 2019-07-26 DIAGNOSIS — Z87891 Personal history of nicotine dependence: Secondary | ICD-10-CM | POA: Diagnosis not present

## 2019-07-26 DIAGNOSIS — I509 Heart failure, unspecified: Secondary | ICD-10-CM | POA: Diagnosis not present

## 2019-07-26 DIAGNOSIS — Z7982 Long term (current) use of aspirin: Secondary | ICD-10-CM

## 2019-07-26 DIAGNOSIS — Z20822 Contact with and (suspected) exposure to covid-19: Secondary | ICD-10-CM | POA: Diagnosis present

## 2019-07-26 DIAGNOSIS — Z85828 Personal history of other malignant neoplasm of skin: Secondary | ICD-10-CM

## 2019-07-26 DIAGNOSIS — J9601 Acute respiratory failure with hypoxia: Secondary | ICD-10-CM | POA: Diagnosis present

## 2019-07-26 DIAGNOSIS — J439 Emphysema, unspecified: Secondary | ICD-10-CM | POA: Diagnosis present

## 2019-07-26 DIAGNOSIS — N179 Acute kidney failure, unspecified: Secondary | ICD-10-CM | POA: Diagnosis not present

## 2019-07-26 DIAGNOSIS — J810 Acute pulmonary edema: Secondary | ICD-10-CM | POA: Diagnosis not present

## 2019-07-26 DIAGNOSIS — R54 Age-related physical debility: Secondary | ICD-10-CM | POA: Diagnosis present

## 2019-07-26 DIAGNOSIS — R0602 Shortness of breath: Secondary | ICD-10-CM | POA: Diagnosis not present

## 2019-07-26 DIAGNOSIS — E538 Deficiency of other specified B group vitamins: Secondary | ICD-10-CM | POA: Diagnosis present

## 2019-07-26 LAB — CBC WITH DIFFERENTIAL/PLATELET
Abs Immature Granulocytes: 0.08 10*3/uL — ABNORMAL HIGH (ref 0.00–0.07)
Basophils Absolute: 0 10*3/uL (ref 0.0–0.1)
Basophils Relative: 0 %
Eosinophils Absolute: 0.4 10*3/uL (ref 0.0–0.5)
Eosinophils Relative: 5 %
HCT: 40 % (ref 36.0–46.0)
Hemoglobin: 13.2 g/dL (ref 12.0–15.0)
Immature Granulocytes: 1 %
Lymphocytes Relative: 6 %
Lymphs Abs: 0.6 10*3/uL — ABNORMAL LOW (ref 0.7–4.0)
MCH: 37.4 pg — ABNORMAL HIGH (ref 26.0–34.0)
MCHC: 33 g/dL (ref 30.0–36.0)
MCV: 113.3 fL — ABNORMAL HIGH (ref 80.0–100.0)
Monocytes Absolute: 1.5 10*3/uL — ABNORMAL HIGH (ref 0.1–1.0)
Monocytes Relative: 17 %
Neutro Abs: 6.4 10*3/uL (ref 1.7–7.7)
Neutrophils Relative %: 71 %
Platelets: 569 10*3/uL — ABNORMAL HIGH (ref 150–400)
RBC: 3.53 MIL/uL — ABNORMAL LOW (ref 3.87–5.11)
RDW: 14.9 % (ref 11.5–15.5)
WBC: 9 10*3/uL (ref 4.0–10.5)
nRBC: 0 % (ref 0.0–0.2)

## 2019-07-26 LAB — BASIC METABOLIC PANEL
Anion gap: 10 (ref 5–15)
BUN: 20 mg/dL (ref 8–23)
CO2: 26 mmol/L (ref 22–32)
Calcium: 8.7 mg/dL — ABNORMAL LOW (ref 8.9–10.3)
Chloride: 101 mmol/L (ref 98–111)
Creatinine, Ser: 0.62 mg/dL (ref 0.44–1.00)
GFR calc Af Amer: >60 mL/min (ref >60)
GFR calc non Af Amer: >60 mL/min (ref >60)
Glucose, Bld: 106 mg/dL — ABNORMAL HIGH (ref 70–99)
Potassium: 4.5 mmol/L (ref 3.5–5.1)
Sodium: 137 mmol/L (ref 135–145)

## 2019-07-26 LAB — SARS CORONAVIRUS 2 BY RT PCR (HOSPITAL ORDER, PERFORMED IN ~~LOC~~ HOSPITAL LAB): SARS Coronavirus 2: NEGATIVE

## 2019-07-26 LAB — BRAIN NATRIURETIC PEPTIDE: B Natriuretic Peptide: 350.8 pg/mL — ABNORMAL HIGH (ref 0.0–100.0)

## 2019-07-26 MED ORDER — ALBUTEROL SULFATE (2.5 MG/3ML) 0.083% IN NEBU: 5.0000 mg | INHALATION_SOLUTION | Freq: Once | RESPIRATORY_TRACT | Status: AC

## 2019-07-26 MED ORDER — ALBUTEROL (5 MG/ML) CONTINUOUS INHALATION SOLN
10.0000 mg/h | INHALATION_SOLUTION | Freq: Once | RESPIRATORY_TRACT | Status: DC
  Filled 2019-07-26: qty 20

## 2019-07-26 MED ORDER — ALBUTEROL SULFATE (2.5 MG/3ML) 0.083% IN NEBU
INHALATION_SOLUTION | RESPIRATORY_TRACT | Status: AC
  Administered 2019-07-26: 5 mg via RESPIRATORY_TRACT
  Filled 2019-07-26: qty 6

## 2019-07-26 MED ORDER — FUROSEMIDE 10 MG/ML IJ SOLN
20.0000 mg | Freq: Once | INTRAMUSCULAR | Status: AC
  Administered 2019-07-26: 20 mg via INTRAVENOUS
  Filled 2019-07-26: qty 4

## 2019-07-26 MED ORDER — IPRATROPIUM BROMIDE 0.02 % IN SOLN: 0.5000 mg | Freq: Once | RESPIRATORY_TRACT | Status: AC

## 2019-07-26 MED ORDER — IPRATROPIUM BROMIDE 0.02 % IN SOLN
RESPIRATORY_TRACT | Status: AC
  Administered 2019-07-26: 0.5 mg via RESPIRATORY_TRACT
  Filled 2019-07-26: qty 2.5

## 2019-07-26 MED ORDER — METHYLPREDNISOLONE SODIUM SUCC 125 MG IJ SOLR
125.0000 mg | Freq: Once | INTRAMUSCULAR | Status: AC
  Administered 2019-07-26: 125 mg via INTRAVENOUS
  Filled 2019-07-26: qty 2

## 2019-07-26 NOTE — ED Notes (Signed)
Pt o2 saturation at 87-90% upon nurse assessment. Pt changed into gown and o2 decreased to 84%. Pt does not want to ambulate at this time. 2L applied [place] with o2 increasing to 96%.

## 2019-07-26 NOTE — H&P (Signed)
History and Physical   [facility] -  @ [facility] Admission History and Physical [employer], D.O.    Patient Name: Kathy Pierce MR#: [id number] Date of Birth: August 14, 1925 Date of Admission: 07/26/2019  Referring MD/NP/PA: Dr. Kathrynn Humble Primary Care Physician: London Pepper, MD  Chief Complaint:  Chief Complaint  Patient presents with  . Shortness of Breath    HPI: Kathy Pierce is a 86 y.o. female with a known history of COPD, lung cancer, hypertension, essential thrombocytosis, depression, osteoporosis nonhealing left leg wound status post resection of squamous cell carcinoma presents to the emergency department for evaluation of shortness of breath.  Patient was in a usual state of health until 2 days ago when she developed shortness of breath which has been progressively worsening.  She also reports mild lower extremity edema.  MS found her to be satting at 90% on room air which improved to 99% with 3 L.. She is feeling significantly better now after treatment in the ER.   Patient denies fevers/chills, weakness, dizziness, chest pain, N/V/C/D, abdominal pain, dysuria/frequency, changes in mental status.    Otherwise there has been no change in status. Patient has been taking medication as prescribed and there has been no recent change in medication or diet.  No recent antibiotics.  There has been no recent illness, hospitalizations, travel or sick contacts.    EMS/ED Course: Patient received Lasix, DuoNeb, Solu-Medrol,.  Patient was found to have interstitial edema on chest x-ray and an elevated BNP. She put out 300cc urine after [facility] [facility] admission has been requested for further management of new onset of congestive heart failure.  Patient has no known cardiac history and does not follow with a cardiologist  Review of Systems:  CONSTITUTIONAL: No fever/chills, fatigue, weakness, weight gain/loss, headache. EYES: No blurry or double vision. ENT: No tinnitus,  postnasal drip, redness or soreness of the oropharynx. RESPIRATORY: Positive shortness of breath and dyspnea on exertion.   No hemoptysis.  CARDIOVASCULAR: No chest pain, palpitations, syncope, orthopnea. No lower extremity edema.  GASTROINTESTINAL: No nausea, vomiting, abdominal pain, diarrhea, constipation.  No hematemesis, melena or hematochezia. GENITOURINARY: No dysuria, frequency, hematuria. ENDOCRINE: No polyuria or nocturia. No heat or cold intolerance. HEMATOLOGY: No anemia, bruising, bleeding. INTEGUMENTARY: No rashes, ulcers, lesions. MUSCULOSKELETAL: No arthritis, gout. NEUROLOGIC: No numbness, tingling, ataxia, seizure-type activity, weakness. PSYCHIATRIC: No anxiety, depression, insomnia.   Past Medical History:  Diagnosis Date  . Cancer (HCC)    Squamous cell carcinoma, skin, basal cell carcinoma  . Complication of anesthesia    wakes up slowly   . COPD (chronic obstructive pulmonary disease) ([place])   . Depression   . Essential thrombocythemia ([place])   . Hypertension   . Lung cancer ([place]) dx'd 2017  . Osteoporosis   . Sciatica of left side 06/16/2012  . Spinal stenosis of lumbar region 06/16/2012  . Vitamin B12 deficiency     Past Surgical History:  Procedure Laterality Date  . APPENDECTOMY    . CATARACT EXTRACTION, BILATERAL Bilateral   . STAPEDECTOMY Right   . TONSILLECTOMY    . VIDEO BRONCHOSCOPY WITH ENDOBRONCHIAL NAVIGATION N/A 02/11/2016   Procedure: VIDEO BRONCHOSCOPY WITH ENDOBRONCHIAL NAVIGATION;  Surgeon: Grace Isaac, MD;  Location: [facility];  Service: Thoracic;  Laterality: N/A;  . VIDEO BRONCHOSCOPY WITH ENDOBRONCHIAL ULTRASOUND N/A 02/11/2016   Procedure: VIDEO BRONCHOSCOPY WITH ENDOBRONCHIAL ULTRASOUND with biopsy of nodes #7 and #10;  Surgeon: Grace Isaac, MD;  Location: [facility];  Service: Thoracic;  Laterality:  N/A;     reports that she quit smoking about 43 years ago. Her smoking use included cigarettes. She has a 21.00 pack-year smoking  history. She has never used smokeless tobacco. She reports that she does not drink alcohol and does not use drugs.  Allergies  Allergen Reactions  . Penicillins Hives and Rash    Did it involve swelling of the face/tongue/throat, SOB, or low BP? N Did it involve sudden or severe rash/hives, skin peeling, or any reaction on the inside of your mouth or nose? Y Did you need to seek medical attention at a hospital or doctor's office? N When did it last happen?Over 88 Years Ago If all above answers are "NO", may proceed with cephalosporin use.    . Codeine Nausea And Vomiting  . Erythromycin Nausea And Vomiting  . Sulfa Drugs Cross Reactors Nausea And Vomiting    Family History  Problem Relation Age of Onset  . Heart attack Father   . Cancer Mother   . Cancer Sister   . Cancer Brother   . Cancer Brother   . Cancer Daughter     Prior to Admission medications   Medication Sig Start Date End Date Taking? Authorizing Provider  albuterol (PROVENTIL HFA;VENTOLIN HFA) 108 (90 Base) MCG/ACT inhaler Inhale 2 puffs into the lungs every 6 (six) hours as needed for wheezing or shortness of breath.   Yes [provider]  aspirin 81 MG tablet Take 81 mg by mouth daily.   Yes [provider]  atenolol (TENORMIN) 25 MG tablet Take 25 mg by mouth daily.  07/26/19  Yes [provider]  calcium carbonate (OS-CAL - DOSED IN MG OF ELEMENTAL CALCIUM) 1250 MG tablet Take 1 tablet by mouth 2 (two) times daily with a meal.    Yes [provider]  cyanocobalamin (,VITAMIN B-12,) 1000 MCG/ML injection Inject 1,000 mcg into the muscle every 30 (thirty) days.   Yes [provider]  hydrochlorothiazide (MICROZIDE) 12.5 MG capsule Take 12.5 mg by mouth daily. 04/30/12  Yes [provider]  hydroxyurea (HYDREA) 500 MG capsule Take 500 mg by mouth every other day. May take with food to minimize GI side effects. Reports alternating one day one day then two tablets  the next.   Yes [provider]  Hypromellose (ARTIFICIAL TEARS OP) Place 1 drop into both eyes as needed (for dry eyes).   Yes [provider]  levothyroxine (SYNTHROID) 25 MCG tablet Take 25 mcg by mouth daily. 08/24/18  Yes [provider]  meclizine (ANTIVERT) 25 MG tablet Take 25 mg by mouth 3 (three) times daily as needed for dizziness.   Yes [provider]  Multiple Vitamins-Minerals (PRESERVISION AREDS PO) Take 1 tablet by mouth daily.   Yes [provider]  sertraline (ZOLOFT) 100 MG tablet Take 100 mg by mouth daily.  07/26/19  Yes [provider]  traMADol (ULTRAM) 50 MG tablet Take 50 mg by mouth every 8 (eight) hours as needed for moderate pain.  07/25/15  Yes [provider]  umeclidinium-vilanterol (ANORO ELLIPTA) 62.5-25 MCG/INH AEPB Inhale 1 puff into the lungs daily. 06/28/19  Yes Mannam, Praveen, MD  atenolol (TENORMIN) 50 MG tablet Take 50 mg by mouth 2 (two) times daily.  Patient not taking: Reported on 07/26/2019    [provider]  benzonatate (TESSALON) 100 MG capsule Take 100 mg by mouth 3 (three) times daily as needed for cough. Patient not taking: Reported on 07/26/2019    [provider]  omeprazole (PRILOSEC) 20 MG capsule Take 20 mg by mouth every morning. Patient not taking: Reported on 07/26/2019 07/11/19   [provider]    Physical Exam: Vitals:   07/26/19 2140 07/26/19 2152 07/26/19 2215 07/26/19 2245  BP: (!) 121/94  (!) 119/98 129/73  Pulse: 77  82 83  Resp: 20  20 (!) 21  Temp:      SpO2: 100% 100% 95% 95%    GENERAL: 86 y.o.-year-old white female patient, thin, frail, lying in the bed in no acute distress.  Pleasant and cooperative.  HEENT: Head atraumatic, normocephalic. Pupils equal. Mucus membranes moist. NECK: Supple. No JVD. CHEST: Mild bibasilar crackles. No wheezing, rales, rhonchi or crackles. No use of accessory muscles of respiration.   CARDIOVASCULAR: S1,  S2 normal. No murmurs, rubs, or gallops. Cap refill <2 seconds. Pulses intact distally.  ABDOMEN: Soft, nondistended, nontender. No rebound, guarding, rigidity. Normoactive bowel sounds present in all four quadrants.  EXTREMITIES: Trace bilateral pedal edema, no cyanosis, or clubbing. No calf tenderness or Homan's sign.  NEUROLOGIC: The patient is alert and oriented x 3. Cranial nerves II through XII are grossly intact with no focal sensorimotor deficit. PSYCHIATRIC:  Normal affect, mood, thought content. SKIN: Warm, dry, and intact without obvious rash, lesion, or ulcer.    Labs on Admission:  CBC: Recent Labs  Lab 07/26/19 2027  WBC 9.0  NEUTROABS 6.4  HGB 13.2  HCT 40.0  MCV 113.3*  PLT 390*   Basic Metabolic Panel: Recent Labs  Lab 07/26/19 2027  NA 137  K 4.5  CL 101  CO2 26  GLUCOSE 106*  BUN 20  CREATININE 0.62  CALCIUM 8.7*   GFR: CrCl cannot be calculated (Unknown ideal weight.). Liver Function Tests: No results for input(s): AST, ALT, ALKPHOS, BILITOT, PROT, ALBUMIN in the last 168 hours. No results for input(s): LIPASE, AMYLASE in the last 168 hours. No results for input(s): AMMONIA in the last 168 hours. Coagulation Profile: No results for input(s): INR, PROTIME in the last 168 hours. Cardiac Enzymes: No results for input(s): CKTOTAL, CKMB, CKMBINDEX, TROPONINI in the last 168 hours. BNP (last 3 results) No results for input(s): PROBNP in the last 8760 hours. HbA1C: No results for input(s): HGBA1C in the last 72 hours. CBG: No results for input(s): GLUCAP in the last 168 hours. Lipid Profile: No results for input(s): CHOL, HDL, LDLCALC, TRIG, CHOLHDL, LDLDIRECT in the last 72 hours. Thyroid Function Tests: No results for input(s): TSH, T4TOTAL, FREET4, T3FREE, THYROIDAB in the last 72 hours. Anemia Panel: No results for input(s): VITAMINB12, FOLATE, FERRITIN, TIBC, IRON, RETICCTPCT in the last 72 hours. Urine analysis: No results found for:  COLORURINE, APPEARANCEUR, LABSPEC, PHURINE, GLUCOSEU, HGBUR, BILIRUBINUR, KETONESUR, PROTEINUR, UROBILINOGEN, NITRITE, LEUKOCYTESUR Sepsis Labs: @LABRCNTIP (procalcitonin:4,lacticidven:4) ) Recent Results (from the past 240 hour(s))  SARS Coronavirus 2 by RT PCR (hospital order, performed in [facility] hospital lab) Nasopharyngeal Nasopharyngeal Swab     Status: None   Collection Time: 07/26/19  8:27 PM   Specimen: Nasopharyngeal Swab  Result Value Ref Range Status   SARS Coronavirus 2 NEGATIVE NEGATIVE Final    Comment: (NOTE) SARS-CoV-2 target nucleic acids are NOT DETECTED.  The SARS-CoV-2 RNA is generally detectable in upper and lower respiratory specimens during the acute phase of infection. The lowest concentration of SARS-CoV-2 viral copies this assay can detect is 250 copies / mL. A negative result does not preclude SARS-CoV-2 infection and should not be used as the sole basis for  treatment or other patient management decisions.  A negative result may occur with improper specimen collection / handling, submission of specimen other than nasopharyngeal swab, presence of viral mutation(s) within the areas targeted by this assay, and inadequate number of viral copies (<250 copies / mL). A negative result must be combined with clinical observations, patient history, and epidemiological information.  Fact Sheet for Patients:   StrictlyIdeas.no  Fact Sheet for Healthcare Providers: BankingDealers.co.za  This test is not yet approved or  cleared by the [place] FDA and has been authorized for detection and/or diagnosis of SARS-CoV-2 by FDA under an Emergency Use Authorization (EUA).  This EUA will remain in effect (meaning this test can be used) for the duration of the COVID-19 declaration under Section 564(b)(1) of the Act, 21 U.S.C. section 360bbb-3(b)(1), unless the authorization is terminated or revoked sooner.  Performed  at [facility], [place] [address] [place], [place] [address]      Radiological Exams on Admission: DG Chest 2 View  Result Date: 07/26/2019 CLINICAL DATA:  Cough, increasing shortness of breath for 2 days, COPD EXAM: CHEST - 2 VIEW COMPARISON:  04/22/2019 FINDINGS: Frontal and lateral views of the chest demonstrate a stable cardiac silhouette. Chronic scarring is seen along the left major fissure. There is extensive background scarring and hyperinflation compatible with emphysema. Since the prior exam, central vascular congestion and diffuse interstitial prominence have developed, consistent with pulmonary edema. No effusion or pneumothorax. No acute bony abnormalities. IMPRESSION: 1. Interstitial edema superimposed upon background emphysema. Electronically Signed   By: Randa Ngo M.D.   On: 07/26/2019 20:51    EKG: Normal sinus rhythm at 77 bpm with PVCs and nonspecific ST-T wave changes.   Assessment/Plan  This is a 86 y.o. female with a history of COPD, lung cancer, hypertension, hypothyroidism, GERD, essential thrombocytosis, depression, osteoporosis nonhealing left leg wound status post resection of squamous cell carcinoma now being admitted with:  #. New onset of congestive heart failure - Admit inpatient with telemetry monitoring and continuous pulse ox - Beta blocker already ordered, add ACE and diuretic.  Nitro as needed - Intake/output, daily weight. - Trend troponins, check lipids and TSH. - Check echo - Cardiology consultation to be called in a.m.  #.  COPD with possible exacerbation -O2, nebs as needed -Continue Anoro Ellipta  #.  History of hypertension -Continue atenolol -Hold HCTZ  #. History of essential thrombocytosis - Continue hydroxyurea  #. History of depression - Continue Zoloft  #. History of GERD - Continue Prilosec  #. History of osteoporosis - Continue calcium  #. History of thyroidism - Continue Synthroid  Admission  status: Inpatient telemetry IV Fluids: Hep-Lock Diet/Nutrition: Heart healthy Consults called: Cardiology to be called in a.m., PT eval DVT Px: Lovenox, SCDs and early ambulation. Code Status: Full Code  Disposition Plan: To home in 1-2 days  All the records are reviewed and case discussed with ED provider. Management plans discussed with the patient and/or family who express understanding and agree with plan of care.  Jeraline Marcinek D.O. on 07/26/2019 at 11:53 PM CC: Primary care physician; London Pepper, MD   07/26/2019, 11:53 PM

## 2019-07-26 NOTE — ED Notes (Signed)
Patient arrives via [facility] c/o SOB x2 days.  Per EMS, lungs clear, patient took duoneb about 1 hr prior to their arrival.  Patient 90% on room air, EMS placed on 3L acute O2 via [place] with improvement in oxygen sats to 99%.  Patient was told by her primary MD they she would possibly need oxygen chronically in the future.  Patient has a non-healing wound on her left legs from having a skin cancer removed.  Vitals with EMS were 98.8, 70HR, 120/70BP, 30respirations, and CBG 115.  12 lead was unremarkable per EMS.

## 2019-07-27 ENCOUNTER — Encounter (HOSPITAL_COMMUNITY): Payer: Self-pay | Admitting: Family Medicine

## 2019-07-27 ENCOUNTER — Other Ambulatory Visit: Payer: Self-pay

## 2019-07-27 ENCOUNTER — Inpatient Hospital Stay (HOSPITAL_COMMUNITY): Payer: Medicare Other

## 2019-07-27 DIAGNOSIS — I509 Heart failure, unspecified: Secondary | ICD-10-CM

## 2019-07-27 DIAGNOSIS — J9601 Acute respiratory failure with hypoxia: Secondary | ICD-10-CM

## 2019-07-27 LAB — CREATININE, SERUM
Creatinine, Ser: 0.61 mg/dL (ref 0.44–1.00)
GFR calc Af Amer: >60 mL/min (ref >60)
GFR calc non Af Amer: >60 mL/min (ref >60)

## 2019-07-27 LAB — CBC
HCT: 39.2 % (ref 36.0–46.0)
Hemoglobin: 13.1 g/dL (ref 12.0–15.0)
MCH: 37.2 pg — ABNORMAL HIGH (ref 26.0–34.0)
MCHC: 33.4 g/dL (ref 30.0–36.0)
MCV: 111.4 fL — ABNORMAL HIGH (ref 80.0–100.0)
Platelets: 606 10*3/uL — ABNORMAL HIGH (ref 150–400)
RBC: 3.52 MIL/uL — ABNORMAL LOW (ref 3.87–5.11)
RDW: 14.6 % (ref 11.5–15.5)
WBC: 7.7 10*3/uL (ref 4.0–10.5)
nRBC: 0 % (ref 0.0–0.2)

## 2019-07-27 LAB — BASIC METABOLIC PANEL
Anion gap: 10 (ref 5–15)
BUN: 18 mg/dL (ref 8–23)
CO2: 29 mmol/L (ref 22–32)
Calcium: 8.5 mg/dL — ABNORMAL LOW (ref 8.9–10.3)
Chloride: 97 mmol/L — ABNORMAL LOW (ref 98–111)
Creatinine, Ser: 0.65 mg/dL (ref 0.44–1.00)
GFR calc Af Amer: >60 mL/min (ref >60)
GFR calc non Af Amer: >60 mL/min (ref >60)
Glucose, Bld: 179 mg/dL — ABNORMAL HIGH (ref 70–99)
Potassium: 3 mmol/L — ABNORMAL LOW (ref 3.5–5.1)
Sodium: 136 mmol/L (ref 135–145)

## 2019-07-27 LAB — TSH: TSH: 1.929 u[IU]/mL (ref 0.350–4.500)

## 2019-07-27 LAB — LIPID PANEL
Cholesterol: 122 mg/dL (ref 0–200)
HDL: 43 mg/dL (ref >40)
LDL Cholesterol: 72 mg/dL (ref 0–99)
Total CHOL/HDL Ratio: 2.8 RATIO
Triglycerides: 35 mg/dL (ref <150)
VLDL: 7 mg/dL (ref 0–40)

## 2019-07-27 LAB — TROPONIN I (HIGH SENSITIVITY)
Troponin I (High Sensitivity): 171 ng/L (ref <18)
Troponin I (High Sensitivity): 124 ng/L (ref <18)
Troponin I (High Sensitivity): 120 ng/L (ref <18)

## 2019-07-27 LAB — D-DIMER, QUANTITATIVE: D-Dimer, Quant: 1.89 ug/mL-FEU — ABNORMAL HIGH (ref 0.00–0.50)

## 2019-07-27 LAB — ECHOCARDIOGRAM COMPLETE
Height: 65 in
Weight: 1728 oz

## 2019-07-27 MED ORDER — ONDANSETRON HCL 4 MG/2ML IJ SOLN: 4.0000 mg | Freq: Four times a day (QID) | INTRAMUSCULAR | Status: DC | PRN

## 2019-07-27 MED ORDER — MECLIZINE HCL 25 MG PO TABS: 25.0000 mg | ORAL_TABLET | Freq: Three times a day (TID) | ORAL | Status: DC | PRN

## 2019-07-27 MED ORDER — SODIUM CHLORIDE 0.9% FLUSH
3.0000 mL | INTRAVENOUS | Status: DC | PRN
  Administered 2019-07-27: 3 mL via INTRAVENOUS

## 2019-07-27 MED ORDER — SERTRALINE HCL 100 MG PO TABS
100.0000 mg | ORAL_TABLET | Freq: Every day | ORAL | Status: DC
  Administered 2019-07-27 – 2019-07-29 (×3): 100 mg via ORAL
  Filled 2019-07-27 (×3): qty 1

## 2019-07-27 MED ORDER — LISINOPRIL 5 MG PO TABS
2.5000 mg | ORAL_TABLET | Freq: Every day | ORAL | Status: DC
  Administered 2019-07-27: 2.5 mg via ORAL
  Filled 2019-07-27: qty 1

## 2019-07-27 MED ORDER — TRAMADOL HCL 50 MG PO TABS: 50.0000 mg | ORAL_TABLET | Freq: Three times a day (TID) | ORAL | Status: DC | PRN

## 2019-07-27 MED ORDER — FUROSEMIDE 10 MG/ML IJ SOLN
20.0000 mg | Freq: Every day | INTRAMUSCULAR | Status: DC
  Administered 2019-07-27: 20 mg via INTRAVENOUS
  Filled 2019-07-27: qty 2

## 2019-07-27 MED ORDER — SODIUM CHLORIDE 0.9 % IV SOLN: 250.0000 mL | INTRAVENOUS | Status: DC | PRN

## 2019-07-27 MED ORDER — POTASSIUM CHLORIDE CRYS ER 20 MEQ PO TBCR
40.0000 meq | EXTENDED_RELEASE_TABLET | Freq: Every day | ORAL | Status: DC
  Administered 2019-07-27 – 2019-07-29 (×3): 40 meq via ORAL
  Filled 2019-07-27 (×3): qty 2

## 2019-07-27 MED ORDER — ACETAMINOPHEN 325 MG PO TABS: 650.0000 mg | ORAL_TABLET | ORAL | Status: DC | PRN

## 2019-07-27 MED ORDER — ASPIRIN EC 81 MG PO TBEC
81.0000 mg | DELAYED_RELEASE_TABLET | Freq: Every day | ORAL | Status: DC
  Administered 2019-07-27 – 2019-07-29 (×3): 81 mg via ORAL
  Filled 2019-07-27 (×3): qty 1

## 2019-07-27 MED ORDER — ENOXAPARIN SODIUM 40 MG/0.4ML ~~LOC~~ SOLN
40.0000 mg | SUBCUTANEOUS | Status: DC
  Administered 2019-07-27 – 2019-07-29 (×3): 40 mg via SUBCUTANEOUS
  Filled 2019-07-27 (×3): qty 0.4

## 2019-07-27 MED ORDER — HYDROXYUREA 500 MG PO CAPS
500.0000 mg | ORAL_CAPSULE | ORAL | Status: DC
  Administered 2019-07-28: 500 mg via ORAL
  Filled 2019-07-27 (×3): qty 1

## 2019-07-27 MED ORDER — IOHEXOL 350 MG/ML SOLN
100.0000 mL | Freq: Once | INTRAVENOUS | Status: AC | PRN
  Administered 2019-07-27: 80 mL via INTRAVENOUS

## 2019-07-27 MED ORDER — LEVOTHYROXINE SODIUM 25 MCG PO TABS
25.0000 ug | ORAL_TABLET | Freq: Every day | ORAL | Status: DC
  Administered 2019-07-27 – 2019-07-29 (×3): 25 ug via ORAL
  Filled 2019-07-27 (×3): qty 1

## 2019-07-27 MED ORDER — ATENOLOL 25 MG PO TABS
25.0000 mg | ORAL_TABLET | Freq: Every day | ORAL | Status: DC
  Administered 2019-07-27 – 2019-07-29 (×3): 25 mg via ORAL
  Filled 2019-07-27 (×3): qty 1

## 2019-07-27 MED ORDER — SODIUM CHLORIDE (PF) 0.9 % IJ SOLN
INTRAMUSCULAR | Status: AC
  Filled 2019-07-27: qty 50

## 2019-07-27 MED ORDER — SODIUM CHLORIDE 0.9% FLUSH
3.0000 mL | Freq: Two times a day (BID) | INTRAVENOUS | Status: DC
  Administered 2019-07-27 – 2019-07-29 (×4): 3 mL via INTRAVENOUS

## 2019-07-27 MED ORDER — IPRATROPIUM-ALBUTEROL 0.5-2.5 (3) MG/3ML IN SOLN: 3.0000 mL | Freq: Four times a day (QID) | RESPIRATORY_TRACT | Status: DC | PRN

## 2019-07-27 MED ORDER — LEVOTHYROXINE SODIUM 25 MCG PO TABS: 25.0000 ug | ORAL_TABLET | Freq: Every day | ORAL | Status: DC

## 2019-07-27 MED ORDER — UMECLIDINIUM-VILANTEROL 62.5-25 MCG/INH IN AEPB
1.0000 | INHALATION_SPRAY | Freq: Every day | RESPIRATORY_TRACT | Status: DC
  Administered 2019-07-27 – 2019-07-29 (×3): 1 via RESPIRATORY_TRACT
  Filled 2019-07-27: qty 14

## 2019-07-27 MED ORDER — POLYVINYL ALCOHOL 1.4 % OP SOLN
1.0000 [drp] | OPHTHALMIC | Status: DC | PRN
  Filled 2019-07-27: qty 15

## 2019-07-27 NOTE — TOC Initial Note (Signed)
Transition of Care [facility]) - Initial/Assessment Note    Patient Details  Name: Kathy Pierce MRN: [id number] Date of Birth: 11-21-1925  Transition of Care [facility]) CM/SW Contact:    Dessa Phi, RN Phone Number: 07/27/2019, 2:31 PM  Clinical Narrative:Patient/dtr in rm discussed d/c plan-d/c plan home.agree to [facility] chosen HHPT/aide rep Cindie following. Has rw.Will check cost of anoro/ellipta. On 02 will monitor if needed @ home.Has own transport home.                   Expected Discharge Plan: [place] Barriers to Discharge: Continued Medical Work up   Patient Goals and CMS Choice Patient states their goals for this hospitalization and ongoing recovery are:: go home CMS Medicare.gov Compare Post Acute Care list provided to:: Patient Represenative (must comment) Choice offered to / list presented to : Adult Children  Expected Discharge Plan and Services Expected Discharge Plan: [place]   Discharge Planning Services: CM Consult Post Acute Care Choice: [place] arrangements for the past 2 months: Single Family Home                           HH Arranged: PT, Nurse's Aide HH Agency: [place] Date [facility] Agency Contacted: 07/27/19 Time HH Agency Contacted: 8 Representative spoke with at [place]: [place] Arrangements/Services Living arrangements for the past 2 months: [place] with:: Self Patient language and need for interpreter reviewed:: Yes Do you feel safe going back to the place where you live?: Yes      Need for Family Participation in Patient Care: No (Comment) Care giver support system in place?: Yes (comment) Current home services: DME (rw,cane, handicapped equipped, cameras for safety) Criminal Activity/Legal Involvement Pertinent to Current Situation/Hospitalization: No - Comment as needed  Activities of Daily Living Home Assistive Devices/Equipment:  Environmental consultant (specify type), Cane (specify quad or straight) ADL Screening (condition at time of admission) Patient's cognitive ability adequate to safely complete daily activities?: Yes Is the patient deaf or have difficulty hearing?: Yes Does the patient have difficulty seeing, even when wearing glasses/contacts?: No Does the patient have difficulty concentrating, remembering, or making decisions?: No Patient able to express need for assistance with ADLs?: Yes Does the patient have difficulty dressing or bathing?: No Independently performs ADLs?: No Communication: Independent Is this a change from baseline?: Pre-admission baseline Dressing (OT): Needs assistance Is this a change from baseline?: Change from baseline, expected to last <3days Grooming: Needs assistance Is this a change from baseline?: Change from baseline, expected to last <3 days Feeding: Independent Bathing: Needs assistance Is this a change from baseline?: Change from baseline, expected to last <3 days Toileting: Needs assistance Is this a change from baseline?: Change from baseline, expected to last <3 days In/Out Bed: Needs assistance Is this a change from baseline?: Change from baseline, expected to last <3 days Walks in Home: Needs assistance Is this a change from baseline?: Change from baseline, expected to last <3 days Does the patient have difficulty walking or climbing stairs?: No Weakness of Legs: None Weakness of Arms/Hands: None  Permission Sought/Granted Permission sought to share information with : Case Manager Permission granted to share information with : Yes, Verbal Permission Granted  Share Information with NAME: Case manager  Permission granted to share info w AGENCY: [facility]  Permission granted to share info w Relationship: dtr Yolande Jolly [phone]  Emotional Assessment Appearance:: Appears stated age Attitude/Demeanor/Rapport: Gracious Affect (typically observed): Accepting Orientation:  : Oriented to Self, Oriented to Place, Oriented to  Time, Oriented to Situation Alcohol / Substance Use: Not Applicable Psych Involvement: No (comment)  Admission diagnosis:  Congestive heart failure (CHF) (HCC) [I50.9] Acute hypoxemic respiratory failure (HCC) [J96.01] Patient Active Problem List   Diagnosis Date Noted  . Congestive heart failure (CHF) ([place]) 07/26/2019  . Chronic obstructive pulmonary disease ([place]) 12/18/2017  . Essential hypertension 12/18/2017  . Major depression 12/18/2017  . Osteoporosis 12/18/2017  . Vitamin B deficiency 12/18/2017  . Essential thrombocythemia ([place]) 11/18/2016  . 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 Conductive hearing loss in right ear 04/25/2015  . Cerumen impaction 04/25/2015  . Sciatica of left side 06/16/2012  . Spinal stenosis of lumbar region 06/16/2012   PCP:  London Pepper, MD Pharmacy:   [place], [place] - [address] [place] [place] [address] Phone: [phone] Fax: [phone]     Social Determinants of Health (SDOH) Interventions    Readmission Risk Interventions No flowsheet data found.

## 2019-07-27 NOTE — Plan of Care (Signed)

## 2019-07-27 NOTE — TOC Benefit Eligibility Note (Signed)
Transition of Care [facility]) Benefit Eligibility Note    Patient Details  Name: Kathy Pierce MRN: [id number] Date of Birth: 01-Jul-1925   Medication/Dose: Anora/Elkipta:Umerclidinium not on formulary, preferred Rx is Brio Ellipta  Covered?: Yes  Tier: 3 Drug  Prescription Coverage Preferred Pharmacy: local pharmacy  Spoke with Person/Company/Phone Number:: Camille/ [employer] [place] [phone]  Co-Pay: $37.00  Prior Approval: No  Deductible: Met       Kerin Salen Phone Number: 07/27/2019, 3:32 PM

## 2019-07-27 NOTE — ED Provider Notes (Signed)
[facility] Provider Note   CSN: [id number] Arrival date & time: 07/26/19  1816     History Chief Complaint  Patient presents with   Shortness of Breath    Kathy Pierce is a 85 y.o. female.  HPI     86 year old female comes in a chief complaint of shortness of breath.  She has history of COPD but does not require oxygen at home.  She has no coronary artery disease or cardiac dysfunction history.  She reports that over the past few days she has had increased shortness of breath.  The shortness of breath has worsened gradually and over the last 2 days she has been having orthopnea and paroxysmal nocturnal dyspnea-like symptoms.  With this she does not have any new cough, fevers, chills.  Patient has been having some wheezing, she has not responded well to the breathing treatments that she normally takes.  She denies any sick exposures.  She is fully vaccinated. Pt has no hx of PE, DVT and denies any exogenous hormone (testosterone / estrogen) use, long distance travels or surgery in the past 6 weeks, active cancer, recent immobilization.   Past Medical History:  Diagnosis Date   Cancer ([place])    Squamous cell carcinoma, skin, basal cell carcinoma   Complication of anesthesia    wakes up slowly    COPD (chronic obstructive pulmonary disease) ([place])    Depression    Essential thrombocythemia ([place])    Hypertension    Lung cancer ([place]) dx'd 2017   Osteoporosis    Sciatica of left side 06/16/2012   Spinal stenosis of lumbar region 06/16/2012   Vitamin B12 deficiency     Patient Active Problem List   Diagnosis Date Noted   Congestive heart failure (CHF) ([place]) 07/26/2019   Chronic obstructive pulmonary disease ([place]) 12/18/2017   Essential hypertension 12/18/2017   Major depression 12/18/2017   Osteoporosis 12/18/2017   Vitamin B deficiency 12/18/2017   Essential thrombocythemia ([place]) 11/18/2016   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[place]) 02/07/2016   Conductive hearing loss in right ear 04/25/2015   Cerumen impaction 04/25/2015   Sciatica of left side 06/16/2012   Spinal stenosis of lumbar region 06/16/2012    Past Surgical History:  Procedure Laterality Date   APPENDECTOMY     CATARACT EXTRACTION, BILATERAL Bilateral    STAPEDECTOMY Right    TONSILLECTOMY     VIDEO BRONCHOSCOPY WITH ENDOBRONCHIAL NAVIGATION N/A 02/11/2016   Procedure: VIDEO BRONCHOSCOPY WITH ENDOBRONCHIAL NAVIGATION;  Surgeon: Grace Isaac, MD;  Location: [facility];  Service: Thoracic;  Laterality: N/A;   VIDEO BRONCHOSCOPY WITH ENDOBRONCHIAL ULTRASOUND N/A 02/11/2016   Procedure: VIDEO BRONCHOSCOPY WITH ENDOBRONCHIAL ULTRASOUND with biopsy of nodes #7 and #10;  Surgeon: Grace Isaac, MD;  Location: [facility];  Service: Thoracic;  Laterality: N/A;     OB History   No obstetric history on file.     Family History  Problem Relation Age of Onset   Heart attack Father    Cancer Mother    Cancer Sister    Cancer Brother    Cancer Brother    Cancer Daughter     Social History   Tobacco Use   Smoking status: Former Smoker    Packs/day: 1.00    Years: 21.00    Pack years: 21.00    Types: Cigarettes    Quit date: 07/10/1976    Years since quitting: 43.0   Smokeless tobacco: Never  Used  Vaping Use   Vaping Use: Never used  Substance Use Topics   Alcohol use: No   Drug use: No    Home Medications Prior to Admission medications   Medication Sig Start Date End Date Taking? Authorizing Provider  albuterol (PROVENTIL HFA;VENTOLIN HFA) 108 (90 Base) MCG/ACT inhaler Inhale 2 puffs into the lungs every 6 (six) hours as needed for wheezing or shortness of breath.   Yes [provider]  aspirin 81 MG tablet Take 81 mg by mouth daily.   Yes [provider]  atenolol (TENORMIN) 25 MG tablet Take 25 mg by mouth daily.  07/26/19  Yes [provider]  calcium carbonate  (OS-CAL - DOSED IN MG OF ELEMENTAL CALCIUM) 1250 MG tablet Take 1 tablet by mouth 2 (two) times daily with a meal.    Yes [provider]  cyanocobalamin (,VITAMIN B-12,) 1000 MCG/ML injection Inject 1,000 mcg into the muscle every 30 (thirty) days.   Yes [provider]  hydrochlorothiazide (MICROZIDE) 12.5 MG capsule Take 12.5 mg by mouth daily. 04/30/12  Yes [provider]  hydroxyurea (HYDREA) 500 MG capsule Take 500 mg by mouth every other day. May take with food to minimize GI side effects. Reports alternating one day one day then two tablets the next.   Yes [provider]  Hypromellose (ARTIFICIAL TEARS OP) Place 1 drop into both eyes as needed (for dry eyes).   Yes [provider]  levothyroxine (SYNTHROID) 25 MCG tablet Take 25 mcg by mouth daily. 08/24/18  Yes [provider]  meclizine (ANTIVERT) 25 MG tablet Take 25 mg by mouth 3 (three) times daily as needed for dizziness.   Yes [provider]  Multiple Vitamins-Minerals (PRESERVISION AREDS PO) Take 1 tablet by mouth daily.   Yes [provider]  sertraline (ZOLOFT) 100 MG tablet Take 100 mg by mouth daily.  07/26/19  Yes [provider]  traMADol (ULTRAM) 50 MG tablet Take 50 mg by mouth every 8 (eight) hours as needed for moderate pain.  07/25/15  Yes [provider]  umeclidinium-vilanterol (ANORO ELLIPTA) 62.5-25 MCG/INH AEPB Inhale 1 puff into the lungs daily. 06/28/19  Yes Mannam, Praveen, MD  atenolol (TENORMIN) 50 MG tablet Take 50 mg by mouth 2 (two) times daily.  Patient not taking: Reported on 07/26/2019    [provider]  benzonatate (TESSALON) 100 MG capsule Take 100 mg by mouth 3 (three) times daily as needed for cough. Patient not taking: Reported on 07/26/2019    [provider]  omeprazole (PRILOSEC) 20 MG capsule Take 20 mg by mouth every morning. Patient not taking: Reported on 07/26/2019 07/11/19   [provider]    Allergies    Penicillins, Codeine, Erythromycin, and Sulfa drugs cross reactors  Review of Systems   Review of Systems  Constitutional: Positive for activity change.  Respiratory: Positive for shortness of breath.   Cardiovascular: Negative for chest pain.  Gastrointestinal: Negative for nausea and vomiting.  Allergic/Immunologic: Negative for immunocompromised state.  Hematological: Does not bruise/bleed easily.  All other systems reviewed and are negative.   Physical Exam Updated Vital Signs BP 119/70 (BP Location: Right Arm)    Pulse 68    Temp 97.6 F (36.4 C) (Oral)    Resp 20    Ht 5\' 5"  (1.651 m)    Wt 49 kg    SpO2 100%    BMI 17.97 kg/m   Physical Exam Vitals and nursing note reviewed.  Constitutional:      Appearance: She is well-developed.  HENT:     Head: Normocephalic and atraumatic.  Cardiovascular:     Rate and Rhythm: Normal rate.  Pulmonary:     Effort: Pulmonary effort is normal.     Breath sounds: Decreased breath sounds present. No wheezing, rhonchi or rales.  Abdominal:     General: Bowel sounds are normal.  Musculoskeletal:     Cervical back: Normal range of motion and neck supple.  Skin:    General: Skin is warm and dry.  Neurological:     Mental Status: She is alert and oriented to person, place, and time.     ED Results / Procedures / Treatments   Labs (all labs ordered are listed, but only abnormal results are displayed) Labs Reviewed  BASIC METABOLIC PANEL - Abnormal; Notable for the following components:      Result Value   Glucose, Bld 106 (*)    Calcium 8.7 (*)    All other components within normal limits  CBC WITH DIFFERENTIAL/PLATELET - Abnormal; Notable for the following components:   RBC 3.53 (*)    MCV 113.3 (*)    MCH 37.4 (*)    Platelets 569 (*)    Lymphs Abs 0.6 (*)    Monocytes Absolute 1.5 (*)    Abs Immature Granulocytes 0.08 (*)    All other components within normal limits  BRAIN NATRIURETIC  PEPTIDE - Abnormal; Notable for the following components:   B Natriuretic Peptide 350.8 (*)    All other components within normal limits  BASIC METABOLIC PANEL - Abnormal; Notable for the following components:   Potassium 3.0 (*)    Chloride 97 (*)    Glucose, Bld 179 (*)    Calcium 8.5 (*)    All other components within normal limits  CBC - Abnormal; Notable for the following components:   RBC 3.52 (*)    MCV 111.4 (*)    MCH 37.2 (*)    Platelets 606 (*)    All other components within normal limits  D-DIMER, QUANTITATIVE (NOT AT [facility]) - Abnormal; Notable for the following components:   D-Dimer, Quant 1.89 (*)    All other components within normal limits  TROPONIN I (HIGH SENSITIVITY) - Abnormal; Notable for the following components:   Troponin I (High Sensitivity) 171 (*)    All other components within normal limits  TROPONIN I (HIGH SENSITIVITY) - Abnormal; Notable for the following components:   Troponin I (High Sensitivity) 124 (*)    All other components within normal limits  TROPONIN I (HIGH SENSITIVITY) - Abnormal; Notable for the following components:   Troponin I (High Sensitivity) 120 (*)    All other components within normal limits  SARS CORONAVIRUS 2 BY RT PCR (HOSPITAL ORDER, [facility] [place] [facility])  CREATININE, SERUM  TSH  LIPID PANEL    EKG EKG Interpretation  Date/Time:  Tuesday July 26 2019 20:26:19 EDT Ventricular Rate:  77 PR Interval:    QRS Duration: 87 QT Interval:  391 QTC Calculation: 443 R Axis:   -13 Text Interpretation: Sinus rhythm Multiple premature complexes, vent & supraven Abnormal R-wave progression, early transition No acute changes No significant change since last tracing Confirmed by Varney Biles [phone]) on 07/26/2019 8:27:53 PM   Radiology DG Chest 2 View  Result Date: 07/26/2019 CLINICAL DATA:  Cough, increasing shortness of breath for 2 days, COPD EXAM: CHEST - 2 VIEW COMPARISON:  04/22/2019 FINDINGS: Frontal  and  lateral views of the chest demonstrate a stable cardiac silhouette. Chronic scarring is seen along the left major fissure. There is extensive background scarring and hyperinflation compatible with emphysema. Since the prior exam, central vascular congestion and diffuse interstitial prominence have developed, consistent with pulmonary edema. No effusion or pneumothorax. No acute bony abnormalities. IMPRESSION: 1. Interstitial edema superimposed upon background emphysema. Electronically Signed   By: Randa Ngo M.D.   On: 07/26/2019 20:51   CT ANGIO CHEST PE W OR WO CONTRAST  Result Date: 07/27/2019 CLINICAL DATA:  Shortness of breath EXAM: CT ANGIOGRAPHY CHEST WITH CONTRAST TECHNIQUE: Multidetector CT imaging of the chest was performed using the standard protocol during bolus administration of intravenous contrast. Multiplanar CT image reconstructions and MIPs were obtained to evaluate the vascular anatomy. CONTRAST:  63mL OMNIPAQUE IOHEXOL 350 MG/ML SOLN COMPARISON:  11/19/2018 FINDINGS: Cardiovascular: Satisfactory opacification of the pulmonary arteries to the segmental level. No evidence of pulmonary embolism when allowing for levels of motion artifact. Enlarged heart size. No pericardial effusion. Aortic and coronary atherosclerosis Mediastinum/Nodes: Negative for adenopathy or mass. Lungs/Pleura: Radiation fibrosis around fiducial clips in the upper left lung. Centrilobular and panlobular emphysema. Mild dependent atelectasis and trace pleural fluid. No consolidation, interlobular septal thickening, or pneumothorax. Upper Abdomen: Layering high-density in the gallbladder which was also seen on prior, with homogeneous appearance suggesting milk of calcium or microlithiasis. No acute finding. Musculoskeletal: Remote left rib fractures which may be posttraumatic or related to radiation. Remote T6 compression fracture. No acute or aggressive finding Review of the MIP images confirms the above findings.  IMPRESSION: 1. No evidence of pulmonary embolism.  Motion is a limiting factor. 2. Irradiated left lung cancer without worrisome features. 3. Cardiomegaly and trace pleural effusions. 4. Advanced emphysema. Aortic Atherosclerosis (ICD10-I70.0) and Emphysema (ICD10-J43.9). Electronically Signed   By: Monte Fantasia M.D.   On: 07/27/2019 05:58   ECHOCARDIOGRAM COMPLETE  Result Date: 07/27/2019    ECHOCARDIOGRAM REPORT   Patient Name:   AJAH VANHOOSE [facility] Date of Exam: 07/27/2019 Medical Rec #:  [id number]     Height:       65.0 in Accession #:    [id number]    Weight:       108.0 lb Date of Birth:  1925/06/10    BSA:          1.522 m Patient Age:    65 years      BP:           106/63 mmHg Patient Gender: F             HR:           84 bpm. Exam Location:  Inpatient Procedure: 2D Echo Indications:    Congestive Heart Failure I50.9  History:        Patient has prior history of Echocardiogram examinations, most                 recent 06/16/2016. COPD; Risk Factors:Hypertension.  Sonographer:    Mikki Santee RDCS (AE) Referring Phys: [id number] [place]  1. Left ventricular ejection fraction, by estimation, is 60 to 65%. The left ventricle has normal function. The left ventricle has no regional wall motion abnormalities. There is mild concentric left ventricular hypertrophy. Left ventricular diastolic parameters are consistent with Grade II diastolic dysfunction (pseudonormalization). Elevated left ventricular end-diastolic pressure.  2. Right ventricular systolic function is normal. The right ventricular size is normal. There is moderately elevated pulmonary artery systolic pressure.  3. Left  atrial size was severely dilated.  4. Right atrial size was mildly dilated.  5. The mitral valve is normal in structure. Trivial mitral valve regurgitation. No evidence of mitral stenosis.  6. The aortic valve is tricuspid. Aortic valve regurgitation is trivial. Mild aortic valve sclerosis is present, with no  evidence of aortic valve stenosis.  7. Aortic dilatation noted. There is mild dilatation of the ascending aorta measuring 38 mm.  8. The inferior vena cava is dilated in size with >50% respiratory variability, suggesting right atrial pressure of 8 mmHg. FINDINGS  Left Ventricle: Left ventricular ejection fraction, by estimation, is 60 to 65%. The left ventricle has normal function. The left ventricle has no regional wall motion abnormalities. The left ventricular internal cavity size was normal in size. There is  mild concentric left ventricular hypertrophy. Left ventricular diastolic parameters are consistent with Grade II diastolic dysfunction (pseudonormalization). Elevated left ventricular end-diastolic pressure. Right Ventricle: The right ventricular size is normal. No increase in right ventricular wall thickness. Right ventricular systolic function is normal. There is moderately elevated pulmonary artery systolic pressure. The tricuspid regurgitant velocity is 3.18 m/s, and with an assumed right atrial pressure of 8 mmHg, the estimated right ventricular systolic pressure is 49.4 mmHg. Left Atrium: Left atrial size was severely dilated. Right Atrium: Right atrial size was mildly dilated. Pericardium: There is no evidence of pericardial effusion. Mitral Valve: The mitral valve is normal in structure. Normal mobility of the mitral valve leaflets. Mild mitral annular calcification. Trivial mitral valve regurgitation. No evidence of mitral valve stenosis. Tricuspid Valve: The tricuspid valve is normal in structure. Tricuspid valve regurgitation is mild . No evidence of tricuspid stenosis. Aortic Valve: The aortic valve is tricuspid. . There is mild thickening and mild calcification of the aortic valve. Aortic valve regurgitation is trivial. Mild aortic valve sclerosis is present, with no evidence of aortic valve stenosis. There is mild thickening of the aortic valve. There is mild calcification of the aortic valve.  Pulmonic Valve: The pulmonic valve was normal in structure. Pulmonic valve regurgitation is not visualized. No evidence of pulmonic stenosis. Aorta: Aortic dilatation noted. There is mild dilatation of the ascending aorta measuring 38 mm. Venous: The inferior vena cava is dilated in size with greater than 50% respiratory variability, suggesting right atrial pressure of 8 mmHg. IAS/Shunts: No atrial level shunt detected by color flow Doppler.  LEFT VENTRICLE PLAX 2D LVIDd:         3.90 cm  Diastology LVIDs:         2.10 cm  LV e' lateral:   6.31 cm/s LV PW:         1.10 cm  LV E/e' lateral: 17.4 LV IVS:        1.20 cm  LV e' medial:    5.87 cm/s LVOT diam:     2.00 cm  LV E/e' medial:  18.7 LV SV:         69 LV SV Index:   45 LVOT Area:     3.14 cm  RIGHT VENTRICLE RV S prime:     12.40 cm/s TAPSE (M-mode): 1.1 cm LEFT ATRIUM             Index       RIGHT ATRIUM           Index LA diam:        3.60 cm 2.36 cm/m  RA Area:     18.20 cm LA Vol (A2C):   76.7 ml  50.38 ml/m RA Volume:   46.90 ml  30.81 ml/m LA Vol (A4C):   50.4 ml 33.11 ml/m LA Biplane Vol: 63.7 ml 41.84 ml/m  AORTIC VALVE LVOT Vmax:   88.60 cm/s LVOT Vmean:  68.600 cm/s LVOT VTI:    0.220 m  AORTA Ao Root diam: 3.20 cm MITRAL VALVE                TRICUSPID VALVE MV Area (PHT): 2.93 cm     TR Peak grad:   40.4 mmHg MV Decel Time: 259 msec     TR Vmax:        318.00 cm/s MV E velocity: 110.00 cm/s MV A velocity: 93.00 cm/s   SHUNTS MV E/A ratio:  1.18         Systemic VTI:  0.22 m                             Systemic Diam: 2.00 cm Skeet Latch MD Electronically signed by Skeet Latch MD Signature Date/Time: 07/27/2019/11:58:08 AM    Final     Procedures Procedures (including critical care time)  Medications Ordered in ED Medications  aspirin EC tablet 81 mg (81 mg Oral Given 07/27/19 1042)  traMADol (ULTRAM) tablet 50 mg (has no administration in time range)  hydroxyurea (HYDREA) capsule 500 mg (has no administration in time range)    atenolol (TENORMIN) tablet 25 mg (25 mg Oral Given 07/27/19 1042)  sertraline (ZOLOFT) tablet 100 mg (100 mg Oral Given 07/27/19 1041)  meclizine (ANTIVERT) tablet 25 mg (has no administration in time range)  umeclidinium-vilanterol (ANORO ELLIPTA) 62.5-25 MCG/INH 1 puff (1 puff Inhalation Given 07/27/19 0904)  polyvinyl alcohol (LIQUIFILM TEARS) 1.4 % ophthalmic solution 1 drop (has no administration in time range)  sodium chloride flush (NS) 0.9 % injection 3 mL (3 mLs Intravenous Given 07/27/19 1043)  sodium chloride flush (NS) 0.9 % injection 3 mL (3 mLs Intravenous Given 07/27/19 0510)  0.9 %  sodium chloride infusion (has no administration in time range)  acetaminophen (TYLENOL) tablet 650 mg (has no administration in time range)  ondansetron (ZOFRAN) injection 4 mg (has no administration in time range)  enoxaparin (LOVENOX) injection 40 mg (40 mg Subcutaneous Given 07/27/19 1042)  furosemide (LASIX) injection 20 mg (20 mg Intravenous Given 07/27/19 1040)  lisinopril (ZESTRIL) tablet 2.5 mg (2.5 mg Oral Given 07/27/19 1041)  ipratropium-albuterol (DUONEB) 0.5-2.5 (3) MG/3ML nebulizer solution 3 mL (has no administration in time range)  levothyroxine (SYNTHROID) tablet 25 mcg (25 mcg Oral Given 07/27/19 0559)  potassium chloride SA (KLOR-CON) CR tablet 40 mEq (40 mEq Oral Given 07/27/19 1041)  methylPREDNISolone sodium succinate (SOLU-MEDROL) 125 mg/2 mL injection 125 mg (125 mg Intravenous Given 07/26/19 2029)  albuterol (PROVENTIL) (2.5 MG/3ML) 0.083% nebulizer solution 5 mg (5 mg Nebulization Given 07/26/19 2151)  ipratropium (ATROVENT) nebulizer solution 0.5 mg (0.5 mg Nebulization Given 07/26/19 2152)  furosemide (LASIX) injection 20 mg (20 mg Intravenous Given 07/26/19 2313)  sodium chloride (PF) 0.9 % injection (  Duplicate 5/00/93 8182)  iohexol (OMNIPAQUE) 350 MG/ML injection 100 mL (80 mLs Intravenous Contrast Given 07/27/19 9937)    ED Course  I have reviewed the triage vital signs and  the nursing notes.  Pertinent labs & imaging results that were available during my care of the patient were reviewed by me and considered in my medical decision making (see chart for details).    MDM Rules/Calculators/A&P  86 year old female comes in with chief complaint of shortness of breath.  It appears that the shortness of breath has worsened over the last few days.  There has been gradual progression of her symptoms.  She is complaining of orthopnea and paroxysmal nocturnal dyspnea-like symptoms over the past 2 days.  No new leg swelling.  No history of DVT, PE and no risk factors for the same.  Patient has no systemic symptoms consistent with acute febrile illness, and she is fully vaccinated.  Initial impression was that patient likely has worsening of her COPD leading to the new hypoxia.  Patient was noted to have O2 sats in the 80s in the ER on room air, with minimal exertion.  Chest x-ray ordered and it shows interstitial edema.  Pneumonia, pleural effusion ruled out.  Pneumothorax ruled out.  Patient continues to be on 2 to 3 L of oxygen to maintain O2 sats over 92%.  Given the findings of interstitial edema on chest x-ray, suspicion is that patient might have new onset CHF or perhaps pulmonary hypertension.  BNP was ordered and it is elevated over 300.  She has received 20 mg of Lasix and will admit her for further work-up.  Final Clinical Impression(s) / ED Diagnoses Final diagnoses:  Acute hypoxemic respiratory failure ([place])  Acute pulmonary edema ([place])    Rx / DC Orders ED Discharge Orders    None       Varney Biles, MD 07/27/19 1428

## 2019-07-27 NOTE — Progress Notes (Signed)
  Echocardiogram 2D Echocardiogram has been performed.  Jennette Dubin 07/27/2019, 9:45 AM

## 2019-07-27 NOTE — Progress Notes (Signed)
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 New onset heart failure BNP 350 a. Empirically treating-had best response to Lasix b. Underlying lung disease probably from years of smoking quit finally in 1980--this may have some bearing on her recovery c. Follow-up echocardiogram that has been done today d. Needs desat screen e. Continue Lasix 20 IV daily-holding HCTZ 12.5-watch creatinine carefully as started earlier this admit on lisinopril 2.5 f. Not getting strict ins and outs and her daily weights have to be checked as standing which will be ordered her baseline weight here is 49 kg 2. Elevated troponin I 71 a. No chest pain whatsoever would not check further 3. COPD/emphysema on CT scan unable to afford LABA/LAMA a. Documented in pulmonology's prior notes-for now continue Anoro Ellipta and Tessalon b. We will discuss with the patient subsequently c. Will ask case management to look into this 4. HTN a. Continue nonselective atenolol b. Blood pressures are reasonably controlled c. Might consider discontinuation of aspirin 81 mg as outpatient with shared decision making by PCP 5. Hypokalemia likely secondary to diuresis a. Replacement x1 given K. Dur 40 b. , repeat labs a.m. with magnesium 6. Hyperglycemia not otherwise specified a. Outpatient checks 7. Thrombocytosis-essential 8. Left lower lung stereotactic  XRT 2018 on observation a. Continue hydroxyurea does need follow-up b. Outpatient follow-up 9. Depression a. Continue sertraline 100 daily 10. Moderate to severe malnutrition a. BMI 17 b. Likely secondary to frailty and advanced age-no data to suggest improvements occur with supplements 11. Reflux 12. Hypothyroidism  DVT prophylaxis:  Code Status:  Family Communication: In the patient's room Discussed with the granddaughter on the phone  disposition:   Status is: Inpatient  Remains inpatient appropriate because:Persistent severe electrolyte disturbances, Ongoing diagnostic testing needed not appropriate for outpatient work up and IV treatments appropriate due to intensity of illness or inability to take PO   Dispo: The patient is from: Home              Anticipated d/c is to: To be determined              Anticipated d/c date is: 2 days              Patient currently is not medically stable to d/c.       Consultants:     Procedures:   Antimicrobials:     Subjective: Pleasant alert feels better some surprised that in the hospital Wearing oxygen at this time no chest pain had a full meal and tolerated well No nausea no vomiting no cough no cold  Objective: Vitals:   07/27/19 0115 07/27/19 0230 07/27/19 0307 07/27/19 0441  BP: 115/61 (!) 104/59 123/60 106/63  Pulse: 78 76 74 68  Resp: (!) 26 (!) 24 19 20   Temp:   97.7 F (36.5 C) (!) 97.5 F (36.4 C)  TempSrc:   Oral Oral  SpO2: 94% 93% 100% 97%  Weight:   49 kg   Height:   5\' 5"  (  1.651 m)    No intake or output data in the 24 hours ending 07/27/19 0703 Filed Weights   07/27/19 0307  Weight: 49 kg    Examination:  General exam: Alert bitemporal wasting pleasant moderate dentition Cannot appreciate JVD Respiratory system: Slight wheeze no rales no rhonchi no egophony Cardiovascular system: S1-S2 cannot appreciate murmur Gastrointestinal system: Soft nontender no rebound no guarding. Central nervous  system: Intact no focal deficit moving all 4 limbs equally good praxis smile is symmetric Extremities: No lower extremity edema-skin on the left shin has a denuded area under a bandage that is about 0.5 cm deep and 4 cm round slightly sloughy on the floor Skin: As above Psychiatry: Euthymic congruent pleasant and alert and oriented x4  Data Reviewed: I have personally reviewed following labs and imaging studies Potassium 3.0 BUN/creatinine down from 20/0.6-->18/0.6 LDL 72 Hemoglobin 13 WBC 7 platelets 606 (baseline 300)   Radiology Studies: DG Chest 2 View  Result Date: 07/26/2019 CLINICAL DATA:  Cough, increasing shortness of breath for 2 days, COPD EXAM: CHEST - 2 VIEW COMPARISON:  04/22/2019 FINDINGS: Frontal and lateral views of the chest demonstrate a stable cardiac silhouette. Chronic scarring is seen along the left major fissure. There is extensive background scarring and hyperinflation compatible with emphysema. Since the prior exam, central vascular congestion and diffuse interstitial prominence have developed, consistent with pulmonary edema. No effusion or pneumothorax. No acute bony abnormalities. IMPRESSION: 1. Interstitial edema superimposed upon background emphysema. Electronically Signed   By: Randa Ngo M.D.   On: 07/26/2019 20:51   CT ANGIO CHEST PE W OR WO CONTRAST  Result Date: 07/27/2019 CLINICAL DATA:  Shortness of breath EXAM: CT ANGIOGRAPHY CHEST WITH CONTRAST TECHNIQUE: Multidetector CT imaging of the chest was performed using the standard protocol during bolus administration of intravenous contrast. Multiplanar CT image reconstructions and MIPs were obtained to evaluate the vascular anatomy. CONTRAST:  46mL OMNIPAQUE IOHEXOL 350 MG/ML SOLN COMPARISON:  11/19/2018 FINDINGS: Cardiovascular: Satisfactory opacification of the pulmonary arteries to the segmental level. No evidence of pulmonary embolism when allowing for levels of motion artifact. Enlarged heart size. No  pericardial effusion. Aortic and coronary atherosclerosis Mediastinum/Nodes: Negative for adenopathy or mass. Lungs/Pleura: Radiation fibrosis around fiducial clips in the upper left lung. Centrilobular and panlobular emphysema. Mild dependent atelectasis and trace pleural fluid. No consolidation, interlobular septal thickening, or pneumothorax. Upper Abdomen: Layering high-density in the gallbladder which was also seen on prior, with homogeneous appearance suggesting milk of calcium or microlithiasis. No acute finding. Musculoskeletal: Remote left rib fractures which may be posttraumatic or related to radiation. Remote T6 compression fracture. No acute or aggressive finding Review of the MIP images confirms the above findings. IMPRESSION: 1. No evidence of pulmonary embolism.  Motion is a limiting factor. 2. Irradiated left lung cancer without worrisome features. 3. Cardiomegaly and trace pleural effusions. 4. Advanced emphysema. Aortic Atherosclerosis (ICD10-I70.0) and Emphysema (ICD10-J43.9). Electronically Signed   By: Monte Fantasia M.D.   On: 07/27/2019 05:58     Scheduled Meds: . aspirin EC  81 mg Oral Daily  . atenolol  25 mg Oral Daily  . enoxaparin (LOVENOX) injection  40 mg Subcutaneous Q24H  . furosemide  20 mg Intravenous Daily  . [START ON 07/28/2019] hydroxyurea  500 mg Oral QODAY  . levothyroxine  25 mcg Oral Q0600  . lisinopril  2.5 mg Oral Daily  . sertraline  100 mg Oral Daily  . sodium chloride flush  3 mL Intravenous Q12H  . umeclidinium-vilanterol  1 puff Inhalation Daily   Continuous Infusions: . sodium chloride       LOS: 1 day    Time spent: [place], MD [facility] To contact the attending provider between 7A-7P or the covering provider during after hours 7P-7A, please log into the web site [email or web address] and access using universal [place] password for that web site. If you do not have the password, please call the hospital  operator.  07/27/2019, 7:03 AM

## 2019-07-27 NOTE — Evaluation (Addendum)
Physical Therapy Evaluation Patient Details Name: Kathy Pierce MRN: [id number] DOB: 08-21-1925 Today's Date: 07/27/2019   History of Present Illness  86 yo female admitted with CHF. Hx of COPD, lung ca, depression, osteoporosis, squamous cell cancer, spinal stenosis, non healing L leg wound  Clinical Impression  On eval, pt required Min assist for mobility. She walked ~125 feet with a RW. Pt presents with general weakness, decreased activity tolerance, and impaired gait and balance. At baseline, pt is modified independent and uses a cane for ambulation. Today, she was very unsteady and at risk for falls when mobilizing. LOB intermittently (initial standing, while toileting/performing hygiene care). Pt plans to d/c home once medically ready however she may need increased in home supervision/assistance based on today's performance. Will continue to follow and progress activity as tolerated. If pt cannot arrange for in home assistance, she may need to consider ST SNF.     Follow Up Recommendations Home health PT;Supervision/Assistance - 24 hour vs SNF ;Home Health Aide    Equipment Recommendations  Rolling Walker    Recommendations for Other Services       Precautions / Restrictions Precautions Precautions: Fall Restrictions Weight Bearing Restrictions: No      Mobility  Bed Mobility Overal bed mobility: Modified Independent                Transfers Overall transfer level: Needs assistance Equipment used: 1 person hand held assist Transfers: Sit to/from [facility] Sit to Stand: Min assist Stand pivot transfers: Min assist       General transfer comment: Assist to rise, stabilize, control descent. Posterior bias with LOB.  Ambulation/Gait Ambulation/Gait assistance: Min assist Gait Distance (Feet): 125 Feet Assistive device: Rolling walker (2 wheeled) Gait Pattern/deviations: Step-through pattern;Decreased stride length     General Gait Details:  Assist to stabilize througout distance. Cues for safety, proper use of RW. Unsteady. Pt tolerated distance well. O2 92% on RA  Stairs            Wheelchair Mobility    Modified Rankin (Stroke Patients Only)       Balance Overall balance assessment: Needs assistance         Standing balance support: Bilateral upper extremity supported Standing balance-Leahy Scale: Poor                               Pertinent Vitals/Pain Pain Assessment: No/denies pain    Home Living Family/patient expects to be discharged to:: Private residence Living Arrangements: Alone   Type of Home: House Home Access: Level entry     Home Layout: One level Home Equipment: Cane - single point;Grab bars - tub/shower;Shower seat      Prior Function Level of Independence: Needs assistance   Gait / Transfers Assistance Needed: ambulates with a cane  ADL's / Homemaking Assistance Needed: cleaning person 1x/week        Hand Dominance        Extremity/Trunk Assessment   Upper Extremity Assessment Upper Extremity Assessment: Defer to OT evaluation    Lower Extremity Assessment Lower Extremity Assessment: Generalized weakness    Cervical / Trunk Assessment Cervical / Trunk Assessment: Kyphotic  Communication   Communication: No difficulties  Cognition Arousal/Alertness: Awake/alert Behavior During Therapy: WFL for tasks assessed/performed Overall Cognitive Status: Within Functional Limits for tasks assessed  General Comments      Exercises     Assessment/Plan    PT Assessment Patient needs continued PT services  PT Problem List Decreased strength;Decreased mobility;Decreased activity tolerance;Decreased balance;Decreased knowledge of use of DME;Decreased skin integrity       PT Treatment Interventions DME instruction;Gait training;Therapeutic activities;Therapeutic exercise;Patient/family  education;Balance training;Functional mobility training    PT Goals (Current goals can be found in the Care Plan section)  Acute Rehab PT Goals Patient Stated Goal: home PT Goal Formulation: With patient Time For Goal Achievement: 08/10/19 Potential to Achieve Goals: Good    Frequency Min 3X/week   Barriers to discharge        Co-evaluation               AM-PAC PT "6 Clicks" Mobility  Outcome Measure Help needed turning from your back to your side while in a flat bed without using bedrails?: None Help needed moving from lying on your back to sitting on the side of a flat bed without using bedrails?: None Help needed moving to and from a bed to a chair (including a wheelchair)?: A Little Help needed standing up from a chair using your arms (e.g., wheelchair or bedside chair)?: A Little Help needed to walk in hospital room?: A Little Help needed climbing 3-5 steps with a railing? : A Little 6 Click Score: 20    End of Session Equipment Utilized During Treatment: Gait belt Activity Tolerance: Patient tolerated treatment well Patient left: in chair;with call bell/phone within reach;with chair alarm set   PT Visit Diagnosis: Unsteadiness on feet (R26.81);Muscle weakness (generalized) (M62.81)    Time: 5498-2641 PT Time Calculation (min) (ACUTE ONLY): 31 min   Charges:   PT Evaluation $PT Eval Low Complexity: 1 Low PT Treatments $Gait Training: 8-22 mins          Doreatha Massed, PT Acute Rehabilitation  Office: [phone] Pager: [phone]

## 2019-07-27 NOTE — Progress Notes (Signed)
Physical Therapy Treatment Patient Details Name: Kathy Pierce MRN: [id number] DOB: 1925-12-09 Today's Date: 07/27/2019    History of Present Illness 86 yo female admitted with CHF. Hx of COPD, lung ca, depression, osteoporosis, squamous cell cancer, spinal stenosis, non healing L leg wound    PT Comments    Assisted pt to bathroom (she called out but no one was available to assist). After toileting, she was agreeable to ambulate a 2nd time in the hallway-this time with a cane. Pt remains unsteady-required intermittent assist to prevent LOB/fall. Especially unsteady with head turns/scanning environment. O2 88% on RA during ambulation this afternoon. Will continue to follow. Recommend RW use for safe ambulation until pt returns to baseline.     Follow Up Recommendations  Home health PT;Supervision/Assistance - 24 hour; Home Health Aide     Equipment Recommendations   (per chart, already has RW)    Recommendations for Other Services       Precautions / Restrictions Precautions Precautions: Fall Precaution Comments: monitor O2 Restrictions Weight Bearing Restrictions: No    Mobility  Bed Mobility              General bed mobility comments: oob in recliner  Transfers Overall transfer level: Needs assistance Equipment used: None Transfers: Sit to/from Stand Sit to Stand: Supervision        General transfer comment: for safety.  Ambulation/Gait Ambulation/Gait assistance: Min guard;Min assist Gait Distance (Feet): 175 Feet Assistive device: Straight cane Gait Pattern/deviations: Step-through pattern;Decreased stride length     General Gait Details: Pt remains unsteady requiring intermittent assist to steady. O2 88% on RA, dyspnea 2/4.   Stairs             Wheelchair Mobility    Modified Rankin (Stroke Patients Only)       Balance Overall balance assessment: Needs assistance Sitting-balance support: Feet supported Sitting balance-Leahy Scale:  Good     Standing balance support: No upper extremity supported Standing balance-Leahy Scale: Fair Standing balance comment: close S for safety                            Cognition Arousal/Alertness: Awake/alert Behavior During Therapy: WFL for tasks assessed/performed Overall Cognitive Status: Within Functional Limits for tasks assessed                                        Exercises      General Comments General comments (skin integrity, edema, etc.): O2 maintained between 96-99% on room air with functional ambulation in room      Pertinent Vitals/Pain Pain Assessment: No/denies pain    Home Living Family/patient expects to be discharged to:: Private residence Living Arrangements: Alone Available Help at Discharge: Family;Friend(s);Available PRN/intermittently Type of Home: House Home Access: Level entry   Home Layout: One level Home Equipment: Cane - single point;Grab bars - tub/shower;Shower seat;Walker - 2 wheels      Prior Function Level of Independence: Needs assistance  Gait / Transfers Assistance Needed: ambulates with a cane ADL's / Homemaking Assistance Needed: cleaning person 1x/week, otherwise I with self care     PT Goals (current goals can now be found in the care plan section) Acute Rehab PT Goals Patient Stated Goal: home PT Goal Formulation: With patient Time For Goal Achievement: 08/10/19 Potential to Achieve Goals: Good Progress towards PT goals: Progressing toward  goals    Frequency    Min 3X/week      PT Plan Current plan remains appropriate    Co-evaluation              AM-PAC PT "6 Clicks" Mobility   Outcome Measure  Help needed turning from your back to your side while in a flat bed without using bedrails?: None Help needed moving from lying on your back to sitting on the side of a flat bed without using bedrails?: None Help needed moving to and from a bed to a chair (including a wheelchair)?:  A Little Help needed standing up from a chair using your arms (e.g., wheelchair or bedside chair)?: A Little Help needed to walk in hospital room?: A Little Help needed climbing 3-5 steps with a railing? : A Little 6 Click Score: 20    End of Session Equipment Utilized During Treatment: Gait belt Activity Tolerance: Patient tolerated treatment well Patient left: in chair;with call bell/phone within reach;with chair alarm set   PT Visit Diagnosis: Unsteadiness on feet (R26.81);Muscle weakness (generalized) (M62.81)     Time: 6384-5364 PT Time Calculation (min) (ACUTE ONLY): 24 min  Charges:  $Gait Training: 8-22 mins $Therapeutic Activity: 8-22 mins                         Doreatha Massed, PT Acute Rehabilitation  Office: [phone] Pager: [phone]

## 2019-07-27 NOTE — Progress Notes (Signed)
Patient was transported to Radiology for a CT Angio (chest). Patient tolerated the move well.

## 2019-07-27 NOTE — Progress Notes (Signed)
CRITICAL VALUE ALERT  Critical Value:  Troponin (High Sensitivity) 171  Date & Time Notied:  07/27/19;0334  Provider Notified: yes  Orders Received/Actions taken: No new orders at this time

## 2019-07-27 NOTE — Evaluation (Signed)
Occupational Therapy Evaluation Patient Details Name: Kathy Pierce MRN: [id number] DOB: 1925/09/03 Today's Date: 07/27/2019    History of Present Illness 86 yo female admitted with CHF. Hx of COPD, lung ca, depression, osteoporosis, squamous cell cancer, spinal stenosis, non healing L leg wound   Clinical Impression   Patient seated in chair upon arrival, agreeable to OT. Patient demo doff/don socks seated in chair without assist. Supervision with transfer from recliner, reports feeling a little unsteady "I have been sitting so much!" Supervision for functional ambulation in patient's room without AD for safety, O2 maintained between 96-99% on room air. Patient's DTR reports there are 3 camera's in patient's home to "make sure she's kicking around," and has grown grandchildren as well as friends from church that check in. Will continue to follow.     Follow Up Recommendations  No OT follow up    Equipment Recommendations  None recommended by OT (pt has necessary DME)       Precautions / Restrictions Precautions Precautions: Fall Restrictions Weight Bearing Restrictions: No      Mobility Bed Mobility Overal bed mobility: Modified Independent             General bed mobility comments: in recliner upon arrival  Transfers Overall transfer level: Needs assistance Equipment used: None Transfers: Sit to/from Stand Sit to Stand: Supervision Stand pivot transfers: Min assist       General transfer comment: no physical assist to rise from chair, supervision for safety as patient states "I've been sitting so much" no loss of balance noted    Balance Overall balance assessment: Needs assistance Sitting-balance support: Feet supported Sitting balance-Leahy Scale: Good     Standing balance support: No upper extremity supported Standing balance-Leahy Scale: Fair Standing balance comment: close S for safety                           ADL either performed or  assessed with clinical judgement   ADL Overall ADL's : Needs assistance/impaired     Grooming: Supervision/safety;Standing   Upper Body Bathing: Set up;Sitting   Lower Body Bathing: Supervison/ safety;Sit to/from stand   Upper Body Dressing : Set up;Sitting   Lower Body Dressing: Supervision/safety;Sit to/from stand Lower Body Dressing Details (indicate cue type and reason): able to doff/don socks seated in recliner Toilet Transfer: Supervision/safety;Ambulation Toilet Transfer Details (indicate cue type and reason): for safety, ambulate in room without AD Toileting- Clothing Manipulation and Hygiene: Supervision/safety;Sit to/from stand       Functional mobility during ADLs: Supervision/safety                    Pertinent Vitals/Pain Pain Assessment: No/denies pain     Hand Dominance Right   Extremity/Trunk Assessment Upper Extremity Assessment Upper Extremity Assessment: Overall WFL for tasks assessed   Lower Extremity Assessment Lower Extremity Assessment: Defer to PT evaluation   Cervical / Trunk Assessment Cervical / Trunk Assessment: Kyphotic   Communication Communication Communication: No difficulties   Cognition Arousal/Alertness: Awake/alert Behavior During Therapy: WFL for tasks assessed/performed Overall Cognitive Status: Within Functional Limits for tasks assessed                                     General Comments  O2 maintained between 96-99% on room air with functional ambulation in room  Home Living Family/patient expects to be discharged to:: Private residence Living Arrangements: Alone Available Help at Discharge: Family;Friend(s);Available PRN/intermittently Type of Home: House Home Access: Level entry     Home Layout: One level     Bathroom Shower/Tub: Teacher, early years/pre: Handicapped height     Home Equipment: Cane - single point;Grab bars - tub/shower;Shower seat;Walker - 2  wheels          Prior Functioning/Environment Level of Independence: Needs assistance  Gait / Transfers Assistance Needed: ambulates with a cane ADL's / Homemaking Assistance Needed: cleaning person 1x/week, otherwise I with self care            OT Problem List: Decreased activity tolerance;Impaired balance (sitting and/or standing)      OT Treatment/Interventions: Self-care/ADL training;Therapeutic exercise;Energy conservation;DME and/or AE instruction;Therapeutic activities;Balance training;Patient/family education    OT Goals(Current goals can be found in the care plan section) Acute Rehab OT Goals Patient Stated Goal: home OT Goal Formulation: With patient/family Time For Goal Achievement: 08/10/19 Potential to Achieve Goals: Good  OT Frequency: Min 2X/week    AM-PAC OT "6 Clicks" Daily Activity     Outcome Measure Help from another person eating meals?: None Help from another person taking care of personal grooming?: A Little Help from another person toileting, which includes using toliet, bedpan, or urinal?: A Little Help from another person bathing (including washing, rinsing, drying)?: A Little Help from another person to put on and taking off regular upper body clothing?: A Little Help from another person to put on and taking off regular lower body clothing?: A Little 6 Click Score: 19   End of Session Nurse Communication: Mobility status  Activity Tolerance: Patient tolerated treatment well Patient left: in chair;with call bell/phone within reach;with chair alarm set  OT Visit Diagnosis: Other abnormalities of gait and mobility (R26.89)                Time: 4098-1191 OT Time Calculation (min): 19 min Charges:  OT General Charges $OT Visit: 1 Visit OT Evaluation $OT Eval Moderate Complexity: 1 Mod  Delbert Phenix OT Pager: [place] 07/27/2019, 2:41 PM

## 2019-07-28 LAB — CBC WITH DIFFERENTIAL/PLATELET
Abs Immature Granulocytes: 0.15 10*3/uL — ABNORMAL HIGH (ref 0.00–0.07)
Basophils Absolute: 0 10*3/uL (ref 0.0–0.1)
Basophils Relative: 0 %
Eosinophils Absolute: 0.4 10*3/uL (ref 0.0–0.5)
Eosinophils Relative: 3 %
HCT: 39.7 % (ref 36.0–46.0)
Hemoglobin: 13 g/dL (ref 12.0–15.0)
Immature Granulocytes: 1 %
Lymphocytes Relative: 7 %
Lymphs Abs: 0.8 10*3/uL (ref 0.7–4.0)
MCH: 37.1 pg — ABNORMAL HIGH (ref 26.0–34.0)
MCHC: 32.7 g/dL (ref 30.0–36.0)
MCV: 113.4 fL — ABNORMAL HIGH (ref 80.0–100.0)
Monocytes Absolute: 1.5 10*3/uL — ABNORMAL HIGH (ref 0.1–1.0)
Monocytes Relative: 14 %
Neutro Abs: 7.9 10*3/uL — ABNORMAL HIGH (ref 1.7–7.7)
Neutrophils Relative %: 75 %
Platelets: 744 10*3/uL — ABNORMAL HIGH (ref 150–400)
RBC: 3.5 MIL/uL — ABNORMAL LOW (ref 3.87–5.11)
RDW: 14.9 % (ref 11.5–15.5)
WBC: 10.7 10*3/uL — ABNORMAL HIGH (ref 4.0–10.5)
nRBC: 0 % (ref 0.0–0.2)

## 2019-07-28 LAB — COMPREHENSIVE METABOLIC PANEL
ALT: 14 U/L (ref 0–44)
AST: 22 U/L (ref 15–41)
Albumin: 3 g/dL — ABNORMAL LOW (ref 3.5–5.0)
Alkaline Phosphatase: 52 U/L (ref 38–126)
Anion gap: 13 (ref 5–15)
BUN: 35 mg/dL — ABNORMAL HIGH (ref 8–23)
CO2: 27 mmol/L (ref 22–32)
Calcium: 9 mg/dL (ref 8.9–10.3)
Chloride: 99 mmol/L (ref 98–111)
Creatinine, Ser: 0.9 mg/dL (ref 0.44–1.00)
GFR calc Af Amer: >60 mL/min (ref >60)
GFR calc non Af Amer: 56 mL/min — ABNORMAL LOW (ref >60)
Glucose, Bld: 100 mg/dL — ABNORMAL HIGH (ref 70–99)
Potassium: 4 mmol/L (ref 3.5–5.1)
Sodium: 139 mmol/L (ref 135–145)
Total Bilirubin: 0.8 mg/dL (ref 0.3–1.2)
Total Protein: 6.2 g/dL — ABNORMAL LOW (ref 6.5–8.1)

## 2019-07-28 MED ORDER — FUROSEMIDE 20 MG PO TABS
20.0000 mg | ORAL_TABLET | Freq: Every day | ORAL | Status: DC
  Administered 2019-07-28 – 2019-07-29 (×2): 20 mg via ORAL
  Filled 2019-07-28 (×2): qty 1

## 2019-07-28 NOTE — Progress Notes (Signed)
SATURATION QUALIFICATIONS: (This note is used to comply with regulatory documentation for home oxygen)  Patient Saturations on Room Air at Rest = 95%  Patient Saturations on Room Air while Ambulating = 85%  Patient Saturations on 2 Liters of oxygen while Ambulating = 96%  Please briefly explain why patient needs home oxygen: Pt O2 drops to 85% with ambulation on room air.

## 2019-07-28 NOTE — Progress Notes (Signed)
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 New onset heart failure BNP 350 a. Empirically treating-had best response to Lasix b. Underlying lung disease probably from years of smoking quit finally in 1980--this may have some bearing on her recovery c. Follow-up echocardiogram that has been done today d. IV Lasix changed to p.o. 20 Lasix-strict I/O e. Weight 49-->48 kg-urine not be accurately documented 2. Elevated troponin I 71 a. No chest pain whatsoever would not check further 3. AKI a. Abrupt rise in creatinine-discontinued lisinopril b. Adjusted Lasix 7/1 c. Recheck labs a.m. hold fluids 4. COPD/emphysema on CT scan unable to afford LABA/LAMA a. Documented in pulmonology's prior notes-for now continue Anoro Ellipta and Tessalon b. We will discuss with the patient subsequently c. Will ask case management to look into this 5. HTN a. Continue nonselective atenolol b. Blood pressures are reasonably controlled c. Might consider discontinuation of aspirin 81 mg as outpatient with shared decision making by PCP 6. Hypokalemia likely secondary to diuresis a. Replacement x1 given K. Dur 40 b. Potassium is better 7. Hyperglycemia not otherwise specified a. Outpatient checks b. Would not aggressively control given her advanced age and risk of complications with aggressive control in the [place] subset of  population 8. Thrombocytosis-essential 9. Left lower lung stereotactic XRT 2018 on observation a. Continue hydroxyurea does need follow-up b. Outpatient follow-up 10. Depression a. Continue sertraline 100 daily 11. Moderate to severe malnutrition a. BMI 17 b. Likely secondary to frailty and advanced age-no data to suggest improvements occur with supplements 12. Reflux 13. Hypothyroidism  DVT prophylaxis:  Code Status:  Family Communication:   disposition:   Status is: Inpatient  Remains inpatient appropriate because:Persistent severe electrolyte disturbances, Ongoing diagnostic testing needed not appropriate for outpatient work up and IV treatments appropriate due to intensity of illness or inability to take PO   Dispo: The patient is from: Home              Anticipated d/c is to: To be determined              Anticipated d/c date is: 1 day              Patient currently is not medically stable to d/c.       Consultants:     Procedures:   Antimicrobials:     Subjective: Sitting up at the bedside alert coherent States that her flanks felt "sore" over the past 24 hours and she has been passing a lot of urine Otherwise she feels great and has no chest pain no other issues  Objective: Vitals:   07/28/19 0446 07/28/19 0500 07/28/19 0810 07/28/19 0812  BP: (!) 113/58     Pulse: 67     Resp: 18     Temp: 98.1 F (36.7 C)     TempSrc:      SpO2: (!) 89%  91% 91%  Weight:  48.3 kg    Height:        Intake/Output Summary (  Last 24 hours) at 07/28/2019 0941 Last data filed at 07/27/2019 2049 Gross per 24 hour  Intake 3 ml  Output --  Net 3 ml   Filed Weights   07/27/19 0307 07/28/19 0500  Weight: 49 kg 48.3 kg    Examination:  General exam: Alert bitemporal wasting pleasant moderate dentition Cannot appreciate JVD Respiratory system: No wheeze rales rhonchi Cardiovascular system: S1-S2 cannot appreciate murmur-on monitors some PVCs early this  morning Gastrointestinal system: Soft nontender no rebound no guarding. Central nervous system: Intact no focal deficit and no changes from prior Extremities: Wound on lower extremity not examined today Skin: As above Psychiatry: Euthymic congruent pleasant and alert and oriented x4  Data Reviewed: I have personally reviewed following labs and imaging studies Potassium 3.0-->4.0 BUN/creatinine down from 20/0.6-->18/0.6-->35/0.9 WBC 10.7, platelets 744 LDL 72    Radiology Studies: DG Chest 2 View  Result Date: 07/26/2019 CLINICAL DATA:  Cough, increasing shortness of breath for 2 days, COPD EXAM: CHEST - 2 VIEW COMPARISON:  04/22/2019 FINDINGS: Frontal and lateral views of the chest demonstrate a stable cardiac silhouette. Chronic scarring is seen along the left major fissure. There is extensive background scarring and hyperinflation compatible with emphysema. Since the prior exam, central vascular congestion and diffuse interstitial prominence have developed, consistent with pulmonary edema. No effusion or pneumothorax. No acute bony abnormalities. IMPRESSION: 1. Interstitial edema superimposed upon background emphysema. Electronically Signed   By: Randa Ngo M.D.   On: 07/26/2019 20:51   CT ANGIO CHEST PE W OR WO CONTRAST  Result Date: 07/27/2019 CLINICAL DATA:  Shortness of breath EXAM: CT ANGIOGRAPHY CHEST WITH CONTRAST TECHNIQUE: Multidetector CT imaging of the chest was performed using the standard protocol during bolus administration of intravenous contrast. Multiplanar CT image reconstructions and MIPs were obtained to evaluate the vascular anatomy. CONTRAST:  68mL OMNIPAQUE IOHEXOL 350 MG/ML SOLN COMPARISON:  11/19/2018 FINDINGS: Cardiovascular: Satisfactory opacification of the pulmonary arteries to the segmental level. No evidence of pulmonary embolism when allowing for levels of motion artifact. Enlarged heart size. No pericardial effusion. Aortic and coronary atherosclerosis  Mediastinum/Nodes: Negative for adenopathy or mass. Lungs/Pleura: Radiation fibrosis around fiducial clips in the upper left lung. Centrilobular and panlobular emphysema. Mild dependent atelectasis and trace pleural fluid. No consolidation, interlobular septal thickening, or pneumothorax. Upper Abdomen: Layering high-density in the gallbladder which was also seen on prior, with homogeneous appearance suggesting milk of calcium or microlithiasis. No acute finding. Musculoskeletal: Remote left rib fractures which may be posttraumatic or related to radiation. Remote T6 compression fracture. No acute or aggressive finding Review of the MIP images confirms the above findings. IMPRESSION: 1. No evidence of pulmonary embolism.  Motion is a limiting factor. 2. Irradiated left lung cancer without worrisome features. 3. Cardiomegaly and trace pleural effusions. 4. Advanced emphysema. Aortic Atherosclerosis (ICD10-I70.0) and Emphysema (ICD10-J43.9). Electronically Signed   By: Monte Fantasia M.D.   On: 07/27/2019 05:58   ECHOCARDIOGRAM COMPLETE  Result Date: 07/27/2019    ECHOCARDIOGRAM REPORT   Patient Name:   Kathy Pierce [facility] Date of Exam: 07/27/2019 Medical Rec #:  [id number]     Height:       65.0 in Accession #:    [id number]    Weight:       108.0 lb Date of Birth:  03/02/1925    BSA:          1.522 m Patient Age:    43 years      BP:  106/63 mmHg Patient Gender: F             HR:           84 bpm. Exam Location:  Inpatient Procedure: 2D Echo Indications:    Congestive Heart Failure I50.9  History:        Patient has prior history of Echocardiogram examinations, most                 recent 06/16/2016. COPD; Risk Factors:Hypertension.  Sonographer:    Mikki Santee RDCS (AE) Referring Phys: [id number] [place]  1. Left ventricular ejection fraction, by estimation, is 60 to 65%. The left ventricle has normal function. The left ventricle has no regional wall motion abnormalities. There is mild  concentric left ventricular hypertrophy. Left ventricular diastolic parameters are consistent with Grade II diastolic dysfunction (pseudonormalization). Elevated left ventricular end-diastolic pressure.  2. Right ventricular systolic function is normal. The right ventricular size is normal. There is moderately elevated pulmonary artery systolic pressure.  3. Left atrial size was severely dilated.  4. Right atrial size was mildly dilated.  5. The mitral valve is normal in structure. Trivial mitral valve regurgitation. No evidence of mitral stenosis.  6. The aortic valve is tricuspid. Aortic valve regurgitation is trivial. Mild aortic valve sclerosis is present, with no evidence of aortic valve stenosis.  7. Aortic dilatation noted. There is mild dilatation of the ascending aorta measuring 38 mm.  8. The inferior vena cava is dilated in size with >50% respiratory variability, suggesting right atrial pressure of 8 mmHg. FINDINGS  Left Ventricle: Left ventricular ejection fraction, by estimation, is 60 to 65%. The left ventricle has normal function. The left ventricle has no regional wall motion abnormalities. The left ventricular internal cavity size was normal in size. There is  mild concentric left ventricular hypertrophy. Left ventricular diastolic parameters are consistent with Grade II diastolic dysfunction (pseudonormalization). Elevated left ventricular end-diastolic pressure. Right Ventricle: The right ventricular size is normal. No increase in right ventricular wall thickness. Right ventricular systolic function is normal. There is moderately elevated pulmonary artery systolic pressure. The tricuspid regurgitant velocity is 3.18 m/s, and with an assumed right atrial pressure of 8 mmHg, the estimated right ventricular systolic pressure is 99.2 mmHg. Left Atrium: Left atrial size was severely dilated. Right Atrium: Right atrial size was mildly dilated. Pericardium: There is no evidence of pericardial effusion.  Mitral Valve: The mitral valve is normal in structure. Normal mobility of the mitral valve leaflets. Mild mitral annular calcification. Trivial mitral valve regurgitation. No evidence of mitral valve stenosis. Tricuspid Valve: The tricuspid valve is normal in structure. Tricuspid valve regurgitation is mild . No evidence of tricuspid stenosis. Aortic Valve: The aortic valve is tricuspid. . There is mild thickening and mild calcification of the aortic valve. Aortic valve regurgitation is trivial. Mild aortic valve sclerosis is present, with no evidence of aortic valve stenosis. There is mild thickening of the aortic valve. There is mild calcification of the aortic valve. Pulmonic Valve: The pulmonic valve was normal in structure. Pulmonic valve regurgitation is not visualized. No evidence of pulmonic stenosis. Aorta: Aortic dilatation noted. There is mild dilatation of the ascending aorta measuring 38 mm. Venous: The inferior vena cava is dilated in size with greater than 50% respiratory variability, suggesting right atrial pressure of 8 mmHg. IAS/Shunts: No atrial level shunt detected by color flow Doppler.  LEFT VENTRICLE PLAX 2D LVIDd:         3.90 cm  Diastology  LVIDs:         2.10 cm  LV e' lateral:   6.31 cm/s LV PW:         1.10 cm  LV E/e' lateral: 17.4 LV IVS:        1.20 cm  LV e' medial:    5.87 cm/s LVOT diam:     2.00 cm  LV E/e' medial:  18.7 LV SV:         69 LV SV Index:   45 LVOT Area:     3.14 cm  RIGHT VENTRICLE RV S prime:     12.40 cm/s TAPSE (M-mode): 1.1 cm LEFT ATRIUM             Index       RIGHT ATRIUM           Index LA diam:        3.60 cm 2.36 cm/m  RA Area:     18.20 cm LA Vol (A2C):   76.7 ml 50.38 ml/m RA Volume:   46.90 ml  30.81 ml/m LA Vol (A4C):   50.4 ml 33.11 ml/m LA Biplane Vol: 63.7 ml 41.84 ml/m  AORTIC VALVE LVOT Vmax:   88.60 cm/s LVOT Vmean:  68.600 cm/s LVOT VTI:    0.220 m  AORTA Ao Root diam: 3.20 cm MITRAL VALVE                TRICUSPID VALVE MV Area (PHT): 2.93  cm     TR Peak grad:   40.4 mmHg MV Decel Time: 259 msec     TR Vmax:        318.00 cm/s MV E velocity: 110.00 cm/s MV A velocity: 93.00 cm/s   SHUNTS MV E/A ratio:  1.18         Systemic VTI:  0.22 m                             Systemic Diam: 2.00 cm Skeet Latch MD Electronically signed by Skeet Latch MD Signature Date/Time: 07/27/2019/11:58:08 AM    Final      Scheduled Meds: . aspirin EC  81 mg Oral Daily  . atenolol  25 mg Oral Daily  . enoxaparin (LOVENOX) injection  40 mg Subcutaneous Q24H  . furosemide  20 mg Oral Daily  . hydroxyurea  500 mg Oral QODAY  . levothyroxine  25 mcg Oral Q0600  . potassium chloride  40 mEq Oral Daily  . sertraline  100 mg Oral Daily  . sodium chloride flush  3 mL Intravenous Q12H  . umeclidinium-vilanterol  1 puff Inhalation Daily   Continuous Infusions: . sodium chloride       LOS: 2 days    Time spent: [place], MD [facility] To contact the attending provider between 7A-7P or the covering provider during after hours 7P-7A, please log into the web site [email or web address] and access using universal [place] password for that web site. If you do not have the password, please call the hospital operator.  07/28/2019, 9:41 AM

## 2019-07-28 NOTE — Progress Notes (Signed)
Physical Therapy Treatment Patient Details Name: Kathy Pierce MRN: [id number] DOB: May 24, 1925 Today's Date: 07/28/2019    History of Present Illness 86 yo female admitted with CHF. Hx of COPD, lung ca, depression, osteoporosis, squamous cell cancer, spinal stenosis, non healing L leg wound    PT Comments    Pt sitting in recliner on arrival.  Pt ambulated in hallway and monitored Spo2 which dropped to 87% on room air.  Pt requiring supplemental oxygen for physical activity tasks at this time (documented by RN in progress notes).  Pt had questions about need for oxygen so RN and PT discussed this with her.  Pt agreeable to HHPT at this time.    Follow Up Recommendations  Home health PT;Supervision/Assistance - 24 hour     Equipment Recommendations  None recommended by PT    Recommendations for Other Services       Precautions / Restrictions Precautions Precautions: Fall Precaution Comments: monitor O2    Mobility  Bed Mobility               General bed mobility comments: oob in recliner  Transfers Overall transfer level: Needs assistance Equipment used: None Transfers: Sit to/from Stand Sit to Stand: Supervision         General transfer comment: for safety.  Ambulation/Gait Ambulation/Gait assistance: Min guard;Min assist Gait Distance (Feet): 400 Feet Assistive device: 1 person hand held assist Gait Pattern/deviations: Step-through pattern;Decreased stride length     General Gait Details: pt typically uses cane so provided HHA, pt does not like assist so utilized gait belt for safety, mild assist with unsteadiness for any challenges such as head turns, pt required supplemental oxygen (RN present as well and documented)   Marine scientist [place] (Stroke Patients Only)       Balance                                            Cognition Arousal/Alertness: Awake/alert Behavior During  Therapy: WFL for tasks assessed/performed Overall Cognitive Status: Within Functional Limits for tasks assessed                                        Exercises      General Comments        Pertinent Vitals/Pain Pain Assessment: No/denies pain    Home Living                      Prior Function            PT Goals (current goals can now be found in the care plan section) Progress towards PT goals: Progressing toward goals    Frequency    Min 3X/week      PT Plan Current plan remains appropriate    Co-evaluation              AM-PAC PT "6 Clicks" Mobility   Outcome Measure  Help needed turning from your back to your side while in a flat bed without using bedrails?: None Help needed moving from lying on your back to sitting on the side of a flat bed without using bedrails?: None Help needed moving to and from  a bed to a chair (including a wheelchair)?: A Little Help needed standing up from a chair using your arms (e.g., wheelchair or bedside chair)?: A Little Help needed to walk in hospital room?: A Little Help needed climbing 3-5 steps with a railing? : A Little 6 Click Score: 20    End of Session Equipment Utilized During Treatment: Gait belt Activity Tolerance: Patient tolerated treatment well Patient left: in chair;with call bell/phone within reach;with chair alarm set;with family/visitor present   PT Visit Diagnosis: Unsteadiness on feet (R26.81);Muscle weakness (generalized) (M62.81)     Time: 7034-0352 PT Time Calculation (min) (ACUTE ONLY): 19 min  Charges:  $Gait Training: 8-22 mins                    Arlyce Dice, DPT Acute Rehabilitation Services Pager: [phone] Office: [phone]  [place] Ram E 07/28/2019, 3:11 PM

## 2019-07-28 NOTE — TOC Progression Note (Addendum)
Transition of Care [facility]) - Progression Note    Patient Details  Name: Kathy Pierce MRN: [id number] Date of Birth: [birth year]  Transition of Care [facility]) CM/SW Contact  Shade Flood, LCSW Phone Number: 07/28/2019, 2:04 PM  Clinical Narrative:     TOC following. Received call from pt's daughter requesting follow up regarding dc planning. Met with pt and dtr in pt's room to update. Plan remains for return to home at dc, likely tomorrow, with [facility] [address] and Aide to follow. Will provide resource list for private duty agencies to pt's daughter tomorrow before dc at her request.   Will follow.  1435: Updated by RN that pt will need Home O2 at dc. Started referral. Anticipating dc tomorrow. Will follow.   Expected Discharge Plan: [place] Barriers to Discharge: Continued Medical Work up  Expected Discharge Plan and Services Expected Discharge Plan: [place]   Discharge Planning Services: CM Consult Post Acute Care Choice: [place] arrangements for the past 2 months: Single Family Home                           HH Arranged: PT, Nurse's Aide HH Agency: [place] Date [facility] Agency Contacted: 07/27/19 Time HH Agency Contacted: 1430 Representative spoke with at [place]: [place] ([place]) Interventions    Readmission Risk Interventions No flowsheet data found.

## 2019-07-29 LAB — COMPREHENSIVE METABOLIC PANEL
ALT: 14 U/L (ref 0–44)
AST: 22 U/L (ref 15–41)
Albumin: 3 g/dL — ABNORMAL LOW (ref 3.5–5.0)
Alkaline Phosphatase: 56 U/L (ref 38–126)
Anion gap: 9 (ref 5–15)
BUN: 32 mg/dL — ABNORMAL HIGH (ref 8–23)
CO2: 25 mmol/L (ref 22–32)
Calcium: 8.9 mg/dL (ref 8.9–10.3)
Chloride: 103 mmol/L (ref 98–111)
Creatinine, Ser: 0.71 mg/dL (ref 0.44–1.00)
GFR calc Af Amer: >60 mL/min (ref >60)
GFR calc non Af Amer: >60 mL/min (ref >60)
Glucose, Bld: 103 mg/dL — ABNORMAL HIGH (ref 70–99)
Potassium: 4.7 mmol/L (ref 3.5–5.1)
Sodium: 137 mmol/L (ref 135–145)
Total Bilirubin: 0.8 mg/dL (ref 0.3–1.2)
Total Protein: 6.1 g/dL — ABNORMAL LOW (ref 6.5–8.1)

## 2019-07-29 MED ORDER — FUROSEMIDE 20 MG PO TABS: 20.0000 mg | ORAL_TABLET | ORAL | 0 refills | Status: DC

## 2019-07-29 NOTE — TOC Transition Note (Signed)
Transition of Care [facility]) - CM/SW Discharge Note   Patient Details  Name: Kathy Pierce MRN: [id number] Date of Birth: 1925-11-07  Transition of Care [facility]) CM/SW Contact:  Shade Flood, LCSW Phone Number: 07/29/2019, 9:56 AM   Clinical Narrative:     Pt stable for dc today per MD. [place] and O2 orders entered. Updated pt who remains in agreement with plan for dc home with [facility] and Home O2. Left list of private agency providers for non medical home aides at pt/family request.   Updated Cindie at [facility] of pt's dc. Contacted Zach with [employer] to arrange Home O2.   No other TOC needs identified for dc.  Final next level of care: [place] Barriers to Discharge: Barriers Resolved   Patient Goals and CMS Choice Patient states their goals for this hospitalization and ongoing recovery are:: go home CMS Medicare.gov Compare Post Acute Care list provided to:: Patient Represenative (must comment) Choice offered to / list presented to : Adult Children  Discharge Placement                       Discharge Plan and Services   Discharge Planning Services: CM Consult Post Acute Care Choice: Home Health          DME Arranged: Oxygen DME Agency: [employer] Date DME Agency Contacted: 07/29/19 Time DME Agency Contacted: [phone] Representative spoke with at DME Agency: Thedore Mins HH Arranged: OT, PT, RN [facility] Agency: [place] Date [place]: 07/27/19 Time [place]: 41 Representative spoke with at [place]: [place] ([place]) Interventions     Readmission Risk Interventions Readmission Risk Prevention Plan 07/29/2019  Transportation Screening Complete  Home Care Screening Complete  Medication Review (RN CM) Complete  Some recent data might be hidden

## 2019-07-29 NOTE — Progress Notes (Signed)
Occupational Therapy Treatment Patient Details Name: Kathy Pierce MRN: [id number] DOB: 1926/01/20 Today's Date: 07/29/2019    History of present illness 86 yo female admitted with CHF. Hx of COPD, lung ca, depression, osteoporosis, squamous cell cancer, spinal stenosis, non healing L leg wound   OT comments  Patient reports just used bathroom and washed her face "to help wake me up this morning!" Patient agreeable to functional ambulation with intermittent hand held assist due to mild unsteadiness. Educate patient in pursed lip breathing techniques during ambulation, upon returning to room pt seated EOB with O2 85-86% on room air. Cue patient to take slow steady breaths due to elevated RR in low 40's, within ~1 min pt return to 93-94% on room air and transfer to recliner. Educate patient use of O2 with activity at home, patient reports home O2 has been set up. Patient hopeful she will be going home today.   Follow Up Recommendations  No OT follow up    Equipment Recommendations  None recommended by OT       Precautions / Restrictions Precautions Precautions: Fall Precaution Comments: monitor O2 Restrictions Weight Bearing Restrictions: No       Mobility Bed Mobility               General bed mobility comments: seated EOB upon arrival  Transfers Overall transfer level: Needs assistance Equipment used: 1 person hand held assist Transfers: Sit to/from Stand Sit to Stand: Min guard         General transfer comment: intermittent hand held assist due to mild unsteadiness with functional ambulation in hallway    Balance Overall balance assessment: Needs assistance Sitting-balance support: Feet supported Sitting balance-Leahy Scale: Good     Standing balance support: Single extremity supported;During functional activity Standing balance-Leahy Scale: Fair Standing balance comment: intermittent hand held assist due to mild unsteadiness                             ADL either performed or assessed with clinical judgement   ADL Overall ADL's : Needs assistance/impaired                         Toilet Transfer: Min guard;Ambulation Toilet Transfer Details (indicate cue type and reason): simulated with functional ambulation and transfer to recliner, min guard due to mild unsteadiness          Functional mobility during ADLs: Min guard (hand held assist)                 Cognition Arousal/Alertness: Awake/alert Behavior During Therapy: WFL for tasks assessed/performed Overall Cognitive Status: Within Functional Limits for tasks assessed                                                General Comments pt desat to 85-86% on room air with functional ambulation, cue patient in pursed lip breathing and taking steady breaths due to high respiration rate, patient recover seated at edge of bed on room air within approx 1 min and continued PLB to 93-94%    Pertinent Vitals/ Pain       Pain Assessment: No/denies pain         Frequency  Min 2X/week        Progress Toward Goals  OT Goals(current  goals can now be found in the care plan section)  Progress towards OT goals: Progressing toward goals  Acute Rehab OT Goals Patient Stated Goal: home OT Goal Formulation: With patient/family Time For Goal Achievement: 08/10/19 Potential to Achieve Goals: Good ADL Goals Pt Will Perform Grooming: with modified independence;standing;sitting Pt Will Perform Upper Body Dressing: with modified independence;sitting;standing Pt Will Perform Lower Body Dressing: with modified independence;sit to/from stand;sitting/lateral leans Pt Will Transfer to Toilet: with modified independence;ambulating Pt Will Perform Tub/Shower Transfer: Tub transfer;with modified independence;shower seat;grab bars;ambulating  Plan Discharge plan remains appropriate       AM-PAC OT "6 Clicks" Daily Activity     Outcome Measure   Help from  another person eating meals?: None Help from another person taking care of personal grooming?: None Help from another person toileting, which includes using toliet, bedpan, or urinal?: A Little Help from another person bathing (including washing, rinsing, drying)?: A Little Help from another person to put on and taking off regular upper body clothing?: A Little Help from another person to put on and taking off regular lower body clothing?: A Little 6 Click Score: 20    End of Session  OT Visit Diagnosis: Other abnormalities of gait and mobility (R26.89)   Activity Tolerance Patient tolerated treatment well   Patient Left in chair;with call bell/phone within reach;with chair alarm set   Nurse Communication Mobility status        Time: 6433-2951 OT Time Calculation (min): 13 min  Charges: OT General Charges $OT Visit: 1 Visit OT Treatments $Therapeutic Activity: 8-22 mins  Delbert Phenix OT Pager: [place] 07/29/2019, 9:13 AM

## 2019-07-29 NOTE — Plan of Care (Signed)

## 2019-07-29 NOTE — Progress Notes (Signed)
Pt to be discharged to home this afternoon. Pt and Pt's Daughter Yolande Jolly given discharge instructions including all Medications and schedules for these Medications. CHF home  reviewed with Pt and Pt's Daughter. Pt and Pt's Daughter verbalized understanding of all discharge teaching. Discharge packet and oxygen with Pt at time of discharge

## 2019-07-29 NOTE — Care Management Important Message (Signed)
Important Message  Patient Details IM Letter presented to the Patient Name: Kathy Pierce MRN: [id number] Date of Birth: 22-Jul-1925   Medicare Important Message Given:  Yes     Kerin Salen 07/29/2019, 11:36 AM

## 2019-07-29 NOTE — Discharge Summary (Addendum)
Physician Discharge Summary  Kathy Pierce ZOX:[id number] DOB: [birth year] DOA: 07/26/2019  PCP: London Pepper, MD  Admit date: 07/26/2019 Discharge date: 07/29/2019  Time spent: 35 minutes  Recommendations for Outpatient Follow-up:  1. Note CBC and differential medication changes this admission as per MAR-Lasix started this admission 2. Repeat Chem-12, CBC plus differential in 1 week 3. Recommend outpatient wound evaluation 4. Recommend outpatient coordination of needs for LABA LAMA 5. Will need home oxygen on discharge 6. Not sure if would continue aspirin-outpatient consideration for the same given her age and probably lack of mortality benefit 7. Follow-up with oncology with regards to her thrombocytosis continue hydroxyurea labs as above 8. Needs TSH in 2 to 3 weeks once she is at her normal state   Discharge Diagnoses:  Active Problems:   Congestive heart failure (CHF) ([place])   Discharge Condition: Improved  Diet recommendation: Heart healthy low-salt fluid restricted to 1500 cc in the winter and1800 cc in the summer  Filed Weights   07/27/19 0307 07/28/19 0500 07/29/19 0500  Weight: 49 kg 48.3 kg 49.2 kg    History of present illness:  86 year old white female community dwelling COPD Lung cancer left lower lobe bronchogenic-stereotactic radiation 03/2016 on observation- Essential thrombocytopenia previously hydroxyurea HTN Osteoporosis 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 New onset heart failure BNP 350 a. Empirically treating-had best response to Lasix b. Underlying lung disease probably from years of smoking quit finally in 1980--this may have some bearing on her recovery c. Echocardiogram showed diastolic dysfunction grade 1 d. IV Lasix changed to p.o. 20 Lasix-on discharge she  will be getting q. other daily Lasix 20 mg with instructions to take an extra dose if she gains weight as per my instructions below e. Weight was stable in the 48 range 2. Elevated troponin I 71 a. No chest pain whatsoever would not check further 3. AKI a. Abrupt rise in creatinine-discontinued lisinopril b. Adjusted Lasix 7/1 c. Her creatinine and BUN came down slightly she will go home on q. other day Lasix 4. COPD/emphysema on CT scan unable to afford LABA/LAMA a. Documented in pulmonology's prior notes-for now continue Anoro Ellipta and Tessalon b. Will need outpatient coordination of meds with her PCP 5. HTN a. Continue nonselective atenolol b. Blood pressures are reasonably controlled c. Might consider discontinuation of aspirin 81 mg as outpatient with shared decision making by PCP 6. Hypokalemia likely secondary to diuresis a. Replacement x1 given K. Dur 40 b. Potassium is better 7. Hyperglycemia not otherwise specified a. Outpatient checks b. Would not aggressively control given her advanced age and risk of complications with aggressive control in the [place] subset of population 8. Thrombocytosis-essential 9. Left lower lung stereotactic XRT 2018 on observation a. Continue hydroxyurea does need follow-up b. Outpatient follow-up 10. Depression a. Continue sertraline 100 daily 11. Moderate to severe malnutrition a. BMI 17 b. Likely secondary to frailty and advanced age-no data to suggest improvements occur with supplements 12. Reflux 13. Hypothyroidism  Procedures: Echocardiogram Consultations:  None  Discharge Exam: Vitals:   07/29/19 0511 07/29/19 0817  BP: (!) 149/85   Pulse: 81   Resp: 18   Temp: 98.1 F (36.7 C)   SpO2: 95% 93%   Patient is feeling well today she is sitting up at the bedside ambulated she has no chest pain whatsoever  has not been having issues with nausea vomiting and feels close to her normal We had a long discussion about fluid and fluid  restriction   General: Awake alert coherent no distress EOMI NCAT no focal deficit neck soft supple Cardiovascular: S1-S2 no murmur rub or gallop Monitor show heart rate in the 70s sinus Respiratory: Clinically clear no rales no rhonchi   Discharge Instructions   Discharge Instructions    Diet - low sodium heart healthy   Complete by: As directed    Discharge wound care:   Complete by: As directed    Please ensure that you look at your medications carefully-you will notice that you are now on a medication called Lasix that I will give you every other day for fluid This replaces your daily HCTZ-both are diuretics but the Lasix will help to keep off the fluid a little bit more efficiently-the reason you are not taking it every day is because you developed some dehydration during this hospitalization and in an effort to make sure we do not dehydrate you too much I would give it to every other day If you gain more than 2 pounds in a 24-hour period of time or 5 pounds in a week I would encourage you to take an extra dose on that day I would also recommend that you follow-up with your dermatologist to ensure that the wound on your right shin is healing-please continue the dressings that you have been doing at home at this time but have this looked at We will ask home health to come out and see you You will need labs in a week at your regular physician's office as we discussed I would encourage you to cut back a little bit on your salt and some of your fluid intake which will help you keep fluid off   Increase activity slowly   Complete by: As directed      Allergies as of 07/29/2019      Reactions   Penicillins Hives, Rash   Did it involve swelling of the face/tongue/throat, SOB, or low BP? N Did it involve sudden or severe rash/hives, skin peeling, or any reaction on the inside of your mouth or nose? Y Did you need to seek medical attention at a hospital or doctor's office? N When did it  last happen?Over 46 Years Ago If all above answers are "NO", may proceed with cephalosporin use.   Codeine Nausea And Vomiting   Erythromycin Nausea And Vomiting   Sulfa Drugs Cross Reactors Nausea And Vomiting      Medication List    STOP taking these medications   hydrochlorothiazide 12.5 MG capsule Commonly known as: MICROZIDE     TAKE these medications   albuterol 108 (90 Base) MCG/ACT inhaler Commonly known as: VENTOLIN HFA Inhale 2 puffs into the lungs every 6 (six) hours as needed for wheezing or shortness of breath.   Anoro Ellipta 62.5-25 MCG/INH Aepb Generic drug: umeclidinium-vilanterol Inhale 1 puff into the lungs daily. Notes to patient: Last Dose of this Inhaler was given on 07/29/2019 at 8:17 am    ARTIFICIAL TEARS OP Place 1 drop into both eyes as needed (for dry eyes).   aspirin 81 MG tablet Take 81 mg by mouth daily. Notes to patient: Last Dose of this medication was given on 07/29/2019 at 9:59 am ;   atenolol 25 MG tablet Commonly known as: TENORMIN Take 25 mg by mouth daily. What changed: Another medication with the same name was removed.  Continue taking this medication, and follow the directions you see here.   benzonatate 100 MG capsule Commonly known as: TESSALON Take 100 mg by mouth 3 (three) times daily as needed for cough.   calcium carbonate 1250 (500 Ca) MG tablet Commonly known as: OS-CAL - dosed in mg of elemental calcium Take 1 tablet by mouth 2 (two) times daily with a meal.   cyanocobalamin 1000 MCG/ML injection Commonly known as: (VITAMIN B-12) Inject 1,000 mcg into the muscle every 30 (thirty) days. Notes to patient: This Injection is schedules for every 30 Days Please follow home schedule   furosemide 20 MG tablet Commonly known as: LASIX Take 1 tablet (20 mg total) by mouth every other day. Notes to patient: This Medication is to be taken EVERY Other Day. This Medication was given on 07/29/2019 at 9:58 am    hydroxyurea 500  MG capsule Commonly known as: HYDREA Take 500 mg by mouth every other day. May take with food to minimize GI side effects. Reports alternating one day one day then two tablets the next.   levothyroxine 25 MCG tablet Commonly known as: SYNTHROID Take 25 mcg by mouth daily. Notes to patient: Last Dose of this Medication was given on 07/29/2019 at 5:05 am   meclizine 25 MG tablet Commonly known as: ANTIVERT Take 25 mg by mouth 3 (three) times daily as needed for dizziness.   omeprazole 20 MG capsule Commonly known as: PRILOSEC Take 20 mg by mouth every morning.   PRESERVISION AREDS PO Take 1 tablet by mouth daily.   sertraline 100 MG tablet Commonly known as: ZOLOFT Take 100 mg by mouth daily. Notes to patient: Last Dose of this Medication was given on 07/29/2019 at 9:58 am   traMADol 50 MG tablet Commonly known as: ULTRAM Take 50 mg by mouth every 8 (eight) hours as needed for moderate pain.            Durable Medical Equipment  (From admission, onward)         Start     Ordered   07/29/19 [phone]  For home use only DME oxygen  Once       Question Answer Comment  Length of Need Lifetime   Mode or (Route) Nasal cannula   Liters per Minute 3   Frequency Continuous (stationary and portable oxygen unit needed)   Oxygen conserving device No   Oxygen delivery system Gas      07/29/19 0937           Discharge Care Instructions  (From admission, onward)         Start     Ordered   07/29/19 0000  Discharge wound care:       Comments: Please ensure that you look at your medications carefully-you will notice that you are now on a medication called Lasix that I will give you every other day for fluid This replaces your daily HCTZ-both are diuretics but the Lasix will help to keep off the fluid a little bit more efficiently-the reason you are not taking it every day is because you developed some dehydration during this hospitalization and in an effort to make sure we do not  dehydrate you too much I would give it to every other day If you gain more than 2 pounds in a 24-hour period of time or 5 pounds in a week I would encourage you to take an extra dose on that day I would also recommend that you follow-up with your  dermatologist to ensure that the wound on your right shin is healing-please continue the dressings that you have been doing at home at this time but have this looked at We will ask home health to come out and see you You will need labs in a week at your regular physician's office as we discussed I would encourage you to cut back a little bit on your salt and some of your fluid intake which will help you keep fluid off   07/29/19 0932         Allergies  Allergen Reactions  . Penicillins Hives and Rash    Did it involve swelling of the face/tongue/throat, SOB, or low BP? N Did it involve sudden or severe rash/hives, skin peeling, or any reaction on the inside of your mouth or nose? Y Did you need to seek medical attention at a hospital or doctor's office? N When did it last happen?Over 30 Years Ago If all above answers are "NO", may proceed with cephalosporin use.    . Codeine Nausea And Vomiting  . Erythromycin Nausea And Vomiting  . Sulfa Drugs Cross Reactors Nausea And Vomiting    Follow-up Information    [facility], [facility] Follow up.   Specialty: [place] Why: [place] physical therapy/aide Contact information: [address] [place] [id number] [phone]                The results of significant diagnostics from this hospitalization (including imaging, microbiology, ancillary and laboratory) are listed below for reference.    Significant Diagnostic Studies: DG Chest 2 View  Result Date: 07/26/2019 CLINICAL DATA:  Cough, increasing shortness of breath for 2 days, COPD EXAM: CHEST - 2 VIEW COMPARISON:  04/22/2019 FINDINGS: Frontal and lateral views of the chest demonstrate a stable cardiac  silhouette. Chronic scarring is seen along the left major fissure. There is extensive background scarring and hyperinflation compatible with emphysema. Since the prior exam, central vascular congestion and diffuse interstitial prominence have developed, consistent with pulmonary edema. No effusion or pneumothorax. No acute bony abnormalities. IMPRESSION: 1. Interstitial edema superimposed upon background emphysema. Electronically Signed   By: Randa Ngo M.D.   On: 07/26/2019 20:51   CT ANGIO CHEST PE W OR WO CONTRAST  Result Date: 07/27/2019 CLINICAL DATA:  Shortness of breath EXAM: CT ANGIOGRAPHY CHEST WITH CONTRAST TECHNIQUE: Multidetector CT imaging of the chest was performed using the standard protocol during bolus administration of intravenous contrast. Multiplanar CT image reconstructions and MIPs were obtained to evaluate the vascular anatomy. CONTRAST:  40mL OMNIPAQUE IOHEXOL 350 MG/ML SOLN COMPARISON:  11/19/2018 FINDINGS: Cardiovascular: Satisfactory opacification of the pulmonary arteries to the segmental level. No evidence of pulmonary embolism when allowing for levels of motion artifact. Enlarged heart size. No pericardial effusion. Aortic and coronary atherosclerosis Mediastinum/Nodes: Negative for adenopathy or mass. Lungs/Pleura: Radiation fibrosis around fiducial clips in the upper left lung. Centrilobular and panlobular emphysema. Mild dependent atelectasis and trace pleural fluid. No consolidation, interlobular septal thickening, or pneumothorax. Upper Abdomen: Layering high-density in the gallbladder which was also seen on prior, with homogeneous appearance suggesting milk of calcium or microlithiasis. No acute finding. Musculoskeletal: Remote left rib fractures which may be posttraumatic or related to radiation. Remote T6 compression fracture. No acute or aggressive finding Review of the MIP images confirms the above findings. IMPRESSION: 1. No evidence of pulmonary embolism.  Motion is  a limiting factor. 2. Irradiated left lung cancer without worrisome features. 3. Cardiomegaly and trace pleural  effusions. 4. Advanced emphysema. Aortic Atherosclerosis (ICD10-I70.0) and Emphysema (ICD10-J43.9). Electronically Signed   By: Monte Fantasia M.D.   On: 07/27/2019 05:58   ECHOCARDIOGRAM COMPLETE  Result Date: 07/27/2019    ECHOCARDIOGRAM REPORT   Patient Name:   Kathy Pierce [facility] Date of Exam: 07/27/2019 Medical Rec #:  [id number]     Height:       65.0 in Accession #:    [id number]    Weight:       108.0 lb Date of Birth:  09-18-1925    BSA:          1.522 m Patient Age:    65 years      BP:           106/63 mmHg Patient Gender: F             HR:           84 bpm. Exam Location:  Inpatient Procedure: 2D Echo Indications:    Congestive Heart Failure I50.9  History:        Patient has prior history of Echocardiogram examinations, most                 recent 06/16/2016. COPD; Risk Factors:Hypertension.  Sonographer:    Mikki Santee RDCS (AE) Referring Phys: [id number] [place]  1. Left ventricular ejection fraction, by estimation, is 60 to 65%. The left ventricle has normal function. The left ventricle has no regional wall motion abnormalities. There is mild concentric left ventricular hypertrophy. Left ventricular diastolic parameters are consistent with Grade II diastolic dysfunction (pseudonormalization). Elevated left ventricular end-diastolic pressure.  2. Right ventricular systolic function is normal. The right ventricular size is normal. There is moderately elevated pulmonary artery systolic pressure.  3. Left atrial size was severely dilated.  4. Right atrial size was mildly dilated.  5. The mitral valve is normal in structure. Trivial mitral valve regurgitation. No evidence of mitral stenosis.  6. The aortic valve is tricuspid. Aortic valve regurgitation is trivial. Mild aortic valve sclerosis is present, with no evidence of aortic valve stenosis.  7. Aortic dilatation noted.  There is mild dilatation of the ascending aorta measuring 38 mm.  8. The inferior vena cava is dilated in size with >50% respiratory variability, suggesting right atrial pressure of 8 mmHg. FINDINGS  Left Ventricle: Left ventricular ejection fraction, by estimation, is 60 to 65%. The left ventricle has normal function. The left ventricle has no regional wall motion abnormalities. The left ventricular internal cavity size was normal in size. There is  mild concentric left ventricular hypertrophy. Left ventricular diastolic parameters are consistent with Grade II diastolic dysfunction (pseudonormalization). Elevated left ventricular end-diastolic pressure. Right Ventricle: The right ventricular size is normal. No increase in right ventricular wall thickness. Right ventricular systolic function is normal. There is moderately elevated pulmonary artery systolic pressure. The tricuspid regurgitant velocity is 3.18 m/s, and with an assumed right atrial pressure of 8 mmHg, the estimated right ventricular systolic pressure is 94.1 mmHg. Left Atrium: Left atrial size was severely dilated. Right Atrium: Right atrial size was mildly dilated. Pericardium: There is no evidence of pericardial effusion. Mitral Valve: The mitral valve is normal in structure. Normal mobility of the mitral valve leaflets. Mild mitral annular calcification. Trivial mitral valve regurgitation. No evidence of mitral valve stenosis. Tricuspid Valve: The tricuspid valve is normal in structure. Tricuspid valve regurgitation is mild . No evidence of tricuspid stenosis. Aortic Valve: The aortic valve is tricuspid. . There is  mild thickening and mild calcification of the aortic valve. Aortic valve regurgitation is trivial. Mild aortic valve sclerosis is present, with no evidence of aortic valve stenosis. There is mild thickening of the aortic valve. There is mild calcification of the aortic valve. Pulmonic Valve: The pulmonic valve was normal in structure.  Pulmonic valve regurgitation is not visualized. No evidence of pulmonic stenosis. Aorta: Aortic dilatation noted. There is mild dilatation of the ascending aorta measuring 38 mm. Venous: The inferior vena cava is dilated in size with greater than 50% respiratory variability, suggesting right atrial pressure of 8 mmHg. IAS/Shunts: No atrial level shunt detected by color flow Doppler.  LEFT VENTRICLE PLAX 2D LVIDd:         3.90 cm  Diastology LVIDs:         2.10 cm  LV e' lateral:   6.31 cm/s LV PW:         1.10 cm  LV E/e' lateral: 17.4 LV IVS:        1.20 cm  LV e' medial:    5.87 cm/s LVOT diam:     2.00 cm  LV E/e' medial:  18.7 LV SV:         69 LV SV Index:   45 LVOT Area:     3.14 cm  RIGHT VENTRICLE RV S prime:     12.40 cm/s TAPSE (M-mode): 1.1 cm LEFT ATRIUM             Index       RIGHT ATRIUM           Index LA diam:        3.60 cm 2.36 cm/m  RA Area:     18.20 cm LA Vol (A2C):   76.7 ml 50.38 ml/m RA Volume:   46.90 ml  30.81 ml/m LA Vol (A4C):   50.4 ml 33.11 ml/m LA Biplane Vol: 63.7 ml 41.84 ml/m  AORTIC VALVE LVOT Vmax:   88.60 cm/s LVOT Vmean:  68.600 cm/s LVOT VTI:    0.220 m  AORTA Ao Root diam: 3.20 cm MITRAL VALVE                TRICUSPID VALVE MV Area (PHT): 2.93 cm     TR Peak grad:   40.4 mmHg MV Decel Time: 259 msec     TR Vmax:        318.00 cm/s MV E velocity: 110.00 cm/s MV A velocity: 93.00 cm/s   SHUNTS MV E/A ratio:  1.18         Systemic VTI:  0.22 m                             Systemic Diam: 2.00 cm Skeet Latch MD Electronically signed by Skeet Latch MD Signature Date/Time: 07/27/2019/11:58:08 AM    Final     Microbiology: Recent Results (from the past 240 hour(s))  SARS Coronavirus 2 by RT PCR (hospital order, performed in [place] hospital lab) Nasopharyngeal Nasopharyngeal Swab     Status: None   Collection Time: 07/26/19  8:27 PM   Specimen: Nasopharyngeal Swab  Result Value Ref Range Status   SARS Coronavirus 2 NEGATIVE NEGATIVE Final    Comment:  (NOTE) SARS-CoV-2 target nucleic acids are NOT DETECTED.  The SARS-CoV-2 RNA is generally detectable in upper and lower respiratory specimens during the acute phase of infection. The lowest concentration of SARS-CoV-2 viral copies this assay can detect is  250 copies / mL. A negative result does not preclude SARS-CoV-2 infection and should not be used as the sole basis for treatment or other patient management decisions.  A negative result may occur with improper specimen collection / handling, submission of specimen other than nasopharyngeal swab, presence of viral mutation(s) within the areas targeted by this assay, and inadequate number of viral copies (<250 copies / mL). A negative result must be combined with clinical observations, patient history, and epidemiological information.  Fact Sheet for Patients:   StrictlyIdeas.no  Fact Sheet for Healthcare Providers: BankingDealers.co.za  This test is not yet approved or  cleared by the [place] FDA and has been authorized for detection and/or diagnosis of SARS-CoV-2 by FDA under an Emergency Use Authorization (EUA).  This EUA will remain in effect (meaning this test can be used) for the duration of the COVID-19 declaration under Section 564(b)(1) of the Act, 21 U.S.C. section 360bbb-3(b)(1), unless the authorization is terminated or revoked sooner.  Performed at [facility], [place] [address], [place], [place] [address]      Labs: Basic Metabolic Panel: Recent Labs  Lab 07/26/19 2027 07/27/19 0118 07/27/19 0526 07/28/19 0511 07/29/19 0812  NA 137  --  136 139 137  K 4.5  --  3.0* 4.0 4.7  CL 101  --  97* 99 103  CO2 26  --  29 27 25   GLUCOSE 106*  --  179* 100* 103*  BUN 20  --  18 35* 32*  CREATININE 0.62 0.61 0.65 0.90 0.71  CALCIUM 8.7*  --  8.5* 9.0 8.9   Liver Function Tests: Recent Labs  Lab 07/28/19 0511 07/29/19 0812  AST 22 22  ALT  14 14  ALKPHOS 52 56  BILITOT 0.8 0.8  PROT 6.2* 6.1*  ALBUMIN 3.0* 3.0*   No results for input(s): LIPASE, AMYLASE in the last 168 hours. No results for input(s): AMMONIA in the last 168 hours. CBC: Recent Labs  Lab 07/26/19 2027 07/27/19 0118 07/28/19 0511  WBC 9.0 7.7 10.7*  NEUTROABS 6.4  --  7.9*  HGB 13.2 13.1 13.0  HCT 40.0 39.2 39.7  MCV 113.3* 111.4* 113.4*  PLT 569* 606* 744*   Cardiac Enzymes: No results for input(s): CKTOTAL, CKMB, CKMBINDEX, TROPONINI in the last 168 hours. BNP: BNP (last 3 results) Recent Labs    07/26/19 2027  BNP 350.8*    ProBNP (last 3 results) No results for input(s): PROBNP in the last 8760 hours.  CBG: No results for input(s): GLUCAP in the last 168 hours.     Signed:  Nita Sells MD   [facility] 07/29/2019, 9:32 AM

## 2019-08-05 DIAGNOSIS — I083 Combined rheumatic disorders of mitral, aortic and tricuspid valves: Secondary | ICD-10-CM | POA: Diagnosis not present

## 2019-08-05 DIAGNOSIS — D696 Thrombocytopenia, unspecified: Secondary | ICD-10-CM | POA: Diagnosis not present

## 2019-08-05 DIAGNOSIS — I110 Hypertensive heart disease with heart failure: Secondary | ICD-10-CM | POA: Diagnosis not present

## 2019-08-05 DIAGNOSIS — J439 Emphysema, unspecified: Secondary | ICD-10-CM | POA: Diagnosis not present

## 2019-08-05 DIAGNOSIS — E039 Hypothyroidism, unspecified: Secondary | ICD-10-CM | POA: Diagnosis not present

## 2019-08-05 DIAGNOSIS — I509 Heart failure, unspecified: Secondary | ICD-10-CM | POA: Diagnosis not present

## 2019-08-05 DIAGNOSIS — M810 Age-related osteoporosis without current pathological fracture: Secondary | ICD-10-CM | POA: Diagnosis not present

## 2019-08-05 DIAGNOSIS — I700 Atherosclerosis of aorta: Secondary | ICD-10-CM | POA: Diagnosis not present

## 2019-08-05 DIAGNOSIS — M48061 Spinal stenosis, lumbar region without neurogenic claudication: Secondary | ICD-10-CM | POA: Diagnosis not present

## 2019-08-05 DIAGNOSIS — C44729 Squamous cell carcinoma of skin of left lower limb, including hip: Secondary | ICD-10-CM | POA: Diagnosis not present

## 2019-08-05 DIAGNOSIS — R739 Hyperglycemia, unspecified: Secondary | ICD-10-CM | POA: Diagnosis not present

## 2019-08-05 DIAGNOSIS — E538 Deficiency of other specified B group vitamins: Secondary | ICD-10-CM | POA: Diagnosis not present

## 2019-08-05 DIAGNOSIS — E876 Hypokalemia: Secondary | ICD-10-CM | POA: Diagnosis not present

## 2019-08-05 DIAGNOSIS — I0981 Rheumatic heart failure: Secondary | ICD-10-CM | POA: Diagnosis not present

## 2019-08-05 DIAGNOSIS — N179 Acute kidney failure, unspecified: Secondary | ICD-10-CM | POA: Diagnosis not present

## 2019-08-05 DIAGNOSIS — E440 Moderate protein-calorie malnutrition: Secondary | ICD-10-CM | POA: Diagnosis not present

## 2019-08-08 DIAGNOSIS — J9610 Chronic respiratory failure, unspecified whether with hypoxia or hypercapnia: Secondary | ICD-10-CM | POA: Diagnosis not present

## 2019-08-08 DIAGNOSIS — I509 Heart failure, unspecified: Secondary | ICD-10-CM | POA: Diagnosis not present

## 2019-08-08 DIAGNOSIS — I10 Essential (primary) hypertension: Secondary | ICD-10-CM | POA: Diagnosis not present

## 2019-08-08 DIAGNOSIS — J449 Chronic obstructive pulmonary disease, unspecified: Secondary | ICD-10-CM | POA: Diagnosis not present

## 2019-08-10 DIAGNOSIS — C44622 Squamous cell carcinoma of skin of right upper limb, including shoulder: Secondary | ICD-10-CM | POA: Diagnosis not present

## 2019-08-10 DIAGNOSIS — S81802D Unspecified open wound, left lower leg, subsequent encounter: Secondary | ICD-10-CM | POA: Diagnosis not present

## 2019-08-11 ENCOUNTER — Inpatient Hospital Stay (HOSPITAL_COMMUNITY): Payer: Medicare Other | Admitting: Certified Registered Nurse Anesthetist

## 2019-08-11 ENCOUNTER — Encounter (HOSPITAL_COMMUNITY)
Admission: EM | Disposition: A | Discharge status: Skilled Nursing Facility | Payer: Self-pay | Source: Home / Self Care | Attending: Internal Medicine

## 2019-08-11 ENCOUNTER — Other Ambulatory Visit: Payer: Self-pay

## 2019-08-11 ENCOUNTER — Emergency Department (HOSPITAL_COMMUNITY): Payer: Medicare Other

## 2019-08-11 ENCOUNTER — Inpatient Hospital Stay (HOSPITAL_COMMUNITY)
Admission: EM | Admit: 2019-08-11 | Discharge: 2019-08-15 | DRG: 481 | Disposition: A | Discharge status: Skilled Nursing Facility | Payer: Medicare Other | Attending: Internal Medicine | Admitting: Internal Medicine

## 2019-08-11 ENCOUNTER — Encounter (HOSPITAL_COMMUNITY): Payer: Self-pay | Admitting: Emergency Medicine

## 2019-08-11 ENCOUNTER — Inpatient Hospital Stay (HOSPITAL_COMMUNITY): Payer: Medicare Other

## 2019-08-11 DIAGNOSIS — I110 Hypertensive heart disease with heart failure: Secondary | ICD-10-CM | POA: Diagnosis not present

## 2019-08-11 DIAGNOSIS — F329 Major depressive disorder, single episode, unspecified: Secondary | ICD-10-CM | POA: Diagnosis not present

## 2019-08-11 DIAGNOSIS — Z809 Family history of malignant neoplasm, unspecified: Secondary | ICD-10-CM | POA: Diagnosis not present

## 2019-08-11 DIAGNOSIS — M810 Age-related osteoporosis without current pathological fracture: Secondary | ICD-10-CM | POA: Diagnosis not present

## 2019-08-11 DIAGNOSIS — Z885 Allergy status to narcotic agent status: Secondary | ICD-10-CM

## 2019-08-11 DIAGNOSIS — S72142D Displaced intertrochanteric fracture of left femur, subsequent encounter for closed fracture with routine healing: Secondary | ICD-10-CM | POA: Diagnosis not present

## 2019-08-11 DIAGNOSIS — S7221XA Displaced subtrochanteric fracture of right femur, initial encounter for closed fracture: Secondary | ICD-10-CM | POA: Diagnosis not present

## 2019-08-11 DIAGNOSIS — Z85828 Personal history of other malignant neoplasm of skin: Secondary | ICD-10-CM | POA: Diagnosis not present

## 2019-08-11 DIAGNOSIS — M2550 Pain in unspecified joint: Secondary | ICD-10-CM | POA: Diagnosis not present

## 2019-08-11 DIAGNOSIS — S40812A Abrasion of left upper arm, initial encounter: Secondary | ICD-10-CM | POA: Diagnosis present

## 2019-08-11 DIAGNOSIS — S72009A Fracture of unspecified part of neck of unspecified femur, initial encounter for closed fracture: Secondary | ICD-10-CM | POA: Diagnosis present

## 2019-08-11 DIAGNOSIS — D62 Acute posthemorrhagic anemia: Secondary | ICD-10-CM | POA: Diagnosis not present

## 2019-08-11 DIAGNOSIS — S7292XD Unspecified fracture of left femur, subsequent encounter for closed fracture with routine healing: Secondary | ICD-10-CM | POA: Diagnosis not present

## 2019-08-11 DIAGNOSIS — W19XXXA Unspecified fall, initial encounter: Secondary | ICD-10-CM | POA: Diagnosis not present

## 2019-08-11 DIAGNOSIS — Z79899 Other long term (current) drug therapy: Secondary | ICD-10-CM

## 2019-08-11 DIAGNOSIS — M80052A Age-related osteoporosis with current pathological fracture, left femur, initial encounter for fracture: Secondary | ICD-10-CM | POA: Diagnosis not present

## 2019-08-11 DIAGNOSIS — Z881 Allergy status to other antibiotic agents status: Secondary | ICD-10-CM

## 2019-08-11 DIAGNOSIS — Z20822 Contact with and (suspected) exposure to covid-19: Secondary | ICD-10-CM | POA: Diagnosis present

## 2019-08-11 DIAGNOSIS — Z923 Personal history of irradiation: Secondary | ICD-10-CM | POA: Diagnosis not present

## 2019-08-11 DIAGNOSIS — S79929A Unspecified injury of unspecified thigh, initial encounter: Secondary | ICD-10-CM | POA: Diagnosis not present

## 2019-08-11 DIAGNOSIS — Y92014 Private driveway to single-family (private) house as the place of occurrence of the external cause: Secondary | ICD-10-CM | POA: Diagnosis not present

## 2019-08-11 DIAGNOSIS — R079 Chest pain, unspecified: Secondary | ICD-10-CM | POA: Diagnosis not present

## 2019-08-11 DIAGNOSIS — E039 Hypothyroidism, unspecified: Secondary | ICD-10-CM | POA: Diagnosis present

## 2019-08-11 DIAGNOSIS — R278 Other lack of coordination: Secondary | ICD-10-CM | POA: Diagnosis not present

## 2019-08-11 DIAGNOSIS — Z85118 Personal history of other malignant neoplasm of bronchus and lung: Secondary | ICD-10-CM

## 2019-08-11 DIAGNOSIS — Z880 Allergy status to penicillin: Secondary | ICD-10-CM

## 2019-08-11 DIAGNOSIS — I509 Heart failure, unspecified: Secondary | ICD-10-CM | POA: Diagnosis not present

## 2019-08-11 DIAGNOSIS — Z09 Encounter for follow-up examination after completed treatment for conditions other than malignant neoplasm: Secondary | ICD-10-CM

## 2019-08-11 DIAGNOSIS — Z8249 Family history of ischemic heart disease and other diseases of the circulatory system: Secondary | ICD-10-CM | POA: Diagnosis not present

## 2019-08-11 DIAGNOSIS — Z7982 Long term (current) use of aspirin: Secondary | ICD-10-CM

## 2019-08-11 DIAGNOSIS — Z66 Do not resuscitate: Secondary | ICD-10-CM | POA: Diagnosis not present

## 2019-08-11 DIAGNOSIS — E611 Iron deficiency: Secondary | ICD-10-CM | POA: Diagnosis not present

## 2019-08-11 DIAGNOSIS — J449 Chronic obstructive pulmonary disease, unspecified: Secondary | ICD-10-CM | POA: Diagnosis not present

## 2019-08-11 DIAGNOSIS — S72142A Displaced intertrochanteric fracture of left femur, initial encounter for closed fracture: Secondary | ICD-10-CM | POA: Diagnosis not present

## 2019-08-11 DIAGNOSIS — Z7401 Bed confinement status: Secondary | ICD-10-CM | POA: Diagnosis not present

## 2019-08-11 DIAGNOSIS — J439 Emphysema, unspecified: Secondary | ICD-10-CM | POA: Diagnosis not present

## 2019-08-11 DIAGNOSIS — Z87891 Personal history of nicotine dependence: Secondary | ICD-10-CM | POA: Diagnosis not present

## 2019-08-11 DIAGNOSIS — W010XXA Fall on same level from slipping, tripping and stumbling without subsequent striking against object, initial encounter: Secondary | ICD-10-CM | POA: Diagnosis present

## 2019-08-11 DIAGNOSIS — E441 Mild protein-calorie malnutrition: Secondary | ICD-10-CM | POA: Diagnosis not present

## 2019-08-11 DIAGNOSIS — T451X5A Adverse effect of antineoplastic and immunosuppressive drugs, initial encounter: Secondary | ICD-10-CM | POA: Diagnosis present

## 2019-08-11 DIAGNOSIS — S72145A Nondisplaced intertrochanteric fracture of left femur, initial encounter for closed fracture: Secondary | ICD-10-CM | POA: Diagnosis not present

## 2019-08-11 DIAGNOSIS — Z882 Allergy status to sulfonamides status: Secondary | ICD-10-CM

## 2019-08-11 DIAGNOSIS — Z79890 Hormone replacement therapy (postmenopausal): Secondary | ICD-10-CM

## 2019-08-11 DIAGNOSIS — Z419 Encounter for procedure for purposes other than remedying health state, unspecified: Secondary | ICD-10-CM

## 2019-08-11 DIAGNOSIS — Z03818 Encounter for observation for suspected exposure to other biological agents ruled out: Secondary | ICD-10-CM | POA: Diagnosis not present

## 2019-08-11 DIAGNOSIS — R2689 Other abnormalities of gait and mobility: Secondary | ICD-10-CM | POA: Diagnosis not present

## 2019-08-11 DIAGNOSIS — I5032 Chronic diastolic (congestive) heart failure: Secondary | ICD-10-CM | POA: Diagnosis not present

## 2019-08-11 DIAGNOSIS — C3432 Malignant neoplasm of lower lobe, left bronchus or lung: Secondary | ICD-10-CM | POA: Diagnosis not present

## 2019-08-11 DIAGNOSIS — D519 Vitamin B12 deficiency anemia, unspecified: Secondary | ICD-10-CM | POA: Diagnosis not present

## 2019-08-11 DIAGNOSIS — Z7951 Long term (current) use of inhaled steroids: Secondary | ICD-10-CM

## 2019-08-11 DIAGNOSIS — I10 Essential (primary) hypertension: Secondary | ICD-10-CM | POA: Diagnosis not present

## 2019-08-11 DIAGNOSIS — M6281 Muscle weakness (generalized): Secondary | ICD-10-CM | POA: Diagnosis not present

## 2019-08-11 DIAGNOSIS — R58 Hemorrhage, not elsewhere classified: Secondary | ICD-10-CM | POA: Diagnosis not present

## 2019-08-11 DIAGNOSIS — I959 Hypotension, unspecified: Secondary | ICD-10-CM | POA: Diagnosis not present

## 2019-08-11 HISTORY — PX: INTRAMEDULLARY (IM) NAIL INTERTROCHANTERIC: SHX5875

## 2019-08-11 HISTORY — DX: Dyspnea, unspecified: R06.00

## 2019-08-11 LAB — CBC WITH DIFFERENTIAL/PLATELET
Abs Immature Granulocytes: 0.08 10*3/uL — ABNORMAL HIGH (ref 0.00–0.07)
Basophils Absolute: 0 10*3/uL (ref 0.0–0.1)
Basophils Relative: 0 %
Eosinophils Absolute: 0.2 10*3/uL (ref 0.0–0.5)
Eosinophils Relative: 1 %
HCT: 38.3 % (ref 36.0–46.0)
Hemoglobin: 12.4 g/dL (ref 12.0–15.0)
Immature Granulocytes: 1 %
Lymphocytes Relative: 2 %
Lymphs Abs: 0.3 10*3/uL — ABNORMAL LOW (ref 0.7–4.0)
MCH: 36.8 pg — ABNORMAL HIGH (ref 26.0–34.0)
MCHC: 32.4 g/dL (ref 30.0–36.0)
MCV: 113.6 fL — ABNORMAL HIGH (ref 80.0–100.0)
Monocytes Absolute: 1.3 10*3/uL — ABNORMAL HIGH (ref 0.1–1.0)
Monocytes Relative: 11 %
Neutro Abs: 10.6 10*3/uL — ABNORMAL HIGH (ref 1.7–7.7)
Neutrophils Relative %: 85 %
Platelets: 649 10*3/uL — ABNORMAL HIGH (ref 150–400)
RBC: 3.37 MIL/uL — ABNORMAL LOW (ref 3.87–5.11)
RDW: 14.8 % (ref 11.5–15.5)
WBC: 12.5 10*3/uL — ABNORMAL HIGH (ref 4.0–10.5)
nRBC: 0 % (ref 0.0–0.2)

## 2019-08-11 LAB — BASIC METABOLIC PANEL
Anion gap: 12 (ref 5–15)
BUN: 23 mg/dL (ref 8–23)
CO2: 27 mmol/L (ref 22–32)
Calcium: 9 mg/dL (ref 8.9–10.3)
Chloride: 97 mmol/L — ABNORMAL LOW (ref 98–111)
Creatinine, Ser: 0.79 mg/dL (ref 0.44–1.00)
GFR calc Af Amer: >60 mL/min (ref >60)
GFR calc non Af Amer: >60 mL/min (ref >60)
Glucose, Bld: 137 mg/dL — ABNORMAL HIGH (ref 70–99)
Potassium: 4 mmol/L (ref 3.5–5.1)
Sodium: 136 mmol/L (ref 135–145)

## 2019-08-11 LAB — SARS CORONAVIRUS 2 BY RT PCR (HOSPITAL ORDER, PERFORMED IN ~~LOC~~ HOSPITAL LAB): SARS Coronavirus 2: NEGATIVE

## 2019-08-11 SURGERY — FIXATION, FRACTURE, INTERTROCHANTERIC, WITH INTRAMEDULLARY ROD
Anesthesia: Monitor Anesthesia Care | Laterality: Left

## 2019-08-11 MED ORDER — ONDANSETRON HCL 4 MG/2ML IJ SOLN: 4.0000 mg | Freq: Four times a day (QID) | INTRAMUSCULAR | Status: DC | PRN

## 2019-08-11 MED ORDER — PHENYLEPHRINE 40 MCG/ML (10ML) SYRINGE FOR IV PUSH (FOR BLOOD PRESSURE SUPPORT)
PREFILLED_SYRINGE | INTRAVENOUS | Status: DC | PRN
  Administered 2019-08-11 (×2): 80 ug via INTRAVENOUS

## 2019-08-11 MED ORDER — ROCURONIUM BROMIDE 10 MG/ML (PF) SYRINGE
PREFILLED_SYRINGE | INTRAVENOUS | Status: AC
  Filled 2019-08-11: qty 10

## 2019-08-11 MED ORDER — ENOXAPARIN SODIUM 40 MG/0.4ML ~~LOC~~ SOLN
40.0000 mg | SUBCUTANEOUS | Status: DC
  Administered 2019-08-12 – 2019-08-15 (×4): 40 mg via SUBCUTANEOUS
  Filled 2019-08-11 (×4): qty 0.4

## 2019-08-11 MED ORDER — EPHEDRINE SULFATE-NACL 50-0.9 MG/10ML-% IV SOSY
PREFILLED_SYRINGE | INTRAVENOUS | Status: DC | PRN
  Administered 2019-08-11: 5 mg via INTRAVENOUS

## 2019-08-11 MED ORDER — METOCLOPRAMIDE HCL 5 MG/ML IJ SOLN: 5.0000 mg | Freq: Three times a day (TID) | INTRAMUSCULAR | Status: DC | PRN

## 2019-08-11 MED ORDER — VANCOMYCIN HCL 1000 MG IV SOLR
INTRAVENOUS | Status: AC
  Filled 2019-08-11: qty 1000

## 2019-08-11 MED ORDER — LEVOTHYROXINE SODIUM 25 MCG PO TABS
25.0000 ug | ORAL_TABLET | Freq: Every day | ORAL | Status: DC
  Administered 2019-08-12 – 2019-08-15 (×4): 25 ug via ORAL
  Filled 2019-08-11 (×4): qty 1

## 2019-08-11 MED ORDER — LACTATED RINGERS IV SOLN: INTRAVENOUS | Status: DC

## 2019-08-11 MED ORDER — OXYCODONE HCL 5 MG PO TABS
5.0000 mg | ORAL_TABLET | ORAL | Status: DC | PRN
  Administered 2019-08-11 – 2019-08-14 (×5): 5 mg via ORAL
  Filled 2019-08-11 (×6): qty 1

## 2019-08-11 MED ORDER — DOCUSATE SODIUM 100 MG PO CAPS
100.0000 mg | ORAL_CAPSULE | Freq: Two times a day (BID) | ORAL | Status: DC
  Administered 2019-08-11 – 2019-08-15 (×8): 100 mg via ORAL
  Filled 2019-08-11 (×8): qty 1

## 2019-08-11 MED ORDER — HYDROXYUREA 500 MG PO CAPS
1000.0000 mg | ORAL_CAPSULE | ORAL | Status: DC
  Administered 2019-08-13 – 2019-08-15 (×2): 1000 mg via ORAL
  Filled 2019-08-11 (×2): qty 2

## 2019-08-11 MED ORDER — ACETAMINOPHEN 10 MG/ML IV SOLN: 1000.0000 mg | Freq: Once | INTRAVENOUS | Status: DC | PRN

## 2019-08-11 MED ORDER — BUPIVACAINE IN DEXTROSE 0.75-8.25 % IT SOLN
INTRATHECAL | Status: DC | PRN
  Administered 2019-08-11: 1 mL via INTRATHECAL

## 2019-08-11 MED ORDER — ONDANSETRON HCL 4 MG PO TABS: 4.0000 mg | ORAL_TABLET | Freq: Four times a day (QID) | ORAL | Status: DC | PRN

## 2019-08-11 MED ORDER — FENTANYL CITRATE (PF) 100 MCG/2ML IJ SOLN
25.0000 ug | INTRAMUSCULAR | Status: DC | PRN
  Administered 2019-08-11: 50 ug via INTRAVENOUS
  Administered 2019-08-11 (×2): 25 ug via INTRAVENOUS

## 2019-08-11 MED ORDER — ACETAMINOPHEN 500 MG PO TABS: 1000.0000 mg | ORAL_TABLET | Freq: Once | ORAL | Status: DC | PRN

## 2019-08-11 MED ORDER — FENTANYL CITRATE (PF) 250 MCG/5ML IJ SOLN
INTRAMUSCULAR | Status: DC | PRN
  Administered 2019-08-11: 50 ug via INTRAVENOUS

## 2019-08-11 MED ORDER — CLINDAMYCIN PHOSPHATE 900 MG/50ML IV SOLN
INTRAVENOUS | Status: AC
  Filled 2019-08-11: qty 50

## 2019-08-11 MED ORDER — MENTHOL 3 MG MT LOZG: 1.0000 | LOZENGE | OROMUCOSAL | Status: DC | PRN

## 2019-08-11 MED ORDER — FENTANYL CITRATE (PF) 100 MCG/2ML IJ SOLN
INTRAMUSCULAR | Status: AC
  Filled 2019-08-11: qty 2

## 2019-08-11 MED ORDER — SUCCINYLCHOLINE CHLORIDE 200 MG/10ML IV SOSY
PREFILLED_SYRINGE | INTRAVENOUS | Status: AC
  Filled 2019-08-11: qty 10

## 2019-08-11 MED ORDER — FENTANYL CITRATE (PF) 100 MCG/2ML IJ SOLN
50.0000 ug | INTRAMUSCULAR | Status: DC | PRN
  Administered 2019-08-11 (×3): 50 ug via INTRAVENOUS
  Filled 2019-08-11 (×3): qty 2

## 2019-08-11 MED ORDER — ONDANSETRON HCL 4 MG/2ML IJ SOLN
4.0000 mg | Freq: Once | INTRAMUSCULAR | Status: AC
  Administered 2019-08-11: 4 mg via INTRAVENOUS
  Filled 2019-08-11: qty 2

## 2019-08-11 MED ORDER — EPHEDRINE 5 MG/ML INJ
INTRAVENOUS | Status: AC
  Filled 2019-08-11: qty 10

## 2019-08-11 MED ORDER — LIDOCAINE 2% (20 MG/ML) 5 ML SYRINGE
INTRAMUSCULAR | Status: AC
  Filled 2019-08-11: qty 5

## 2019-08-11 MED ORDER — ONDANSETRON HCL 4 MG/2ML IJ SOLN
INTRAMUSCULAR | Status: AC
  Filled 2019-08-11: qty 2

## 2019-08-11 MED ORDER — ASPIRIN EC 81 MG PO TBEC: 81.0000 mg | DELAYED_RELEASE_TABLET | Freq: Every day | ORAL | Status: DC

## 2019-08-11 MED ORDER — HYDROXYUREA 500 MG PO CAPS: 500.0000 mg | ORAL_CAPSULE | ORAL | Status: DC

## 2019-08-11 MED ORDER — FENTANYL CITRATE (PF) 250 MCG/5ML IJ SOLN
INTRAMUSCULAR | Status: AC
  Filled 2019-08-11: qty 5

## 2019-08-11 MED ORDER — PHENYLEPHRINE 40 MCG/ML (10ML) SYRINGE FOR IV PUSH (FOR BLOOD PRESSURE SUPPORT)
PREFILLED_SYRINGE | INTRAVENOUS | Status: AC
  Filled 2019-08-11: qty 10

## 2019-08-11 MED ORDER — HYDROXYUREA 500 MG PO CAPS
500.0000 mg | ORAL_CAPSULE | ORAL | Status: DC
  Administered 2019-08-12 – 2019-08-14 (×2): 500 mg via ORAL
  Filled 2019-08-11 (×2): qty 1

## 2019-08-11 MED ORDER — VANCOMYCIN HCL 1000 MG IV SOLR
INTRAVENOUS | Status: DC | PRN
  Administered 2019-08-11: 1000 mg

## 2019-08-11 MED ORDER — HYDROMORPHONE HCL 1 MG/ML IJ SOLN
0.2500 mg | INTRAMUSCULAR | Status: DC | PRN
  Administered 2019-08-11: 0.25 mg via INTRAVENOUS
  Filled 2019-08-11: qty 0.5

## 2019-08-11 MED ORDER — ACETAMINOPHEN 500 MG PO TABS
1000.0000 mg | ORAL_TABLET | Freq: Three times a day (TID) | ORAL | Status: AC
  Administered 2019-08-11 – 2019-08-15 (×12): 1000 mg via ORAL
  Filled 2019-08-11 (×12): qty 2

## 2019-08-11 MED ORDER — PHENYLEPHRINE HCL-NACL 10-0.9 MG/250ML-% IV SOLN
INTRAVENOUS | Status: DC | PRN
  Administered 2019-08-11: 40 ug/min via INTRAVENOUS

## 2019-08-11 MED ORDER — DOCUSATE SODIUM 100 MG PO CAPS: 100.0000 mg | ORAL_CAPSULE | Freq: Two times a day (BID) | ORAL | Status: DC

## 2019-08-11 MED ORDER — METOCLOPRAMIDE HCL 5 MG PO TABS: 5.0000 mg | ORAL_TABLET | Freq: Three times a day (TID) | ORAL | Status: DC | PRN

## 2019-08-11 MED ORDER — SENNOSIDES-DOCUSATE SODIUM 8.6-50 MG PO TABS
1.0000 | ORAL_TABLET | Freq: Every evening | ORAL | Status: DC | PRN
  Administered 2019-08-14 (×2): 1 via ORAL
  Filled 2019-08-11 (×2): qty 1

## 2019-08-11 MED ORDER — DEXAMETHASONE SODIUM PHOSPHATE 10 MG/ML IJ SOLN
INTRAMUSCULAR | Status: AC
  Filled 2019-08-11: qty 1

## 2019-08-11 MED ORDER — MAGNESIUM CITRATE PO SOLN: 1.0000 | Freq: Once | ORAL | Status: DC | PRN

## 2019-08-11 MED ORDER — PROPOFOL 10 MG/ML IV BOLUS
INTRAVENOUS | Status: AC
  Filled 2019-08-11: qty 20

## 2019-08-11 MED ORDER — FENTANYL CITRATE (PF) 100 MCG/2ML IJ SOLN
50.0000 ug | Freq: Once | INTRAMUSCULAR | Status: AC
  Administered 2019-08-11: 50 ug via INTRAVENOUS
  Filled 2019-08-11: qty 2

## 2019-08-11 MED ORDER — PROPOFOL 500 MG/50ML IV EMUL
INTRAVENOUS | Status: DC | PRN
  Administered 2019-08-11: 40 ug/kg/min via INTRAVENOUS

## 2019-08-11 MED ORDER — OXYCODONE HCL 5 MG PO TABS: 5.0000 mg | ORAL_TABLET | Freq: Once | ORAL | Status: DC | PRN

## 2019-08-11 MED ORDER — OXYCODONE HCL 5 MG/5ML PO SOLN: 5.0000 mg | Freq: Once | ORAL | Status: DC | PRN

## 2019-08-11 MED ORDER — CLINDAMYCIN PHOSPHATE 900 MG/50ML IV SOLN: 900.0000 mg | INTRAVENOUS | Status: DC

## 2019-08-11 MED ORDER — POVIDONE-IODINE 10 % EX SWAB
2.0000 "application " | Freq: Once | CUTANEOUS | Status: AC
  Administered 2019-08-11: 2 via TOPICAL

## 2019-08-11 MED ORDER — CLINDAMYCIN PHOSPHATE 600 MG/50ML IV SOLN
600.0000 mg | Freq: Four times a day (QID) | INTRAVENOUS | Status: AC
  Administered 2019-08-12 (×2): 600 mg via INTRAVENOUS
  Filled 2019-08-11 (×3): qty 50

## 2019-08-11 MED ORDER — BISACODYL 5 MG PO TBEC: 5.0000 mg | DELAYED_RELEASE_TABLET | Freq: Every day | ORAL | Status: DC | PRN

## 2019-08-11 MED ORDER — SERTRALINE HCL 50 MG PO TABS
50.0000 mg | ORAL_TABLET | Freq: Every day | ORAL | Status: DC
  Administered 2019-08-12 – 2019-08-15 (×4): 50 mg via ORAL
  Filled 2019-08-11 (×4): qty 1

## 2019-08-11 MED ORDER — PHENOL 1.4 % MT LIQD: 1.0000 | OROMUCOSAL | Status: DC | PRN

## 2019-08-11 MED ORDER — POLYVINYL ALCOHOL 1.4 % OP SOLN
2.0000 [drp] | OPHTHALMIC | Status: DC | PRN
  Filled 2019-08-11: qty 15

## 2019-08-11 MED ORDER — ACETAMINOPHEN 160 MG/5ML PO SOLN: 1000.0000 mg | Freq: Once | ORAL | Status: DC | PRN

## 2019-08-11 MED ORDER — 0.9 % SODIUM CHLORIDE (POUR BTL) OPTIME
TOPICAL | Status: DC | PRN
  Administered 2019-08-11: 1000 mL

## 2019-08-11 MED ORDER — UMECLIDINIUM-VILANTEROL 62.5-25 MCG/INH IN AEPB
1.0000 | INHALATION_SPRAY | Freq: Every day | RESPIRATORY_TRACT | Status: DC
  Administered 2019-08-12 – 2019-08-15 (×4): 1 via RESPIRATORY_TRACT
  Filled 2019-08-11 (×2): qty 14

## 2019-08-11 MED ORDER — ATENOLOL 25 MG PO TABS
25.0000 mg | ORAL_TABLET | Freq: Every day | ORAL | Status: DC
  Administered 2019-08-12 – 2019-08-15 (×3): 25 mg via ORAL
  Filled 2019-08-11 (×3): qty 1

## 2019-08-11 MED ORDER — OXYCODONE HCL 5 MG PO TABS
10.0000 mg | ORAL_TABLET | ORAL | Status: DC | PRN
  Administered 2019-08-13 (×3): 10 mg via ORAL
  Filled 2019-08-11 (×3): qty 2

## 2019-08-11 MED ORDER — POLYETHYLENE GLYCOL 3350 17 G PO PACK
17.0000 g | PACK | Freq: Every day | ORAL | Status: DC
  Administered 2019-08-12 – 2019-08-15 (×3): 17 g via ORAL
  Filled 2019-08-11 (×3): qty 1

## 2019-08-11 MED ORDER — ALBUTEROL SULFATE (2.5 MG/3ML) 0.083% IN NEBU: 3.0000 mL | INHALATION_SOLUTION | Freq: Four times a day (QID) | RESPIRATORY_TRACT | Status: DC | PRN

## 2019-08-11 MED ORDER — CHLORHEXIDINE GLUCONATE 4 % EX LIQD: 60.0000 mL | Freq: Once | CUTANEOUS | Status: DC

## 2019-08-11 SURGICAL SUPPLY — 37 items
APL PRP STRL LF DISP 70% ISPRP (MISCELLANEOUS) ×1
BIT DRILL 4.3MMS DISTAL GRDTED (BIT) IMPLANT
CHLORAPREP W/TINT 26 (MISCELLANEOUS) ×1 IMPLANT
CLSR STERI-STRIP ANTIMIC 1/2X4 (GAUZE/BANDAGES/DRESSINGS) ×1 IMPLANT
COVER MAYO STAND STRL (DRAPES) IMPLANT
COVER PERINEAL POST (MISCELLANEOUS) ×1 IMPLANT
COVER SURGICAL LIGHT HANDLE (MISCELLANEOUS) ×1 IMPLANT
COVER WAND RF STERILE (DRAPES) ×1 IMPLANT
DRAPE STERI IOBAN 125X83 (DRAPES) ×1 IMPLANT
DRESSING AQUACEL AG SP 3.5X4 (GAUZE/BANDAGES/DRESSINGS) IMPLANT
DRILL 4.3MMS DISTAL GRADUATED (BIT) ×2
DRSG AQUACEL AG SP 3.5X4 (GAUZE/BANDAGES/DRESSINGS) ×6
DRSG MEPILEX BORDER 4X4 (GAUZE/BANDAGES/DRESSINGS) ×2 IMPLANT
ELECT REM PT RETURN 15FT ADLT (MISCELLANEOUS) ×1 IMPLANT
GLOVE BIO SURGEON STRL SZ 6.5 (GLOVE) ×2 IMPLANT
GLOVE BIOGEL PI IND STRL 6.5 (GLOVE) IMPLANT
GLOVE BIOGEL PI IND STRL 8 (GLOVE) IMPLANT
GLOVE BIOGEL PI INDICATOR 6.5 (GLOVE) ×1
GLOVE BIOGEL PI INDICATOR 8 (GLOVE) ×2
GLOVE ECLIPSE 8.0 STRL XLNG CF (GLOVE) ×2 IMPLANT
GOWN STRL REUS W/TWL LRG LVL3 (GOWN DISPOSABLE) ×1 IMPLANT
GOWN STRL REUS W/TWL XL LVL3 (GOWN DISPOSABLE) ×3 IMPLANT
GUIDEPIN 3.2X17.5 THRD DISP (PIN) ×2 IMPLANT
GUIDEWIRE BALL NOSE 80CM (WIRE) ×1 IMPLANT
HFN A/R SCREW 90MM (Screw) ×1 IMPLANT
HIP FRAC NAIL LEFT 11X360MM (Orthopedic Implant) ×2 IMPLANT
MANIFOLD NEPTUNE II (INSTRUMENTS) ×1 IMPLANT
NAIL HIP FRAC LEFT 11X360MM (Orthopedic Implant) IMPLANT
NS IRRIG 1000ML POUR BTL (IV SOLUTION) ×1 IMPLANT
PACK GENERAL/GYN (CUSTOM PROCEDURE TRAY) ×1 IMPLANT
SCREW BONE CORTICAL 5.0X40 (Screw) ×1 IMPLANT
SCREW DRILL BIT ANIT ROTATION (BIT) ×1 IMPLANT
SCREW LAG 10.5MMX105MM HFN (Screw) ×1 IMPLANT
SUT MON AB 2-0 CT1 36 (SUTURE) ×1 IMPLANT
SUT VIC AB 0 CT1 27 (SUTURE) ×2
SUT VIC AB 0 CT1 27XBRD ANBCTR (SUTURE) IMPLANT
TOWEL OR NON WOVEN STRL DISP B (DISPOSABLE) ×1 IMPLANT

## 2019-08-11 NOTE — ED Triage Notes (Signed)
Per EMS-mechanical fall-lost her balance-complaining of left hip pain-left elbow abrasion-did not hit head, no LOC-pain with movement

## 2019-08-11 NOTE — H&P (View-Only) (Signed)
ORTHOPAEDIC CONSULTATION  REQUESTING PHYSICIAN: Lavina Hamman, MD  Chief Complaint: Left hip pain  HPI: Kathy Pierce is a 86 y.o. female with past medical history of skin cancer, COPD on oxygen at home, essential thrombocythemia, HTN, lung cancer, osteoporosis, spinal stenosis, and vitamin b12 deficiency.  Patient had a fall this morning when she was taking out the trash. She was walking down the driveway and lost her foot. She fell landed on her left hip. She had immediate pain and was unable to bear weight after the fall. She was transported to [facility] Emergency Department by EMS. Work-up in the ED was significant for left intertrochanteric fracture. Orthopedics was consulted.  Patient sitting in hospital bed. Friend is at her bedside. Pain has improved with IV medications. Complains of left hip pain only. No pain to any other extremity. She denies any LOC after the fall. She denies any previous orthopedic injury. She normally ambulates with a cane.   Past Medical History:  Diagnosis Date  . Cancer (HCC)    Squamous cell carcinoma, skin, basal cell carcinoma  . Complication of anesthesia    wakes up slowly   . COPD (chronic obstructive pulmonary disease) ([place])   . Depression   . Essential thrombocythemia ([place])   . Hypertension   . Lung cancer ([place]) dx'd 2017  . Osteoporosis   . Sciatica of left side 06/16/2012  . Spinal stenosis of lumbar region 06/16/2012  . Vitamin B12 deficiency    Past Surgical History:  Procedure Laterality Date  . APPENDECTOMY    . CATARACT EXTRACTION, BILATERAL Bilateral   . STAPEDECTOMY Right   . TONSILLECTOMY    . VIDEO BRONCHOSCOPY WITH ENDOBRONCHIAL NAVIGATION N/A 02/11/2016   Procedure: VIDEO BRONCHOSCOPY WITH ENDOBRONCHIAL NAVIGATION;  Surgeon: Grace Isaac, MD;  Location: [place];  Service: Thoracic;  Laterality: N/A;  . VIDEO BRONCHOSCOPY WITH ENDOBRONCHIAL ULTRASOUND N/A 02/11/2016   Procedure: VIDEO BRONCHOSCOPY WITH  ENDOBRONCHIAL ULTRASOUND with biopsy of nodes #7 and #10;  Surgeon: Grace Isaac, MD;  Location: [place];  Service: Thoracic;  Laterality: N/A;   Social History   Socioeconomic History  . Marital status: Widowed    Spouse name: Not on file  . Number of children: Not on file  . Years of education: Not on file  . Highest education level: Not on file  Occupational History  . Not on file  Tobacco Use  . Smoking status: Former Smoker    Packs/day: 1.00    Years: 21.00    Pack years: 21.00    Types: Cigarettes    Quit date: 07/10/1976    Years since quitting: 43.1  . Smokeless tobacco: Never Used  Vaping Use  . Vaping Use: Never used  Substance and Sexual Activity  . Alcohol use: No  . Drug use: No  . Sexual activity: Never  Other Topics Concern  . Not on file  Social History Narrative  . Not on file   Social Determinants of Health   Financial Resource Strain:   . Difficulty of Paying Living Expenses:   Food Insecurity:   . Worried About Charity fundraiser in the Last Year:   . Arboriculturist in the Last Year:   Transportation Needs:   . Film/video editor (Medical):   Marland Kitchen Lack of Transportation (Non-Medical):   Physical Activity:   . Days of Exercise per Week:   . Minutes of Exercise per Session:   Stress:   .  Feeling of Stress :   Social Connections:   . Frequency of Communication with Friends and Family:   . Frequency of Social Gatherings with Friends and Family:   . Attends Religious Services:   . Active Member of Clubs or Organizations:   . Attends Archivist Meetings:   Marland Kitchen Marital Status:    Family History  Problem Relation Age of Onset  . Heart attack Father   . Cancer Mother   . Cancer Sister   . Cancer Brother   . Cancer Brother   . Cancer Daughter    Allergies  Allergen Reactions  . Penicillins Hives and Rash    Did it involve swelling of the face/tongue/throat, SOB, or low BP? N Did it involve sudden or severe rash/hives, skin  peeling, or any reaction on the inside of your mouth or nose? Y Did you need to seek medical attention at a hospital or doctor's office? N When did it last happen?Over 10 Years Ago If all above answers are "NO", may proceed with cephalosporin use.    . Codeine Nausea And Vomiting  . Erythromycin Nausea And Vomiting  . Sulfa Drugs Cross Reactors Nausea And Vomiting   Prior to Admission medications   Medication Sig Start Date End Date Taking? Authorizing Provider  albuterol (PROVENTIL HFA;VENTOLIN HFA) 108 (90 Base) MCG/ACT inhaler Inhale 2 puffs into the lungs every 6 (six) hours as needed for wheezing or shortness of breath.    [provider]  aspirin 81 MG tablet Take 81 mg by mouth daily.    [provider]  atenolol (TENORMIN) 25 MG tablet Take 25 mg by mouth daily.  07/26/19   [provider]  benzonatate (TESSALON) 100 MG capsule Take 100 mg by mouth 3 (three) times daily as needed for cough. Patient not taking: Reported on 07/26/2019    [provider]  calcium carbonate (OS-CAL - DOSED IN MG OF ELEMENTAL CALCIUM) 1250 MG tablet Take 1 tablet by mouth 2 (two) times daily with a meal.     [provider]  cyanocobalamin (,VITAMIN B-12,) 1000 MCG/ML injection Inject 1,000 mcg into the muscle every 30 (thirty) days.    [provider]  furosemide (LASIX) 20 MG tablet Take 1 tablet (20 mg total) by mouth every other day. 07/29/19   Nita Sells, MD  hydroxyurea (HYDREA) 500 MG capsule Take 500 mg by mouth every other day. May take with food to minimize GI side effects. Reports alternating one day one day then two tablets the next.    [provider]  Hypromellose (ARTIFICIAL TEARS OP) Place 1 drop into both eyes as needed (for dry eyes).    [provider]  levothyroxine (SYNTHROID) 25 MCG tablet Take 25 mcg by mouth daily. 08/24/18   [provider]  meclizine (ANTIVERT) 25 MG tablet Take 25 mg by  mouth 3 (three) times daily as needed for dizziness.    [provider]  Multiple Vitamins-Minerals (PRESERVISION AREDS PO) Take 1 tablet by mouth daily.    [provider]  omeprazole (PRILOSEC) 20 MG capsule Take 20 mg by mouth every morning. Patient not taking: Reported on 07/26/2019 07/11/19   [provider]  sertraline (ZOLOFT) 100 MG tablet Take 100 mg by mouth daily.  07/26/19   [provider]  traMADol (ULTRAM) 50 MG tablet Take 50 mg by mouth every 8 (eight) hours as needed for moderate pain.  07/25/15   [provider]  umeclidinium-vilanterol (ANORO ELLIPTA)  62.5-25 MCG/INH AEPB Inhale 1 puff into the lungs daily. 06/28/19   Marshell Garfinkel, MD   DG Chest 1 View  Result Date: 08/11/2019 CLINICAL DATA:  Pain following fall EXAM: CHEST  1 VIEW COMPARISON:  July 26, 2019 chest radiograph and July 27, 2019 chest CT FINDINGS: There is underlying emphysematous change. There is scarring in the left mid lung region with nearby fiducial clips, indicative of post radiation therapy fibrosis. No appreciable edema or airspace opacity. Heart is slightly enlarged with pulmonary vascularity reflective of underlying emphysematous change. No adenopathy. No bone lesions. IMPRESSION: Scarring with post radiation therapy fibrosis on the left, stable. Underlying emphysematous changes throughout the lungs, better delineated on recent CT. No frank edema or airspace opacity. Stable cardiac prominence. No adenopathy evident by radiography. Electronically Signed   By: Lowella Grip III M.D.   On: 08/11/2019 11:21   CT Head Wo Contrast  Result Date: 08/11/2019 CLINICAL DATA:  Fall EXAM: CT HEAD WITHOUT CONTRAST TECHNIQUE: Contiguous axial images were obtained from the base of the skull through the vertex without intravenous contrast. COMPARISON:  None. FINDINGS: Brain: There is no acute intracranial hemorrhage, mass effect, or edema. There is no acute appearing loss of  gray-white differentiation. Chronic left parietal infarct is present. Patchy and confluent areas of hypoattenuation in the supratentorial white matter are nonspecific but probably reflect moderate chronic microvascular ischemic changes. There is no extra-axial fluid collection. Prominence of the ventricles and sulci reflects mild generalized parenchymal volume loss. Vascular: There is atherosclerotic calcification at the skull base. Skull: Calvarium is unremarkable. Sinuses/Orbits: No acute finding. Other: None. IMPRESSION: No evidence of acute intracranial injury. Chronic/nonemergent findings detailed above. Electronically Signed   By: Macy Mis M.D.   On: 08/11/2019 13:34   CT Cervical Spine Wo Contrast  Result Date: 08/11/2019 CLINICAL DATA:  Fall EXAM: CT CERVICAL SPINE WITHOUT CONTRAST TECHNIQUE: Multidetector CT imaging of the cervical spine was performed without intravenous contrast. Multiplanar CT image reconstructions were also generated. COMPARISON:  None. FINDINGS: Alignment: Anteroposterior alignment is maintained. Skull base and vertebrae: No acute cervical spine fracture. Vertebral body heights are preserved apart from degenerative endplate irregularity. No destructive osseous lesion. Soft tissues and spinal canal: No prevertebral fluid or swelling. No visible canal hematoma. Disc levels: Multilevel degenerative changes are present including disc space narrowing, endplate osteophytes, and facet and uncovertebral hypertrophy. Mild canal stenosis. Multilevel foraminal stenosis. Upper chest: Emphysema and scarring at the lung apices. Other: Mild calcified plaque at the ICA origins. IMPRESSION: No acute cervical spine fracture. Electronically Signed   By: Macy Mis M.D.   On: 08/11/2019 13:38   DG Hip Unilat With Pelvis 2-3 Views Left  Result Date: 08/11/2019 CLINICAL DATA:  Left hip pain after a fall today. EXAM: DG HIP (WITH OR WITHOUT PELVIS) 2-3V LEFT COMPARISON:  None. FINDINGS: The  patient has a comminuted intertrochanteric fracture with subtrochanteric extension of the left hip. The lesser trochanter is a separate fragment. The femoral head is located. No other acute abnormality. IMPRESSION: Comminuted left intertrochanteric fracture with subtrochanteric extension. Electronically Signed   By: Inge Rise M.D.   On: 08/11/2019 11:22   Family History Reviewed and non-contributory, no pertinent history of problems with bleeding or anesthesia      Review of Systems 14 system ROS conducted and negative except for that noted in HPI   OBJECTIVE  Vitals: Patient Vitals for the past 8 hrs:  BP Temp Temp src Pulse Resp SpO2  08/11/19 1330 102/80 -- --  65 18 100 %  08/11/19 1315 (!) 103/57 -- -- 67 18 100 %  08/11/19 1159 -- 98 F (36.7 C) Oral -- -- --  08/11/19 1157 129/65 -- -- 65 17 100 %   General: Elderly female, alert, no acute distress Cardiovascular: Warm extremities noted Respiratory: No cyanosis, on nasal cannula GI: No organomegaly, abdomen is soft and non-tender Skin: No lesions in the area of chief complaint other than those listed below in MSK exam.  Neurologic: Sensation intact distally save for the below mentioned MSK exam Psychiatric: Patient is competent for consent with normal mood and affect Lymphatic: No swelling obvious and reported other than the area involved in the exam below  Extremities  RUE and LUE: actively moves upper extremities without pain. Non-tender to palpation throughout upper extremity. Neurovascularly intact. Superficial abrasion left elbow.  RLE: actively moves RLE without pain. Non-tender to palpation throughout RLE. Neurovascularly intact. LLE: Shortened and externally rotated.  ROM deferred. + GS/TA/EHL. Sensation intact in DP/SP/S/S/P distributions. 2+ DP pulse with warm and well perfused digits. Compartments soft and compressible, with no pain on passive stretch. Skin cancer removal site covered with bandage and kerlix  on left ankle.    Test Results Imaging Left hip demonstrates left intertrochanteric femur fracture  Labs cbc Recent Labs    08/11/19 1157  WBC 12.5*  HGB 12.4  HCT 38.3  PLT 649*    Labs inflam No results for input(s): CRP in the last 72 hours.  Invalid input(s): ESR  Labs coag No results for input(s): INR, PTT in the last 72 hours.  Invalid input(s): PT  Recent Labs    08/11/19 1157  NA 136  K 4.0  CL 97*  CO2 27  GLUCOSE 137*  BUN 23  CREATININE 0.79  CALCIUM 9.0     ASSESSMENT AND PLAN: 86 y.o. female with the following: left intertrochanteric femur fracture  Orthopedics recommends admission to a medical service and we will provide consultation and follow along  Discussed the nature of the injury as well as the care with the patient as well as the family.  Discussed options and non-operative versus operative measures. Nonoperative measures are not well tolerated as patient's on bedrest for extended periods of time tend to develop secondary issues such as pneumonia, urinary tract infections, bedsores and delirium.  Based on this our recommendation is for operative measures.  Understanding this the patient/family elected to proceed with operative measures.  The risks and benefits of  surgical intervention including infection, bleeding, nerve injury, periprosthetic fracture, the need for revision surgery, leg length discrepancy, gait change, blood clots, cardiopulmonary complications, morbidity, mortality, among others, and they were willing to proceed.     - Plan: Operative fixation this evening - NPO -Medicine team to admit and perform pre-op clearance - Weight Bearing Status/Activity: will ammend WB status postop, bedrest for now - PT/OT post op - VTE Prophylaxis: SCDs for now - Pain control: PRN pain medications - Dispo: Likely to require Rehab or SNF placement upon discharge.  - Contact information: Dr. Ophelia Charter, Noemi Chapel, PA-C  [place], [place] 08/11/2019

## 2019-08-11 NOTE — ED Provider Notes (Signed)
[facility] DEPT Provider Note   CSN: [id number] Arrival date & time: 08/11/19  1034     History Chief Complaint  Patient presents with  . Fall    Kathy Pierce is a 86 y.o. female past medical history of COPD, essential thrombocytopenia, hypertension who presents for evaluation of left hip pain after mechanical fall that occurred earlier this morning. She reports that about 830-9 AM, she was taking the trash out and was walking down the driveway. She reports that she lost her footing which caused her to fall and land on her left hip. She does not think she had any head injury or LOC. She is not on any blood thinners. She reports that she could not get up and could not put any weight on her left lower leg. A neighbor came over and helped her and called EMS. Patient reports that if she sits still, the pain is better but if she starts moving her left lower extremity, the pain becomes worse. She did not have any preceding chest pain or dizziness that made her fall. She denies any neck pain, difficulty breathing, abdominal pain, numbness/weakness of arms or legs. She does ambulate most the time by herself. She reports occasionally, she will he have to use a cane.  The history is provided by the patient.       Past Medical History:  Diagnosis Date  . Cancer (HCC)    Squamous cell carcinoma, skin, basal cell carcinoma  . Complication of anesthesia    wakes up slowly   . COPD (chronic obstructive pulmonary disease) ([place])   . Depression   . Essential thrombocythemia ([place])   . Hypertension   . Lung cancer ([place]) dx'd 2017  . Osteoporosis   . Sciatica of left side 06/16/2012  . Spinal stenosis of lumbar region 06/16/2012  . Vitamin B12 deficiency     Patient Active Problem List   Diagnosis Date Noted  . Hip fracture ([place]) 08/11/2019  . Congestive heart failure (CHF) ([place]) 07/26/2019  . Chronic obstructive pulmonary disease ([place]) 12/18/2017  . Essential  hypertension 12/18/2017  . Major depression 12/18/2017  . Osteoporosis 12/18/2017  . Vitamin B deficiency 12/18/2017  . Essential thrombocythemia ([place]) 11/18/2016  . 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 Conductive hearing loss in right ear 04/25/2015  . Cerumen impaction 04/25/2015  . Sciatica of left side 06/16/2012  . Spinal stenosis of lumbar region 06/16/2012    Past Surgical History:  Procedure Laterality Date  . APPENDECTOMY    . CATARACT EXTRACTION, BILATERAL Bilateral   . STAPEDECTOMY Right   . TONSILLECTOMY    . VIDEO BRONCHOSCOPY WITH ENDOBRONCHIAL NAVIGATION N/A 02/11/2016   Procedure: VIDEO BRONCHOSCOPY WITH ENDOBRONCHIAL NAVIGATION;  Surgeon: Grace Isaac, MD;  Location: [facility];  Service: Thoracic;  Laterality: N/A;  . VIDEO BRONCHOSCOPY WITH ENDOBRONCHIAL ULTRASOUND N/A 02/11/2016   Procedure: VIDEO BRONCHOSCOPY WITH ENDOBRONCHIAL ULTRASOUND with biopsy of nodes #7 and #10;  Surgeon: Grace Isaac, MD;  Location: [facility];  Service: Thoracic;  Laterality: N/A;     OB History   No obstetric history on file.     Family History  Problem Relation Age of Onset  . Heart attack Father   . Cancer Mother   . Cancer Sister   . Cancer Brother   . Cancer Brother   . Cancer Daughter     Social History   Tobacco Use  . Smoking status: Former Smoker  Packs/day: 1.00    Years: 21.00    Pack years: 21.00    Types: Cigarettes    Quit date: 07/10/1976    Years since quitting: 43.1  . Smokeless tobacco: Never Used  Vaping Use  . Vaping Use: Never used  Substance Use Topics  . Alcohol use: No  . Drug use: No    Home Medications Prior to Admission medications   Medication Sig Start Date End Date Taking? Authorizing Provider  albuterol (PROVENTIL HFA;VENTOLIN HFA) 108 (90 Base) MCG/ACT inhaler Inhale 2 puffs into the lungs every 6 (six) hours as needed for wheezing or shortness of breath.    [provider]  aspirin 81 MG  tablet Take 81 mg by mouth daily.    [provider]  atenolol (TENORMIN) 25 MG tablet Take 25 mg by mouth daily.  07/26/19   [provider]  benzonatate (TESSALON) 100 MG capsule Take 100 mg by mouth 3 (three) times daily as needed for cough. Patient not taking: Reported on 07/26/2019    [provider]  calcium carbonate (OS-CAL - DOSED IN MG OF ELEMENTAL CALCIUM) 1250 MG tablet Take 1 tablet by mouth 2 (two) times daily with a meal.     [provider]  cyanocobalamin (,VITAMIN B-12,) 1000 MCG/ML injection Inject 1,000 mcg into the muscle every 30 (thirty) days.    [provider]  furosemide (LASIX) 20 MG tablet Take 1 tablet (20 mg total) by mouth every other day. 07/29/19   Nita Sells, MD  hydroxyurea (HYDREA) 500 MG capsule Take 500 mg by mouth every other day. May take with food to minimize GI side effects. Reports alternating one day one day then two tablets the next.    [provider]  Hypromellose (ARTIFICIAL TEARS OP) Place 1 drop into both eyes as needed (for dry eyes).    [provider]  levothyroxine (SYNTHROID) 25 MCG tablet Take 25 mcg by mouth daily. 08/24/18   [provider]  meclizine (ANTIVERT) 25 MG tablet Take 25 mg by mouth 3 (three) times daily as needed for dizziness.    [provider]  Multiple Vitamins-Minerals (PRESERVISION AREDS PO) Take 1 tablet by mouth daily.    [provider]  omeprazole (PRILOSEC) 20 MG capsule Take 20 mg by mouth every morning. Patient not taking: Reported on 07/26/2019 07/11/19   [provider]  sertraline (ZOLOFT) 100 MG tablet Take 100 mg by mouth daily.  07/26/19   [provider]  traMADol (ULTRAM) 50 MG tablet Take 50 mg by mouth every 8 (eight) hours as needed for moderate pain.  07/25/15   [provider]  umeclidinium-vilanterol (ANORO ELLIPTA) 62.5-25 MCG/INH AEPB Inhale 1 puff into the lungs daily. 06/28/19   Mannam,  Hart Robinsons, MD    Allergies    Penicillins, Codeine, Erythromycin, and Sulfa drugs cross reactors  Review of Systems   Review of Systems  Constitutional: Negative for fever.  Respiratory: Negative for cough and shortness of breath.   Cardiovascular: Negative for chest pain.  Gastrointestinal: Negative for abdominal pain, nausea and vomiting.  Genitourinary: Negative for dysuria and hematuria.  Musculoskeletal: Negative for neck pain.       LLE pain  Skin: Positive for wound.  Neurological: Negative for weakness, numbness and headaches.  All other systems reviewed and are negative.   Physical Exam Updated Vital Signs BP (!) 103/57   Pulse 67   Temp 98 F (36.7 C) (Oral)   Resp 18  SpO2 100%   Physical Exam Vitals and nursing note reviewed.  Constitutional:      Appearance: Normal appearance. She is well-developed.  HENT:     Head: Normocephalic and atraumatic.     Comments: No tenderness to palpation of skull. No deformities or crepitus noted. No open wounds, abrasions or lacerations.  Eyes:     General: Lids are normal.     Conjunctiva/sclera: Conjunctivae normal.     Pupils: Pupils are equal, round, and reactive to light.     Comments: PERRL. EOMs intact. No nystagmus. No neglect.   Neck:     Comments: Full flexion/extension and lateral movement of neck fully intact. No bony midline tenderness. No deformities or crepitus.  Cardiovascular:     Rate and Rhythm: Normal rate and regular rhythm.     Pulses: Normal pulses.          Dorsalis pedis pulses are 2+ on the right side and 2+ on the left side.     Heart sounds: Normal heart sounds. No murmur heard.  No friction rub. No gallop.   Pulmonary:     Effort: Pulmonary effort is normal.     Breath sounds: Normal breath sounds.  Abdominal:     Palpations: Abdomen is soft. Abdomen is not rigid.     Tenderness: There is no abdominal tenderness. There is no guarding.     Comments: Abdomen is soft, non-distended,  non-tender. No rigidity, No guarding. No peritoneal signs.  Musculoskeletal:        General: Normal range of motion.     Cervical back: Full passive range of motion without pain.     Comments: LLE with shortening and external rotation.Tenderness palpation noted of the proximal left femur with overlying soft tissue swelling. Compartments are soft. Limited range of motion secondary to pain. No bony tenderness noted to left knee, left tib-fib, left ankle. Full range of motion of right lower extremity without any difficulty. No bony tenderness of the bilateral upper extremities. Full range of motion of bilateral upper extremities with any difficulty.  Skin:    General: Skin is warm and dry.     Capillary Refill: Capillary refill takes less than 2 seconds.     Comments: Scattered abrasions noted to left upper extremity. Good distal cap refill.  LLE is not dusky in appearance or cool to touch.  Neurological:     Mental Status: She is alert and oriented to person, place, and time.     Comments: Cranial nerves III-XII intact Follows commands, Moves all extremities  5/5 strength to BUE and RLE. Difficulty assessing strength of left lower extremity secondary to pain. Sensation intact throughout all major nerve distribution No slurred speech. No facial droop.   Psychiatric:        Speech: Speech normal.     ED Results / Procedures / Treatments   Labs (all labs ordered are listed, but only abnormal results are displayed) Labs Reviewed  BASIC METABOLIC PANEL - Abnormal; Notable for the following components:      Result Value   Chloride 97 (*)    Glucose, Bld 137 (*)    All other components within normal limits  CBC WITH DIFFERENTIAL/PLATELET - Abnormal; Notable for the following components:   WBC 12.5 (*)    RBC 3.37 (*)    MCV 113.6 (*)    MCH 36.8 (*)    Platelets 649 (*)    Neutro Abs 10.6 (*)    Lymphs Abs 0.3 (*)  Monocytes Absolute 1.3 (*)    Abs Immature Granulocytes 0.08 (*)     All other components within normal limits  SARS CORONAVIRUS 2 BY RT PCR [facility], [place] LAB)    EKG None  Radiology DG Chest 1 View  Result Date: 08/11/2019 CLINICAL DATA:  Pain following fall EXAM: CHEST  1 VIEW COMPARISON:  July 26, 2019 chest radiograph and July 27, 2019 chest CT FINDINGS: There is underlying emphysematous change. There is scarring in the left mid lung region with nearby fiducial clips, indicative of post radiation therapy fibrosis. No appreciable edema or airspace opacity. Heart is slightly enlarged with pulmonary vascularity reflective of underlying emphysematous change. No adenopathy. No bone lesions. IMPRESSION: Scarring with post radiation therapy fibrosis on the left, stable. Underlying emphysematous changes throughout the lungs, better delineated on recent CT. No frank edema or airspace opacity. Stable cardiac prominence. No adenopathy evident by radiography. Electronically Signed   By: Lowella Grip III M.D.   On: 08/11/2019 11:21   DG Hip Unilat With Pelvis 2-3 Views Left  Result Date: 08/11/2019 CLINICAL DATA:  Left hip pain after a fall today. EXAM: DG HIP (WITH OR WITHOUT PELVIS) 2-3V LEFT COMPARISON:  None. FINDINGS: The patient has a comminuted intertrochanteric fracture with subtrochanteric extension of the left hip. The lesser trochanter is a separate fragment. The femoral head is located. No other acute abnormality. IMPRESSION: Comminuted left intertrochanteric fracture with subtrochanteric extension. Electronically Signed   By: Inge Rise M.D.   On: 08/11/2019 11:22    Procedures Procedures (including critical care time)  Medications Ordered in ED Medications  fentaNYL (SUBLIMAZE) injection 50 mcg (50 mcg Intravenous Given 08/11/19 1322)  fentaNYL (SUBLIMAZE) injection 50 mcg (50 mcg Intravenous Given 08/11/19 1159)  ondansetron (ZOFRAN) injection 4 mg (4 mg Intravenous Given 08/11/19 1159)    ED Course  I  have reviewed the triage vital signs and the nursing notes.  Pertinent labs & imaging results that were available during my care of the patient were reviewed by me and considered in my medical decision making (see chart for details).    MDM Rules/Calculators/A&P                          86 year old female who presents for evaluation of left hip pain status post mechanical fall that occurred this morning. She was taking out trash and lost her footing, causing her to fall. No preceding chest pain or dizziness. She does not think she had any head injury or LOC. Since then, she has been unable to ambulate bear weight on her left lower extremity secondary to pain. On initially arrival, she is afebrile, nontoxic-appearing. Vital signs are stable. She is neurovascularly intact. Patient with tenderness palpation noted of the right hip with limited range of motion. Concern for fracture versus dislocation. Additionally, will plan for imaging of head and neck given that she did fall.  CBC shows slight leukocytosis of 12.5.  Hemoglobin stable at 12.4.  BMP shows normal pain and creatinine.  Covid negative.  Chest x-ray shows scarring with postradiation therapy fibrosis on the left. She has underlying emphysematous changes throughout the lung.  X-ray shows a comminuted left intertrochanteric fracture with subtrochanteric extension.  CT head shows no evidence of acute intracranial abnormality.  CT cervical spine negative for any acute bony abnormality.  Discussed patient with Dr. Griffin Basil (Ortho). Recommends hospitalist admission with plans to transfer to [facility]. Requests that patient be  NPO for possible surgery this PM.   Discussed patient with Dr. Posey Pronto (hospitalist) who accepts patient for admission.   Portions of this note were generated with [employer]. Dictation errors may occur despite best attempts at proofreading.  Final Clinical Impression(s) / ED Diagnoses Final diagnoses:  Closed  nondisplaced intertrochanteric fracture of left femur, initial encounter [facility])    Rx / [facility] Orders ED Discharge Orders    None       Desma Mcgregor 08/11/19 1354    Charlesetta Shanks, MD 08/12/19 1219

## 2019-08-11 NOTE — Interval H&P Note (Signed)
History and Physical Interval Note:  08/11/2019 5:35 PM  Kathy Pierce  has presented today for surgery, with the diagnosis of FALL, HIP FRACTURE.  The various methods of treatment have been discussed with the patient and family. After consideration of risks, benefits and other options for treatment, the patient has consented to  Procedure(s): INTRAMEDULLARY (IM) NAIL INTERTROCHANTRIC (Left) as a surgical intervention.  The patient's history has been reviewed, patient examined, no change in status, stable for surgery.  I have reviewed the patient's chart and labs.  Questions were answered to the patient's satisfaction.     Hiram Gash

## 2019-08-11 NOTE — H&P (Signed)
[facility] History and Physical   Patient: Kathy Pierce BOF:[id number]   PCP: London Pepper, MD DOB: Feb 18, 1925   DOA: 08/11/2019   DOS: 08/11/2019   DOS: the patient was seen and examined on 08/11/2019  Patient coming from: The patient is coming from Home  Chief Complaint: Mechanical fall  HPI: Kathy Pierce is a 86 y.o. female with Past medical history of recent admission for HFpEF, COPD, HTN, prior history of lung cancer, left leg squamous cell carcinoma, depression, osteoporosis, B12 deficiency, underweight, hypothyroidism. Patient presents with a mechanical fall.  She was trying to take out her trash can and walking on the driveway.  She lost her footing and fell on the.  Denies any head injury or neck injury or passing out episodes. Denies any headache chest pain or shortness of breath prior to this episode. Patient was recently hospitalized in the end of June for CHF exacerbation and mentions that her breathing has been stable.  She is able to walk around the house without any shortness of breath.  No nausea no vomiting no fever no chills.  No diarrhea. At the time of my evaluation no tingling no numbness no focal deficit reported by the patient.  No fever no chills.  No recent change in medication at the recent hospitalization.  ED Course: Presents with a mechanical fall.  CT head and CT C-spine unremarkable.  X-ray hip shows left hip fracture.  Orthopedic were consulted.  Initial plan was to transfer the patient to [facility].  Later on it was decided that the patient will get surgery here at [facility].  Hospitalist were consulted for admission.  At her baseline ambulates with assistance independent for most of her ADL;  manages her medication on her own.  Review of Systems: as mentioned in the history of present illness.  All other systems reviewed and are negative.  Past Medical History:  Diagnosis Date  . Cancer (HCC)    Squamous cell carcinoma, skin, basal  cell carcinoma  . Complication of anesthesia    wakes up slowly   . COPD (chronic obstructive pulmonary disease) ([place])   . Depression   . Dyspnea   . Essential thrombocythemia ([place])   . Hypertension   . Lung cancer ([place]) dx'd 2017  . Osteoporosis   . Sciatica of left side 06/16/2012  . Spinal stenosis of lumbar region 06/16/2012  . Vitamin B12 deficiency    Past Surgical History:  Procedure Laterality Date  . APPENDECTOMY    . CATARACT EXTRACTION, BILATERAL Bilateral   . STAPEDECTOMY Right   . TONSILLECTOMY    . VIDEO BRONCHOSCOPY WITH ENDOBRONCHIAL NAVIGATION N/A 02/11/2016   Procedure: VIDEO BRONCHOSCOPY WITH ENDOBRONCHIAL NAVIGATION;  Surgeon: Grace Isaac, MD;  Location: [place];  Service: Thoracic;  Laterality: N/A;  . VIDEO BRONCHOSCOPY WITH ENDOBRONCHIAL ULTRASOUND N/A 02/11/2016   Procedure: VIDEO BRONCHOSCOPY WITH ENDOBRONCHIAL ULTRASOUND with biopsy of nodes #7 and #10;  Surgeon: Grace Isaac, MD;  Location: [place];  Service: Thoracic;  Laterality: N/A;   Social History:  reports that she quit smoking about 43 years ago. Her smoking use included cigarettes. She has a 21.00 pack-year smoking history. She has never used smokeless tobacco. She reports that she does not drink alcohol and does not use drugs.  Allergies  Allergen Reactions  . Penicillins Hives and Rash    Did it involve swelling of the face/tongue/throat, SOB, or low BP? N Did it involve sudden or severe rash/hives, skin  peeling, or any reaction on the inside of your mouth or nose? Y Did you need to seek medical attention at a hospital or doctor's office? N When did it last happen?Over 68 Years Ago If all above answers are "NO", may proceed with cephalosporin use.    . Codeine Nausea And Vomiting  . Erythromycin Nausea And Vomiting  . Sulfa Drugs Cross Reactors Nausea And Vomiting   Family history reviewed and not pertinent Family History  Problem Relation Age of Onset  . Heart attack Father     . Cancer Mother   . Cancer Sister   . Cancer Brother   . Cancer Brother   . Cancer Daughter      Prior to Admission medications   Medication Sig Start Date End Date Taking? Authorizing Provider  albuterol (PROVENTIL HFA;VENTOLIN HFA) 108 (90 Base) MCG/ACT inhaler Inhale 2 puffs into the lungs every 6 (six) hours as needed for wheezing or shortness of breath.   Yes [provider]  aspirin 81 MG tablet Take 81 mg by mouth daily.   Yes [provider]  atenolol (TENORMIN) 25 MG tablet Take 25 mg by mouth daily.  07/26/19  Yes [provider]  calcium carbonate (OS-CAL - DOSED IN MG OF ELEMENTAL CALCIUM) 1250 MG tablet Take 1 tablet by mouth every evening.    Yes [provider]  cyanocobalamin (,VITAMIN B-12,) 1000 MCG/ML injection Inject 1,000 mcg into the muscle every 30 (thirty) days.   Yes [provider]  furosemide (LASIX) 20 MG tablet Take 1 tablet (20 mg total) by mouth every other day. 07/29/19  Yes Nita Sells, MD  hydroxyurea (HYDREA) 500 MG capsule Take 500-1,000 mg by mouth every other day. May take with food to minimize GI side effects. Alternate Take 1 capsule (500 mg) and Take 2 capsules (1000 mg) Other Day   Yes [provider]  Hypromellose (ARTIFICIAL TEARS OP) Place 1 drop into both eyes as needed (for dry eyes).   Yes [provider]  levothyroxine (SYNTHROID) 25 MCG tablet Take 25 mcg by mouth daily. 08/24/18  Yes [provider]  meclizine (ANTIVERT) 25 MG tablet Take 25 mg by mouth 3 (three) times daily as needed for dizziness.   Yes [provider]  Multiple Vitamins-Minerals (PRESERVISION AREDS PO) Take 1 tablet by mouth daily.   Yes [provider]  sertraline (ZOLOFT) 100 MG tablet Take 50 mg by mouth daily.  07/26/19  Yes [provider]  traMADol (ULTRAM) 50 MG tablet Take 50 mg by mouth every 8 (eight) hours as needed for moderate pain.  07/25/15  Yes [provider]  umeclidinium-vilanterol (ANORO ELLIPTA) 62.5-25 MCG/INH AEPB Inhale 1 puff into the lungs daily. 06/28/19  Yes Mannam, Praveen, MD  benzonatate (TESSALON) 100 MG capsule Take 100 mg by mouth 3 (three) times daily as needed for cough. Patient not taking: Reported on 07/26/2019    [provider]  omeprazole (PRILOSEC) 20 MG capsule Take 20 mg by mouth every morning. Patient not taking: Reported on 07/26/2019 07/11/19   [provider]    Physical Exam: Vitals:   08/11/19 1330 08/11/19 1530 08/11/19 1646 08/11/19 1726  BP: 102/80 109/60 121/62 108/60  Pulse: 65 65 64 66  Resp: 18 18 18 18   Temp:   97.8 F (36.6 C)   TempSrc:   Oral   SpO2: 100% 100% 100% 100%    General: alert and oriented to time, place, and person. Appear in moderate  distress, affect appropriate Eyes: PERRL, Conjunctiva normal ENT: Oral Mucosa Clear, moist  Neck: no JVD, no Abnormal Mass Or lumps Cardiovascular: S1 and S2 Present, no Murmur, peripheral pulses symmetrical Respiratory: good respiratory effort, Bilateral Air entry equal and Decreased, no signs of accessory muscle use, faint basal crackles, no wheezes Abdomen: Bowel Sound present, Soft and no tenderness, no hernia Skin: no rashes  Extremities: no Pedal edema, no calf tenderness Neurologic: without any new focal findings Gait not checked due to patient safety concerns  Data Reviewed: I have personally reviewed and interpreted labs, imaging as discussed below.  CBC: Recent Labs  Lab 08/11/19 1157  WBC 12.5*  NEUTROABS 10.6*  HGB 12.4  HCT 38.3  MCV 113.6*  PLT 341*   Basic Metabolic Panel: Recent Labs  Lab 08/11/19 1157  NA 136  K 4.0  CL 97*  CO2 27  GLUCOSE 137*  BUN 23  CREATININE 0.79  CALCIUM 9.0   GFR: Estimated Creatinine Clearance: 35.6 mL/min (by C-G formula based on SCr of 0.79 mg/dL). Liver Function Tests: No results for input(s): AST, ALT, ALKPHOS, BILITOT, PROT, ALBUMIN in the last 168  hours. No results for input(s): LIPASE, AMYLASE in the last 168 hours. No results for input(s): AMMONIA in the last 168 hours. Coagulation Profile: No results for input(s): INR, PROTIME in the last 168 hours. Cardiac Enzymes: No results for input(s): CKTOTAL, CKMB, CKMBINDEX, TROPONINI in the last 168 hours. BNP (last 3 results) No results for input(s): PROBNP in the last 8760 hours. HbA1C: No results for input(s): HGBA1C in the last 72 hours. CBG: No results for input(s): GLUCAP in the last 168 hours. Lipid Profile: No results for input(s): CHOL, HDL, LDLCALC, TRIG, CHOLHDL, LDLDIRECT in the last 72 hours. Thyroid Function Tests: No results for input(s): TSH, T4TOTAL, FREET4, T3FREE, THYROIDAB in the last 72 hours. Anemia Panel: No results for input(s): VITAMINB12, FOLATE, FERRITIN, TIBC, IRON, RETICCTPCT in the last 72 hours. Urine analysis: No results found for: COLORURINE, APPEARANCEUR, [place], [place], [place], [place], [place], [place], [place], [place], NITRITE, LEUKOCYTESUR  Radiological Exams on Admission: DG Chest 1 View  Result Date: 08/11/2019 CLINICAL DATA:  Pain following fall EXAM: CHEST  1 VIEW COMPARISON:  July 26, 2019 chest radiograph and July 27, 2019 chest CT FINDINGS: There is underlying emphysematous change. There is scarring in the left mid lung region with nearby fiducial clips, indicative of post radiation therapy fibrosis. No appreciable edema or airspace opacity. Heart is slightly enlarged with pulmonary vascularity reflective of underlying emphysematous change. No adenopathy. No bone lesions. IMPRESSION: Scarring with post radiation therapy fibrosis on the left, stable. Underlying emphysematous changes throughout the lungs, better delineated on recent CT. No frank edema or airspace opacity. Stable cardiac prominence. No adenopathy evident by radiography. Electronically Signed   By: Lowella Grip III M.D.   On: 08/11/2019 11:21   CT Head Wo  Contrast  Result Date: 08/11/2019 CLINICAL DATA:  Fall EXAM: CT HEAD WITHOUT CONTRAST TECHNIQUE: Contiguous axial images were obtained from the base of the skull through the vertex without intravenous contrast. COMPARISON:  None. FINDINGS: Brain: There is no acute intracranial hemorrhage, mass effect, or edema. There is no acute appearing loss of gray-white differentiation. Chronic left parietal infarct is present. Patchy and confluent areas of hypoattenuation in the supratentorial white matter are nonspecific but probably reflect moderate chronic microvascular ischemic changes. There is no extra-axial fluid collection. Prominence of the ventricles and sulci reflects mild generalized parenchymal volume loss. Vascular: There is atherosclerotic calcification  at the skull base. Skull: Calvarium is unremarkable. Sinuses/Orbits: No acute finding. Other: None. IMPRESSION: No evidence of acute intracranial injury. Chronic/nonemergent findings detailed above. Electronically Signed   By: Macy Mis M.D.   On: 08/11/2019 13:34   CT Cervical Spine Wo Contrast  Result Date: 08/11/2019 CLINICAL DATA:  Fall EXAM: CT CERVICAL SPINE WITHOUT CONTRAST TECHNIQUE: Multidetector CT imaging of the cervical spine was performed without intravenous contrast. Multiplanar CT image reconstructions were also generated. COMPARISON:  None. FINDINGS: Alignment: Anteroposterior alignment is maintained. Skull base and vertebrae: No acute cervical spine fracture. Vertebral body heights are preserved apart from degenerative endplate irregularity. No destructive osseous lesion. Soft tissues and spinal canal: No prevertebral fluid or swelling. No visible canal hematoma. Disc levels: Multilevel degenerative changes are present including disc space narrowing, endplate osteophytes, and facet and uncovertebral hypertrophy. Mild canal stenosis. Multilevel foraminal stenosis. Upper chest: Emphysema and scarring at the lung apices. Other: Mild  calcified plaque at the ICA origins. IMPRESSION: No acute cervical spine fracture. Electronically Signed   By: Macy Mis M.D.   On: 08/11/2019 13:38   DG Hip Unilat With Pelvis 2-3 Views Left  Result Date: 08/11/2019 CLINICAL DATA:  Left hip pain after a fall today. EXAM: DG HIP (WITH OR WITHOUT PELVIS) 2-3V LEFT COMPARISON:  None. FINDINGS: The patient has a comminuted intertrochanteric fracture with subtrochanteric extension of the left hip. The lesser trochanter is a separate fragment. The femoral head is located. No other acute abnormality. IMPRESSION: Comminuted left intertrochanteric fracture with subtrochanteric extension. Electronically Signed   By: Inge Rise M.D.   On: 08/11/2019 11:22   EKG: Independently reviewed. normal sinus rhythm, nonspecific ST and T waves changes. Echocardiogram: June 2021.  Preserved EF.  No wall motion abnormality but no significant valvular abnormality.  Right-sided heart failure.  I reviewed all nursing notes, pharmacy notes, vitals, pertinent old records.  Assessment/Plan 1.  Left intertrochanteric hip fracture. After mechanical fall. Orthopedic consulted scheduled for surgery on 08/11/2019. Pain control per orthopedic. Patient is cleared for surgery from medical perspective with low risk.  Her primary risk is actually her age only. PT OT and DVT prophylaxis per orthopedic as well.  2.  Mechanical fall CT head and C-spine unremarkable. No acute complaint. Monitor.  PT OT consultation postop.  3. Preoperative medical evaluation No Coronary revascularization/CVA within 5 years. No Recent stress test. Can Climb flight of stair, participates in recreational activity,does household chores. No Prior adverse event with anesthesia. No Alcohol use, drug use.  A) Cardiac risk: Based on RCRI  >History of HF  With this the patient is a low risk for adverse Cardiac outcome from surgery. Recommend no further work up Be watchful of hydration  since the pt has history of CHF. Monitor Ins and Out.  B) Pulmonary risk: The pt has history of COPD Recommend optimization of lung function with use of incentive spirometry and good pulmunary toilet. continue use of nebulizer/inhaler.  Will request Surgeon to please Order Lovenox/DVT prophylaxis of his/her choice when OK from Surgeon's standpoint post op.   Nutrition: NPO for surgery.  Diet postop per surgery. DVT Prophylaxis: SCD, pharmacological prophylaxis contraindicated due to Pending surgery.  Per orthopedic.  Advance goals of care discussion: DNR patient was explained that perioperatively her DNR will be revoked  Consults: EDP consulted with Dr. Griffin Basil  Family Communication: family was present at bedside, at the time of interview.  Opportunity was given to ask question and all questions were answered satisfactorily.  Disposition:  Status is: Inpatient  Dispo: The patient is from: Home              Anticipated d/c is to: SNF              Anticipated d/c date is: 3 days              Patient currently is not medically stable to d/c.       Author: Berle Mull, MD [facility] 08/11/2019 6:11 PM   To reach On-call, see care teams to locate the attending and reach out to them via [email or web address]. If 7PM-7AM, please contact night-coverage If you still have difficulty reaching the attending provider, please page the [facility] (Director on Call) for [facility] on amion for assistance.

## 2019-08-11 NOTE — Anesthesia Postprocedure Evaluation (Signed)
Anesthesia Post Note  Patient: Kathy Pierce  Procedure(s) Performed: INTRAMEDULLARY (IM) NAIL INTERTROCHANTRIC (Left )     Patient location during evaluation: PACU Anesthesia Type: MAC and Spinal Level of consciousness: awake and alert Pain management: pain level controlled Vital Signs Assessment: post-procedure vital signs reviewed and stable Respiratory status: spontaneous breathing, nonlabored ventilation, respiratory function stable and patient connected to nasal cannula oxygen Cardiovascular status: stable and blood pressure returned to baseline Postop Assessment: no apparent nausea or vomiting and spinal receding Anesthetic complications: no   No complications documented.  Last Vitals:  Vitals:   08/11/19 2030 08/11/19 2059  BP:  (!) 110/57  Pulse:  79  Resp:  18  Temp: (!) 36.3 C (!) 36.4 C  SpO2:  97%    Last Pain:  Vitals:   08/11/19 2059  TempSrc: Oral  PainSc:                  Jefte Carithers

## 2019-08-11 NOTE — ED Provider Notes (Signed)
Medical screening examination/treatment/procedure(s) were conducted as a shared visit with non-physician practitioner(s) and myself.  I personally evaluated the patient during the encounter.    Patient was trying to take out a trash can and walking in the driveway.  She lost her footing and fell on her left hip.  She could not get up and put any weight on the left leg.  Patient is alert and appropriate.  Clear mental status.  No respiratory distress.  External rotation and shortening deformity of left lower extremity.  Foot warm and dry with intact dorsalis pedis pulse.  X-ray reviewed.  Intertrochanteric fracture.  I agree with plan of management.   Charlesetta Shanks, MD 08/11/19 1239

## 2019-08-11 NOTE — Anesthesia Procedure Notes (Addendum)
Spinal  Patient location during procedure: OR Start time: 08/11/2019 5:58 PM End time: 08/11/2019 6:01 PM Staffing Performed: anesthesiologist  Anesthesiologist: Oleta Mouse, MD Preanesthetic Checklist Completed: patient identified, IV checked, risks and benefits discussed, surgical consent, monitors and equipment checked, pre-op evaluation and timeout performed Spinal Block Patient position: left lateral decubitus Prep: DuraPrep Patient monitoring: heart rate, cardiac monitor, continuous pulse ox and blood pressure Approach: midline Location: L3-4 Injection technique: single-shot Needle Needle type: Pencan  Needle gauge: 24 G Needle length: 9 cm Assessment Sensory level: T8

## 2019-08-11 NOTE — Consult Note (Signed)
ORTHOPAEDIC CONSULTATION  REQUESTING PHYSICIAN: Lavina Hamman, MD  Chief Complaint: Left hip pain  HPI: Kathy Pierce is a 86 y.o. female with past medical history of skin cancer, COPD on oxygen at home, essential thrombocythemia, HTN, lung cancer, osteoporosis, spinal stenosis, and vitamin b12 deficiency.  Patient had a fall this morning when she was taking out the trash. She was walking down the driveway and lost her foot. She fell landed on her left hip. She had immediate pain and was unable to bear weight after the fall. She was transported to [facility] Emergency Department by EMS. Work-up in the ED was significant for left intertrochanteric fracture. Orthopedics was consulted.  Patient sitting in hospital bed. Friend is at her bedside. Pain has improved with IV medications. Complains of left hip pain only. No pain to any other extremity. She denies any LOC after the fall. She denies any previous orthopedic injury. She normally ambulates with a cane.   Past Medical History:  Diagnosis Date  . Cancer (HCC)    Squamous cell carcinoma, skin, basal cell carcinoma  . Complication of anesthesia    wakes up slowly   . COPD (chronic obstructive pulmonary disease) ([place])   . Depression   . Essential thrombocythemia ([place])   . Hypertension   . Lung cancer ([place]) dx'd 2017  . Osteoporosis   . Sciatica of left side 06/16/2012  . Spinal stenosis of lumbar region 06/16/2012  . Vitamin B12 deficiency    Past Surgical History:  Procedure Laterality Date  . APPENDECTOMY    . CATARACT EXTRACTION, BILATERAL Bilateral   . STAPEDECTOMY Right   . TONSILLECTOMY    . VIDEO BRONCHOSCOPY WITH ENDOBRONCHIAL NAVIGATION N/A 02/11/2016   Procedure: VIDEO BRONCHOSCOPY WITH ENDOBRONCHIAL NAVIGATION;  Surgeon: Grace Isaac, MD;  Location: [place];  Service: Thoracic;  Laterality: N/A;  . VIDEO BRONCHOSCOPY WITH ENDOBRONCHIAL ULTRASOUND N/A 02/11/2016   Procedure: VIDEO BRONCHOSCOPY WITH  ENDOBRONCHIAL ULTRASOUND with biopsy of nodes #7 and #10;  Surgeon: Grace Isaac, MD;  Location: [place];  Service: Thoracic;  Laterality: N/A;   Social History   Socioeconomic History  . Marital status: Widowed    Spouse name: Not on file  . Number of children: Not on file  . Years of education: Not on file  . Highest education level: Not on file  Occupational History  . Not on file  Tobacco Use  . Smoking status: Former Smoker    Packs/day: 1.00    Years: 21.00    Pack years: 21.00    Types: Cigarettes    Quit date: 07/10/1976    Years since quitting: 43.1  . Smokeless tobacco: Never Used  Vaping Use  . Vaping Use: Never used  Substance and Sexual Activity  . Alcohol use: No  . Drug use: No  . Sexual activity: Never  Other Topics Concern  . Not on file  Social History Narrative  . Not on file   Social Determinants of Health   Financial Resource Strain:   . Difficulty of Paying Living Expenses:   Food Insecurity:   . Worried About Charity fundraiser in the Last Year:   . Arboriculturist in the Last Year:   Transportation Needs:   . Film/video editor (Medical):   Marland Kitchen Lack of Transportation (Non-Medical):   Physical Activity:   . Days of Exercise per Week:   . Minutes of Exercise per Session:   Stress:   .  Feeling of Stress :   Social Connections:   . Frequency of Communication with Friends and Family:   . Frequency of Social Gatherings with Friends and Family:   . Attends Religious Services:   . Active Member of Clubs or Organizations:   . Attends Archivist Meetings:   Marland Kitchen Marital Status:    Family History  Problem Relation Age of Onset  . Heart attack Father   . Cancer Mother   . Cancer Sister   . Cancer Brother   . Cancer Brother   . Cancer Daughter    Allergies  Allergen Reactions  . Penicillins Hives and Rash    Did it involve swelling of the face/tongue/throat, SOB, or low BP? N Did it involve sudden or severe rash/hives, skin  peeling, or any reaction on the inside of your mouth or nose? Y Did you need to seek medical attention at a hospital or doctor's office? N When did it last happen?Over 59 Years Ago If all above answers are "NO", may proceed with cephalosporin use.    . Codeine Nausea And Vomiting  . Erythromycin Nausea And Vomiting  . Sulfa Drugs Cross Reactors Nausea And Vomiting   Prior to Admission medications   Medication Sig Start Date End Date Taking? Authorizing Provider  albuterol (PROVENTIL HFA;VENTOLIN HFA) 108 (90 Base) MCG/ACT inhaler Inhale 2 puffs into the lungs every 6 (six) hours as needed for wheezing or shortness of breath.    [provider]  aspirin 81 MG tablet Take 81 mg by mouth daily.    [provider]  atenolol (TENORMIN) 25 MG tablet Take 25 mg by mouth daily.  07/26/19   [provider]  benzonatate (TESSALON) 100 MG capsule Take 100 mg by mouth 3 (three) times daily as needed for cough. Patient not taking: Reported on 07/26/2019    [provider]  calcium carbonate (OS-CAL - DOSED IN MG OF ELEMENTAL CALCIUM) 1250 MG tablet Take 1 tablet by mouth 2 (two) times daily with a meal.     [provider]  cyanocobalamin (,VITAMIN B-12,) 1000 MCG/ML injection Inject 1,000 mcg into the muscle every 30 (thirty) days.    [provider]  furosemide (LASIX) 20 MG tablet Take 1 tablet (20 mg total) by mouth every other day. 07/29/19   Nita Sells, MD  hydroxyurea (HYDREA) 500 MG capsule Take 500 mg by mouth every other day. May take with food to minimize GI side effects. Reports alternating one day one day then two tablets the next.    [provider]  Hypromellose (ARTIFICIAL TEARS OP) Place 1 drop into both eyes as needed (for dry eyes).    [provider]  levothyroxine (SYNTHROID) 25 MCG tablet Take 25 mcg by mouth daily. 08/24/18   [provider]  meclizine (ANTIVERT) 25 MG tablet Take 25 mg by  mouth 3 (three) times daily as needed for dizziness.    [provider]  Multiple Vitamins-Minerals (PRESERVISION AREDS PO) Take 1 tablet by mouth daily.    [provider]  omeprazole (PRILOSEC) 20 MG capsule Take 20 mg by mouth every morning. Patient not taking: Reported on 07/26/2019 07/11/19   [provider]  sertraline (ZOLOFT) 100 MG tablet Take 100 mg by mouth daily.  07/26/19   [provider]  traMADol (ULTRAM) 50 MG tablet Take 50 mg by mouth every 8 (eight) hours as needed for moderate pain.  07/25/15   [provider]  umeclidinium-vilanterol (ANORO ELLIPTA)  62.5-25 MCG/INH AEPB Inhale 1 puff into the lungs daily. 06/28/19   Marshell Garfinkel, MD   DG Chest 1 View  Result Date: 08/11/2019 CLINICAL DATA:  Pain following fall EXAM: CHEST  1 VIEW COMPARISON:  July 26, 2019 chest radiograph and July 27, 2019 chest CT FINDINGS: There is underlying emphysematous change. There is scarring in the left mid lung region with nearby fiducial clips, indicative of post radiation therapy fibrosis. No appreciable edema or airspace opacity. Heart is slightly enlarged with pulmonary vascularity reflective of underlying emphysematous change. No adenopathy. No bone lesions. IMPRESSION: Scarring with post radiation therapy fibrosis on the left, stable. Underlying emphysematous changes throughout the lungs, better delineated on recent CT. No frank edema or airspace opacity. Stable cardiac prominence. No adenopathy evident by radiography. Electronically Signed   By: Lowella Grip III M.D.   On: 08/11/2019 11:21   CT Head Wo Contrast  Result Date: 08/11/2019 CLINICAL DATA:  Fall EXAM: CT HEAD WITHOUT CONTRAST TECHNIQUE: Contiguous axial images were obtained from the base of the skull through the vertex without intravenous contrast. COMPARISON:  None. FINDINGS: Brain: There is no acute intracranial hemorrhage, mass effect, or edema. There is no acute appearing loss of  gray-white differentiation. Chronic left parietal infarct is present. Patchy and confluent areas of hypoattenuation in the supratentorial white matter are nonspecific but probably reflect moderate chronic microvascular ischemic changes. There is no extra-axial fluid collection. Prominence of the ventricles and sulci reflects mild generalized parenchymal volume loss. Vascular: There is atherosclerotic calcification at the skull base. Skull: Calvarium is unremarkable. Sinuses/Orbits: No acute finding. Other: None. IMPRESSION: No evidence of acute intracranial injury. Chronic/nonemergent findings detailed above. Electronically Signed   By: Macy Mis M.D.   On: 08/11/2019 13:34   CT Cervical Spine Wo Contrast  Result Date: 08/11/2019 CLINICAL DATA:  Fall EXAM: CT CERVICAL SPINE WITHOUT CONTRAST TECHNIQUE: Multidetector CT imaging of the cervical spine was performed without intravenous contrast. Multiplanar CT image reconstructions were also generated. COMPARISON:  None. FINDINGS: Alignment: Anteroposterior alignment is maintained. Skull base and vertebrae: No acute cervical spine fracture. Vertebral body heights are preserved apart from degenerative endplate irregularity. No destructive osseous lesion. Soft tissues and spinal canal: No prevertebral fluid or swelling. No visible canal hematoma. Disc levels: Multilevel degenerative changes are present including disc space narrowing, endplate osteophytes, and facet and uncovertebral hypertrophy. Mild canal stenosis. Multilevel foraminal stenosis. Upper chest: Emphysema and scarring at the lung apices. Other: Mild calcified plaque at the ICA origins. IMPRESSION: No acute cervical spine fracture. Electronically Signed   By: Macy Mis M.D.   On: 08/11/2019 13:38   DG Hip Unilat With Pelvis 2-3 Views Left  Result Date: 08/11/2019 CLINICAL DATA:  Left hip pain after a fall today. EXAM: DG HIP (WITH OR WITHOUT PELVIS) 2-3V LEFT COMPARISON:  None. FINDINGS: The  patient has a comminuted intertrochanteric fracture with subtrochanteric extension of the left hip. The lesser trochanter is a separate fragment. The femoral head is located. No other acute abnormality. IMPRESSION: Comminuted left intertrochanteric fracture with subtrochanteric extension. Electronically Signed   By: Inge Rise M.D.   On: 08/11/2019 11:22   Family History Reviewed and non-contributory, no pertinent history of problems with bleeding or anesthesia      Review of Systems 14 system ROS conducted and negative except for that noted in HPI   OBJECTIVE  Vitals: Patient Vitals for the past 8 hrs:  BP Temp Temp src Pulse Resp SpO2  08/11/19 1330 102/80 -- --  65 18 100 %  08/11/19 1315 (!) 103/57 -- -- 67 18 100 %  08/11/19 1159 -- 98 F (36.7 C) Oral -- -- --  08/11/19 1157 129/65 -- -- 65 17 100 %   General: Elderly female, alert, no acute distress Cardiovascular: Warm extremities noted Respiratory: No cyanosis, on nasal cannula GI: No organomegaly, abdomen is soft and non-tender Skin: No lesions in the area of chief complaint other than those listed below in MSK exam.  Neurologic: Sensation intact distally save for the below mentioned MSK exam Psychiatric: Patient is competent for consent with normal mood and affect Lymphatic: No swelling obvious and reported other than the area involved in the exam below  Extremities  RUE and LUE: actively moves upper extremities without pain. Non-tender to palpation throughout upper extremity. Neurovascularly intact. Superficial abrasion left elbow.  RLE: actively moves RLE without pain. Non-tender to palpation throughout RLE. Neurovascularly intact. LLE: Shortened and externally rotated.  ROM deferred. + GS/TA/EHL. Sensation intact in DP/SP/S/S/P distributions. 2+ DP pulse with warm and well perfused digits. Compartments soft and compressible, with no pain on passive stretch. Skin cancer removal site covered with bandage and kerlix  on left ankle.    Test Results Imaging Left hip demonstrates left intertrochanteric femur fracture  Labs cbc Recent Labs    08/11/19 1157  WBC 12.5*  HGB 12.4  HCT 38.3  PLT 649*    Labs inflam No results for input(s): CRP in the last 72 hours.  Invalid input(s): ESR  Labs coag No results for input(s): INR, PTT in the last 72 hours.  Invalid input(s): PT  Recent Labs    08/11/19 1157  NA 136  K 4.0  CL 97*  CO2 27  GLUCOSE 137*  BUN 23  CREATININE 0.79  CALCIUM 9.0     ASSESSMENT AND PLAN: 86 y.o. female with the following: left intertrochanteric femur fracture  Orthopedics recommends admission to a medical service and we will provide consultation and follow along  Discussed the nature of the injury as well as the care with the patient as well as the family.  Discussed options and non-operative versus operative measures. Nonoperative measures are not well tolerated as patient's on bedrest for extended periods of time tend to develop secondary issues such as pneumonia, urinary tract infections, bedsores and delirium.  Based on this our recommendation is for operative measures.  Understanding this the patient/family elected to proceed with operative measures.  The risks and benefits of  surgical intervention including infection, bleeding, nerve injury, periprosthetic fracture, the need for revision surgery, leg length discrepancy, gait change, blood clots, cardiopulmonary complications, morbidity, mortality, among others, and they were willing to proceed.     - Plan: Operative fixation this evening - NPO -Medicine team to admit and perform pre-op clearance - Weight Bearing Status/Activity: will ammend WB status postop, bedrest for now - PT/OT post op - VTE Prophylaxis: SCDs for now - Pain control: PRN pain medications - Dispo: Likely to require Rehab or SNF placement upon discharge.  - Contact information: Dr. Ophelia Charter, Noemi Chapel, PA-C  [place], [place] 08/11/2019

## 2019-08-11 NOTE — ED Notes (Signed)
Per [facility], will pick up pt at 1600 to go to [facility] holding OR for surgery. Pt aware.

## 2019-08-11 NOTE — Op Note (Signed)
Orthopaedic Surgery Operative Note (CSN: [id number])  Kathy Pierce  Jun 10, 1925 Date of Surgery: 08/11/2019   Diagnoses:  Left four-part intertrochanteric and subtrochanteric femur fracture  Procedure: Left cephalomedullary nailing   Operative Finding Successful completion of the planned procedure.  Patient had a complex fracture with poor bone quality.  She had essentially a free-floating lesser and greater trochanter and was difficult to obtain a good reduction however we are very happy with her overall neck shaft angle and reduction though the trochanters were free pieces.  Implants: Biomet affixes 360 mm nail, 105 cephalomedullary screw, 90 derotational screw.  Post-operative plan: The patient will be weightbearing to tolerance.  The patient will be admitted to the hospitalist.  DVT prophylaxis Lovenox 40 mg/day until mobilizing and then consider transition in clinic to alternative medicines.   Pain control with PRN pain medication preferring oral medicines.  Follow up plan will be scheduled in approximately 7 days for incision check and XR.  Post-Op Diagnosis: Same Surgeons:Primary: Hiram Gash, MD Assistants:Caroline McBane PA-C Location: [facility] ROOM 10 Anesthesia: Spinal Antibiotics: Ancef 2 g with local vancomycin powder 1 g at the surgical site Tourniquet time: * No tourniquets in log * Estimated Blood Loss: 323 Complications: None Specimens: None Implants: Implant Name Type Inv. Item Serial No. Manufacturer Lot No. LRB No. Used Action  HIP FRAC NAIL LEFT 11X360MM - [id number] Orthopedic Implant HIP FRAC NAIL LEFT 11X360MM  [employer](ORTH,TRAU,BIO,SG) [id number] Left 1 Implanted  HFN LAG SCREW 10.5MM X 105MM - [id number] Screw HFN LAG SCREW 10.5MM X 105MM  [employer]IM[employer](ORTH,TRAU,BIO,SG) [id number] B Left 1 Implanted  HFN A/R SCREW 90MM - [id number] Screw HFN A/R SCREW 90MM  ZIM[employer]ON(ORTH,TRAU,BIO,SG) [id number] B Left 1 Implanted  SCREW BONE CORTICAL 5.0X40 - [id number] Screw  SCREW BONE CORTICAL 5.0X40  ZIMMER [employer]ON(ORTH,TRAU,BIO,SG) [id number] Left 1 Implanted    Indications for Surgery:   Kathy Pierce is a 86 y.o. female with fall resulting in a complex left four-part intertrochanteric femur fracture.  The risks benefits and alternatives were discussed with the patient including but not limited to the risks of nonoperative treatment, versus surgical intervention including infection, bleeding, nerve injury, periprosthetic fracture, the need for revision surgery, leg length discrepancy, gait change, blood clots, cardiopulmonary complications, morbidity, mortality, among others, and they were willing to proceed.      Procedure:   The patient was identified properly. Informed consent was obtained and the surgical site was marked. The patient was taken up to suite where general anesthesia was induced.  The patient was positioned supine on a Hana table.  The left hip was prepped and draped in the usual sterile fashion.  Timeout was performed before the beginning of the case.  The patient was placed supine on a Hana table and appropriate reduction was obtained and visualized on fluoroscopy prior to the beginning of the procedure.  We made an incision proximal to the greater trochanter and dissected down through the fascia.  We then carefully placed our starting awl localizing under fluoroscopy prior to advancing the awl into the bone and sliding a ball-tipped guidewire through the awl into the femoral canal.  The wire was passed to an appropriate level at the fascial scar of the distal femur and measurement was obtained proximally using fluoroscopy.  We selected a length of nail noted above.  There is significant anterior translation of the proximal fragment and posterior translation of the distal fragment.  There is a 4 part fractures of the starting point was difficult to obtain.  We are able to manually reduce the shaft to the proximal fragment before putting down her  wire.  Entry reamer was used needed but the nail was unreamed.  At this point we placed our nail localizing under fluoroscopy that it was at the appropriate level prior to using the outrigger device to pass a wire and then the cephalo-medullary screw.  We used a blunt bone hook to hold the neck reduction while placing the wire and screw.  This was a complex comminuted fracture and our neck shaft angle was appropriate though the trochanters were off.  We placed a derotational wire and screw to avoid spinning the neck fragment.  The screw was locked proximally to avoid over collapse.  We took final shots at the proximal femur and then used perfect circle technique to place one distal interlock screw.  Final pictures were obtained.  The wounds were thoroughly irrigated closed in a multilayer fashion with absorable sutures.  A sterile dressing was placed.  The patient was awoken from general anesthesia and taken to the PACU in stable condition without complication.    Noemi Chapel, PA-C, present and scrubbed throughout the case, critical for completion in a timely fashion, and for retraction, instrumentation, closure.

## 2019-08-11 NOTE — Transfer of Care (Signed)
Immediate Anesthesia Transfer of Care Note  Patient: Kathy Pierce  Procedure(s) Performed: Procedure(s): INTRAMEDULLARY (IM) NAIL INTERTROCHANTRIC (Left)  Patient Location: PACU  Anesthesia Type:Spinal  Level of Consciousness:  sedated, patient cooperative and responds to stimulation  Airway & Oxygen Therapy:Patient Spontanous Breathing and Patient connected to face mask oxgen  Post-op Assessment:  Report given to PACU RN and Post -op Vital signs reviewed and stable  Post vital signs:  Reviewed and stable  Last Vitals:  Vitals:   08/11/19 1646 08/11/19 1726  BP: 121/62 108/60  Pulse: 64 66  Resp: 18 18  Temp: 36.6 C   SpO2: 861% 683%    Complications: No apparent anesthesia complications

## 2019-08-11 NOTE — Anesthesia Preprocedure Evaluation (Addendum)
Anesthesia Evaluation  Patient identified by MRN, date of birth, ID band Patient awake    Reviewed: Allergy & Precautions, NPO status , Patient's Chart, lab work & pertinent test results  History of Anesthesia Complications Negative for: history of anesthetic complications  Airway Mallampati: II  TM Distance: >3 FB Neck ROM: Full    Dental  (+) Dental Advisory Given, Edentulous Upper, Edentulous Lower   Pulmonary shortness of breath, COPD,  COPD inhaler and oxygen dependent, Patient abstained from smoking., former smoker,  Lung cancer ([place]) dx'd 2017    breath sounds clear to auscultation       Cardiovascular hypertension, +CHF   Rhythm:Regular  1. Left ventricular ejection fraction, by estimation, is 60 to 65%. The  left ventricle has normal function. The left ventricle has no regional  wall motion abnormalities. There is mild concentric left ventricular  hypertrophy. Left ventricular diastolic  parameters are consistent with Grade II diastolic dysfunction  (pseudonormalization). Elevated left ventricular end-diastolic pressure.  2. Right ventricular systolic function is normal. The right ventricular  size is normal. There is moderately elevated pulmonary artery systolic  pressure.  3. Left atrial size was severely dilated.  4. Right atrial size was mildly dilated.  5. The mitral valve is normal in structure. Trivial mitral valve  regurgitation. No evidence of mitral stenosis.  6. The aortic valve is tricuspid. Aortic valve regurgitation is trivial.  Mild aortic valve sclerosis is present, with no evidence of aortic valve  stenosis.  7. Aortic dilatation noted. There is mild dilatation of the ascending  aorta measuring 38 mm.  8. The inferior vena cava is dilated in size with >50% respiratory  variability, suggesting right atrial pressure of 8 mmHg.    Neuro/Psych PSYCHIATRIC DISORDERS Depression  Neuromuscular  disease    GI/Hepatic negative GI ROS, Neg liver ROS,   Endo/Other  negative endocrine ROS  Renal/GU negative Renal ROS     Musculoskeletal  FALL, HIP FRACTURE   Abdominal   Peds  Hematology negative hematology ROS (+)   Anesthesia Other Findings   Reproductive/Obstetrics                            Anesthesia Physical Anesthesia Plan  ASA: III  Anesthesia Plan: MAC and Spinal   Post-op Pain Management:    Induction: Intravenous  PONV Risk Score and Plan: 2 and Treatment may vary due to age or medical condition and Propofol infusion  Airway Management Planned: Nasal Cannula  Additional Equipment: None  Intra-op Plan:   Post-operative Plan:   Informed Consent: I have reviewed the patients History and Physical, chart, labs and discussed the procedure including the risks, benefits and alternatives for the proposed anesthesia with the patient or authorized representative who has indicated his/her understanding and acceptance.     Dental advisory given  Plan Discussed with: CRNA and Surgeon  Anesthesia Plan Comments:         Anesthesia Quick Evaluation

## 2019-08-12 ENCOUNTER — Encounter (HOSPITAL_COMMUNITY): Payer: Self-pay | Admitting: Orthopaedic Surgery

## 2019-08-12 LAB — BASIC METABOLIC PANEL
Anion gap: 9 (ref 5–15)
BUN: 24 mg/dL — ABNORMAL HIGH (ref 8–23)
CO2: 25 mmol/L (ref 22–32)
Calcium: 8.5 mg/dL — ABNORMAL LOW (ref 8.9–10.3)
Chloride: 101 mmol/L (ref 98–111)
Creatinine, Ser: 0.77 mg/dL (ref 0.44–1.00)
GFR calc Af Amer: >60 mL/min (ref >60)
GFR calc non Af Amer: >60 mL/min (ref >60)
Glucose, Bld: 124 mg/dL — ABNORMAL HIGH (ref 70–99)
Potassium: 4.3 mmol/L (ref 3.5–5.1)
Sodium: 135 mmol/L (ref 135–145)

## 2019-08-12 LAB — CBC
HCT: 31.1 % — ABNORMAL LOW (ref 36.0–46.0)
Hemoglobin: 9.9 g/dL — ABNORMAL LOW (ref 12.0–15.0)
MCH: 36.7 pg — ABNORMAL HIGH (ref 26.0–34.0)
MCHC: 31.8 g/dL (ref 30.0–36.0)
MCV: 115.2 fL — ABNORMAL HIGH (ref 80.0–100.0)
Platelets: 512 10*3/uL — ABNORMAL HIGH (ref 150–400)
RBC: 2.7 MIL/uL — ABNORMAL LOW (ref 3.87–5.11)
RDW: 14.8 % (ref 11.5–15.5)
WBC: 8.9 10*3/uL (ref 4.0–10.5)
nRBC: 0 % (ref 0.0–0.2)

## 2019-08-12 LAB — CREATININE, SERUM
Creatinine, Ser: 0.67 mg/dL (ref 0.44–1.00)
GFR calc Af Amer: >60 mL/min (ref >60)
GFR calc non Af Amer: >60 mL/min (ref >60)

## 2019-08-12 LAB — MAGNESIUM: Magnesium: 1.9 mg/dL (ref 1.7–2.4)

## 2019-08-12 MED ORDER — OXYCODONE HCL 5 MG PO TABS: ORAL_TABLET | ORAL | 0 refills | Status: DC

## 2019-08-12 MED ORDER — ONDANSETRON HCL 4 MG PO TABS: 4.0000 mg | ORAL_TABLET | Freq: Three times a day (TID) | ORAL | 1 refills | Status: AC | PRN

## 2019-08-12 MED ORDER — ENOXAPARIN SODIUM 40 MG/0.4ML ~~LOC~~ SOLN
40.0000 mg | SUBCUTANEOUS | 0 refills | Status: DC
Start: 2019-08-12 — End: 2020-01-10

## 2019-08-12 MED ORDER — ACETAMINOPHEN 500 MG PO TABS
1000.0000 mg | ORAL_TABLET | Freq: Three times a day (TID) | ORAL | 0 refills | Status: AC
Start: 2019-08-12 — End: 2019-08-26

## 2019-08-12 NOTE — Evaluation (Signed)
Occupational Therapy Evaluation Patient Details Name: Kathy Pierce MRN: [id number] DOB: 1925/10/14 Today's Date: 08/12/2019    History of Present Illness 86 yo female admitted after fall resulting in L hip fracture. S/p nailing L LE. Marland Kitchen Hx of COPD, lung ca, depression, osteoporosis, squamous cell cancer, spinal stenosis, non healing L leg wound   Clinical Impression   Patient with functional deficits listed below impacting safety and independence with self care. Patient mod A with mod cues to sequence bed mobility, min A with sit to stand and mod cues for safety with hand placement + sequencing pivot to recliner chair with poor eccentric control. Patient reports she could have 24/7 support from family/friends if needed, pending patient progress may be able to D/C [facility] with 24/7 assist however at this time recommend SNF rehab for ADL and transfer training.    Follow Up Recommendations  SNF;Supervision/Assistance - 24 hour (pt would like HH, poss pending progress and 24/7 SUP)    Equipment Recommendations  None recommended by OT       Precautions / Restrictions Precautions Precautions: Fall Restrictions Weight Bearing Restrictions: Yes LLE Weight Bearing: Weight bearing as tolerated      Mobility Bed Mobility Overal bed mobility: Needs Assistance Bed Mobility: Supine to Sit     Supine to sit: Mod assist;HOB elevated     General bed mobility comments: cues for sequencing, assist to mobilize L LE and elevate trunk to sitting  Transfers Overall transfer level: Needs assistance Equipment used: Rolling walker (2 wheeled) Transfers: Sit to/from [facility] Sit to Stand: Min assist Stand pivot transfers: Min assist       General transfer comment: assist to power up to standing and stabilize, cues to sequence safety with hand placement and turning walker to sit into recliner. decreased eccentric control with sitting does not initiate reaching back for chair  arms.    Balance Overall balance assessment: Needs assistance Sitting-balance support: Bilateral upper extremity supported;Feet supported Sitting balance-Leahy Scale: Good     Standing balance support: Bilateral upper extremity supported;During functional activity Standing balance-Leahy Scale: Poor Standing balance comment: reliant on walker                           ADL either performed or assessed with clinical judgement   ADL Overall ADL's : Needs assistance/impaired Eating/Feeding: Independent;Sitting (in recliner)   Grooming: Set up;Sitting   Upper Body Bathing: Set up;Sitting   Lower Body Bathing: Maximal assistance;Sit to/from stand   Upper Body Dressing : Set up;Sitting   Lower Body Dressing: Maximal assistance;Sit to/from stand Lower Body Dressing Details (indicate cue type and reason): increased assist due to pain and limited mobility Toilet Transfer: Minimal assistance;Cueing for safety;Cueing for sequencing;Stand-pivot;BSC;RW Toilet Transfer Details (indicate cue type and reason): to recliner, decreased balance and safety with walker, assist to power up to standing Toileting- Clothing Manipulation and Hygiene: Maximal assistance;Sit to/from stand       Functional mobility during ADLs: Minimal assistance;Rolling walker;Cueing for safety;Cueing for sequencing General ADL Comments: patient requiring increased assistance with self care due to pain, decreased balance, safety awareness                   Pertinent Vitals/Pain Pain Assessment: 0-10 Pain Score: 6  Pain Location: L LE Pain Descriptors / Indicators: Grimacing;Guarding;Discomfort Pain Intervention(s): Premedicated before session     Hand Dominance Right   Extremity/Trunk Assessment Upper Extremity Assessment Upper Extremity Assessment: Generalized weakness  Lower Extremity Assessment Lower Extremity Assessment: Defer to PT evaluation   Cervical / Trunk Assessment Cervical /  Trunk Assessment: Kyphotic   Communication Communication Communication: No difficulties   Cognition Arousal/Alertness: Awake/alert Behavior During Therapy: WFL for tasks assessed/performed Overall Cognitive Status: Within Functional Limits for tasks assessed                                                Home Living Family/patient expects to be discharged to:: Private residence Living Arrangements: Alone Available Help at Discharge: Family;Friend(s);Other (Comment) (pt says can get 24/7 if needed) Type of Home: House Home Access: Level entry     Home Layout: One level     Bathroom Shower/Tub: Teacher, early years/pre: Handicapped height     Home Equipment: [place] - single point;Grab bars - tub/shower;Shower seat;Walker - 2 wheels          Prior Functioning/Environment Level of Independence: Independent        Comments: still drives        OT Problem List: Decreased strength;Decreased activity tolerance;Impaired balance (sitting and/or standing);Decreased safety awareness;Decreased knowledge of use of DME or AE;Pain      OT Treatment/Interventions: Self-care/ADL training;Therapeutic exercise;DME and/or AE instruction;Therapeutic activities;Patient/family education;Balance training    OT Goals(Current goals can be found in the care plan section) Acute Rehab OT Goals Patient Stated Goal: "if I go to rehab I would like [facility]" OT Goal Formulation: With patient Time For Goal Achievement: 08/26/19 Potential to Achieve Goals: Good  OT Frequency: Min 2X/week    AM-PAC OT "6 Clicks" Daily Activity     Outcome Measure Help from another person eating meals?: None Help from another person taking care of personal grooming?: A Little Help from another person toileting, which includes using toliet, bedpan, or urinal?: A Lot Help from another person bathing (including washing, rinsing, drying)?: A Lot Help from another person to put on and  taking off regular upper body clothing?: A Little Help from another person to put on and taking off regular lower body clothing?: A Lot 6 Click Score: 16   End of Session Equipment Utilized During Treatment: Rolling walker Nurse Communication: Mobility status  Activity Tolerance: Patient tolerated treatment well Patient left: in chair;with call bell/phone within reach;with chair alarm set  OT Visit Diagnosis: Unsteadiness on feet (R26.81);Other abnormalities of gait and mobility (R26.89);History of falling (Z91.81);Pain Pain - Right/Left: Left Pain - part of body: Hip;Leg                Time: 5638-9373 OT Time Calculation (min): 20 min Charges:  OT General Charges $OT Visit: 1 Visit OT Evaluation $OT Eval Moderate Complexity: [place] OT Pager: [place] 08/12/2019, 12:15 PM

## 2019-08-12 NOTE — Progress Notes (Signed)
   ORTHOPAEDIC PROGRESS NOTE  s/p Procedure(s): INTRAMEDULLARY (IM) NAIL LEFT INTERTROCHANTRIC FEMUR FRACTURE on 08/11/2019 with Dr. Griffin Basil  SUBJECTIVE: Patient reports only of pain in her left hip. Motivated to work with therapy. Daughter at bedside. Daughter is leaving for [employer] today. No chest pain. No SOB. No nausea/vomiting. No other complaints.  OBJECTIVE: PE: Left lower extremity: proximal aquacel with more drainage noted - new dressing placed today, middle and distal dressings with minimal strikethrough, leg lengths equal, EHL/TA/GSC, endorses distal sensation warm well perfused foot   Vitals:   08/12/19 0200 08/12/19 0622  BP: 108/80 (!) 103/48  Pulse: 80 74  Resp: 17 17  Temp: 98 F (36.7 C) 97.7 F (36.5 C)  SpO2: 100% 100%    ASSESSMENT: Kathy Pierce is a 86 y.o. female doing well postoperatively. POD#1  PLAN: Weightbearing: WBAT LLE Insicional and dressing care: Reinforce dressings as needed Orthopedic device(s): None Showering: Post-op day #2 with assistance VTE prophylaxis: Lovenox 40mg  qd  Pain control: PRN pain medications, preferring oral medications Follow - up plan: 1 week in office with Dr. Damien Fusi information:  [id number] 8-5 Noemi Chapel PA-C, After hours and holidays please check [email or web address] for group call information for [facility] Dispo: PT eval pending. Likely SNF.   Prescriptions for Lovenox, Oxycodone, Zofran printed and placed in patient's chart.  Noemi Chapel, PA-C 08/12/2019

## 2019-08-12 NOTE — NC FL2 (Signed)
[place] LEVEL OF CARE SCREENING TOOL     IDENTIFICATION  Patient Name: Kathy Pierce Birthdate: June 30, 1925 Sex: female Admission Date (Current Location): 08/11/2019  [facility] and [place] Number:  Herbalist and Address:  [facility],  [place] [address] [place]      Provider Number: [id number]  Attending Physician Name and Address:  Lavina Hamman, MD  Relative Name and Phone Number:       Current Level of Care: SNF Recommended Level of Care: [place] Prior Approval Number:    Date Approved/Denied:   PASRR Number: [id number]  Discharge Plan: SNF    Current Diagnoses: Patient Active Problem List   Diagnosis Date Noted  . Hip fracture ([place]) 08/11/2019  . Congestive heart failure (CHF) ([place]) 07/26/2019  . Chronic obstructive pulmonary disease ([place]) 12/18/2017  . Essential hypertension 12/18/2017  . Major depression 12/18/2017  . Osteoporosis 12/18/2017  . Vitamin B deficiency 12/18/2017  . Essential thrombocythemia ([place]) 11/18/2016  . 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 Conductive hearing loss in right ear 04/25/2015  . Cerumen impaction 04/25/2015  . Sciatica of left side 06/16/2012  . Spinal stenosis of lumbar region 06/16/2012    Orientation RESPIRATION BLADDER Height & Weight     Self, Time, Situation, Place  Normal Continent, External catheter Weight: 108 lb 0.4 oz (49 kg) Height:  5\' 5"  (165.1 cm)  BEHAVIORAL SYMPTOMS/MOOD NEUROLOGICAL BOWEL NUTRITION STATUS      Continent    AMBULATORY STATUS COMMUNICATION OF NEEDS Skin   Limited Assist   Surgical wounds                       Personal Care Assistance Level of Assistance  Bathing, Dressing Bathing Assistance: Limited assistance   Dressing Assistance: Limited assistance     Functional Limitations Info             SPECIAL CARE FACTORS FREQUENCY  PT (By licensed PT), OT (By licensed OT)     PT  Frequency: 5x/wk OT Frequency: 5x/wk            Contractures Contractures Info: Not present    Additional Factors Info  Code Status, Allergies Code Status Info: DNR Allergies Info: see MAR           Current Medications (08/12/2019):  This is the current hospital active medication list Current Facility-Administered Medications  Medication Dose Route Frequency Provider Last Rate Last Admin  . acetaminophen (TYLENOL) tablet 1,000 mg  1,000 mg Oral Q8H McBane, Caroline N, PA-C   1,000 mg at 08/12/19 1444  . albuterol (PROVENTIL) (2.5 MG/3ML) 0.083% nebulizer solution 3 mL  3 mL Inhalation Q6H PRN McBane, Maylene Roes, PA-C      . atenolol (TENORMIN) tablet 25 mg  25 mg Oral Daily McBane, Maylene Roes, PA-C   25 mg at 08/12/19 1118  . bisacodyl (DULCOLAX) EC tablet 5 mg  5 mg Oral Daily PRN McBane, Maylene Roes, PA-C      . docusate sodium (COLACE) capsule 100 mg  100 mg Oral BID Ethelda Chick, PA-C   100 mg at 08/12/19 1117  . enoxaparin (LOVENOX) injection 40 mg  40 mg Subcutaneous Q24H McBane, Caroline N, PA-C   40 mg at 08/12/19 1121  . HYDROmorphone (DILAUDID) injection 0.25 mg  0.25 mg Intravenous Q4H PRN Ethelda Chick, PA-C   0.25 mg at 08/11/19 2129  . hydroxyurea (  HYDREA) capsule 500 mg  500 mg Oral Q48H Green, Terri L, RPH   500 mg at 08/12/19 1119   And  . [START ON 08/13/2019] hydroxyurea (HYDREA) capsule 1,000 mg  1,000 mg Oral Q48H Green, Terri L, RPH      . levothyroxine (SYNTHROID) tablet 25 mcg  25 mcg Oral Q0600 Ethelda Chick, PA-C   25 mcg at 08/12/19 0644  . magnesium citrate solution 1 Bottle  1 Bottle Oral Once PRN McBane, Maylene Roes, PA-C      . menthol-cetylpyridinium (CEPACOL) lozenge 3 mg  1 lozenge Oral PRN McBane, Maylene Roes, PA-C       Or  . phenol (CHLORASEPTIC) mouth spray 1 spray  1 spray Mouth/Throat PRN McBane, Maylene Roes, PA-C      . metoCLOPramide (REGLAN) tablet 5-10 mg  5-10 mg Oral Q8H PRN McBane, Maylene Roes, PA-C       Or  .  metoCLOPramide (REGLAN) injection 5-10 mg  5-10 mg Intravenous Q8H PRN McBane, Caroline N, PA-C      . ondansetron (ZOFRAN) tablet 4 mg  4 mg Oral Q6H PRN McBane, Maylene Roes, PA-C       Or  . ondansetron (ZOFRAN) injection 4 mg  4 mg Intravenous Q6H PRN McBane, Maylene Roes, PA-C      . oxyCODONE (Oxy IR/ROXICODONE) immediate release tablet 10 mg  10 mg Oral Q4H PRN McBane, Caroline N, PA-C      . oxyCODONE (Oxy IR/ROXICODONE) immediate release tablet 5 mg  5 mg Oral Q4H PRN Ethelda Chick, PA-C   5 mg at 08/12/19 0815  . polyethylene glycol (MIRALAX / GLYCOLAX) packet 17 g  17 g Oral Daily Ethelda Chick, PA-C   17 g at 08/12/19 1117  . polyvinyl alcohol (LIQUIFILM TEARS) 1.4 % ophthalmic solution 2 drop  2 drop Both Eyes PRN McBane, Maylene Roes, PA-C      . senna-docusate (Senokot-S) tablet 1 tablet  1 tablet Oral QHS PRN McBane, Maylene Roes, PA-C      . sertraline (ZOLOFT) tablet 50 mg  50 mg Oral Daily McBane, Caroline N, PA-C   50 mg at 08/12/19 1118  . umeclidinium-vilanterol (ANORO ELLIPTA) 62.5-25 MCG/INH 1 puff  1 puff Inhalation Daily McBane, Maylene Roes, PA-C   1 puff at 08/12/19 0900     Discharge Medications: Please see discharge summary for a list of discharge medications.  Relevant Imaging Results:  Relevant Lab Results:   Additional Information SS# [id number]  Lennart Pall, LCSW

## 2019-08-12 NOTE — Plan of Care (Signed)
  Problem: Education: Goal: Knowledge of General Education information will improve Description: Including pain rating scale, medication(s)/side effects and non-pharmacologic comfort measures Outcome: Progressing   Problem: Health Behavior/Discharge Planning: Goal: Ability to manage health-related needs will improve Outcome: Progressing   Problem: Clinical Measurements: Goal: Ability to maintain clinical measurements within normal limits will improve Outcome: Progressing Goal: Will remain free from infection Outcome: Progressing Goal: Diagnostic test results will improve Outcome: Progressing Goal: Respiratory complications will improve Outcome: Progressing Goal: Cardiovascular complication will be avoided Outcome: Progressing   Problem: Activity: Goal: Risk for activity intolerance will decrease Outcome: Progressing   Problem: Nutrition: Goal: Adequate nutrition will be maintained Outcome: Progressing   Problem: Coping: Goal: Level of anxiety will decrease Outcome: Progressing   Problem: Elimination: Goal: Will not experience complications related to bowel motility Outcome: Progressing Goal: Will not experience complications related to urinary retention Outcome: Progressing   Problem: Pain Managment: Goal: General experience of comfort will improve Outcome: Progressing   Problem: Safety: Goal: Ability to remain free from injury will improve Outcome: Progressing   Problem: Skin Integrity: Goal: Risk for impaired skin integrity will decrease Outcome: Progressing   Problem: Education: Goal: Verbalization of understanding the information provided (i.e., activity precautions, restrictions, etc) will improve Outcome: Progressing Goal: Individualized Educational Video(s) Outcome: Progressing   Problem: Activity: Goal: Ability to ambulate and perform ADLs will improve Outcome: Progressing   Problem: Self-Concept: Goal: Ability to maintain and perform role  responsibilities to the fullest extent possible will improve Outcome: Progressing   Problem: Clinical Measurements: Goal: Postoperative complications will be avoided or minimized Outcome: Progressing   Problem: Pain Management: Goal: Pain level will decrease Outcome: Progressing

## 2019-08-12 NOTE — Progress Notes (Signed)
[facility] Progress Note  Patient: Kathy Pierce    CVE:[id number]  DOA: 08/11/2019     Date of Service: the patient was seen and examined on 08/12/2019  Brief hospital course: Kathy Pierce is a 86 y.o. female with Past medical history of recent admission for HFpEF, COPD, HTN, prior history of lung cancer, left leg squamous cell carcinoma, depression, osteoporosis, B12 deficiency, underweight, hypothyroidism. Presented with a mechanical fall.  Found to have intertrochanteric fracture of the left hip. Underwent intramedullary nail implant on 08/11/2019. Currently plan is monitor postoperative recovery.  Assessment and Plan: 1.  Left intertrochanteric hip fracture. After mechanical fall. Orthopedic consulted scheduled for surgery on 08/11/2019. Pain control per orthopedic. Patient is cleared for surgery from medical perspective with low risk. Her primary risk is actually her age only. PT OT and DVT prophylaxis per orthopedic as well.  2.  Mechanical fall CT head and C-spine unremarkable. No acute complaint. Monitor.  PT OT consultation postop.  3. Preoperative medical evaluation Low risk. Tolerated surgery very well.  4.  Postoperative acute blood loss anemia. Hemoglobin dropped from 12-9.9. Monitor. Transfuse for hemoglobin less than 8.  5.  Chronic HFpEF. Chronic respiratory failure. History of COPD Essential hypertension Continue inhalers. Holding Lasix for now Likely will resume tomorrow. Continue home oxygen.  6.  Left lower lobe cancer SP radiation 2018. On observation. On hydroxyurea. Monitor while patient being monitored for anemia.  7.  Depression. Currently stable. Continue Zoloft.   Diet: Regular diet DVT Prophylaxis:   enoxaparin (LOVENOX) injection 40 mg Start: 08/12/19 0800 SCDs Start: 08/11/19 2117 SCDs Start: 08/11/19 1810 Place TED hose Start: 08/11/19 1810    Advance goals of care discussion: DNR  Family Communication: no family  was present at bedside, at the time of interview.   Disposition:  Status is: Inpatient  Remains inpatient appropriate because:Ongoing diagnostic testing needed not appropriate for outpatient work up   Dispo:  Patient From: Home  Planned Disposition: [place]  Expected discharge date: 08/14/19  Medically stable for discharge: No         Subjective: Pain well controlled.  No nausea no vomiting.  No fever or chills.  No chest pain abdomen.  Physical Exam:  General: Appear in mild distress, no Rash; Oral Mucosa Clear, moist. no Abnormal Neck Mass Or lumps, Conjunctiva normal  Cardiovascular: S1 and S2 Present, no Murmur, Respiratory: good respiratory effort, Bilateral Air entry present and Clear to Auscultation, no Crackles, no wheezes Abdomen: Bowel Sound present, Soft and no tenderness Extremities: no Pedal edema, no calf tenderness Neurology: alert and oriented to time, place, and person affect appropriate. no new focal deficit Gait not checked due to patient safety concerns  Vitals:   08/12/19 0938 08/12/19 1100 08/12/19 1456 08/12/19 1500  BP: (!) 104/57  (!) 109/48   Pulse: 82  68   Resp: 17  18   Temp: 97.6 F (36.4 C)  97.6 F (36.4 C)   TempSrc: Oral  Oral   SpO2: (!) 83% 96% (!) 43% 97%  Weight:      Height:        Intake/Output Summary (Last 24 hours) at 08/12/2019 1945 Last data filed at 08/12/2019 1743 Gross per 24 hour  Intake 1590 ml  Output 430 ml  Net 1160 ml   Filed Weights   08/11/19 2100  Weight: 49 kg    Data Reviewed: I have personally reviewed and interpreted daily labs, tele strips, imagings as discussed above. I  reviewed all nursing notes, pharmacy notes, vitals, pertinent old records I have discussed plan of care as described above with RN and patient/family.  CBC: Recent Labs  Lab 08/11/19 1157 08/12/19 0326  WBC 12.5* 8.9  NEUTROABS 10.6*  --   HGB 12.4 9.9*  HCT 38.3 31.1*  MCV 113.6* 115.2*  PLT 649*  941*   Basic Metabolic Panel: Recent Labs  Lab 08/11/19 1157 08/12/19 0325 08/12/19 0326  NA 136 135  --   K 4.0 4.3  --   CL 97* 101  --   CO2 27 25  --   GLUCOSE 137* 124*  --   BUN 23 24*  --   CREATININE 0.79 0.77 0.67  CALCIUM 9.0 8.5*  --   MG  --  1.9  --     Studies: DG C-Arm 1-60 Min-No Report  Result Date: 08/11/2019 Fluoroscopy was utilized by the requesting physician.  No radiographic interpretation.   DG HIP OPERATIVE UNILAT W OR W/O PELVIS LEFT  Result Date: 08/11/2019 CLINICAL DATA:  Hip fracture EXAM: OPERATIVE left HIP (WITH PELVIS IF PERFORMED) 5 VIEWS TECHNIQUE: Fluoroscopic spot image(s) were submitted for interpretation post-operatively. COMPARISON:  Pelvis 08/11/2019 FINDINGS: Left intertrochanteric fracture has been fixed with 2 screws and a locking intramedullary rod extending to the distal femur. Satisfactory fracture alignment. Satisfactory hardware positioning. IMPRESSION: ORIF left intertrochanteric fracture. Electronically Signed   By: Franchot Gallo M.D.   On: 08/11/2019 20:57   DG FEMUR PORT MIN 2 VIEWS LEFT  Result Date: 08/11/2019 CLINICAL DATA:  Postop intertrochanteric femur fracture repair. EXAM: LEFT FEMUR PORTABLE 2 VIEWS COMPARISON:  Preoperative hip radiograph earlier today. FINDINGS: Intramedullary nail with distal locking and trans trochanteric screw fixation of intertrochanteric femur fracture. Persistent displacement of the lesser trochanter but improved from preoperative imaging. Recent postsurgical change includes air and edema in the soft tissues. IMPRESSION: Post ORIF intertrochanteric left femur fracture without immediate postoperative complication. Electronically Signed   By: Keith Rake M.D.   On: 08/11/2019 20:43    Scheduled Meds: . acetaminophen  1,000 mg Oral Q8H  . atenolol  25 mg Oral Daily  . docusate sodium  100 mg Oral BID  . enoxaparin (LOVENOX) injection  40 mg Subcutaneous Q24H  . hydroxyurea  500 mg Oral Q48H    And  . [START ON 08/13/2019] hydroxyurea  1,000 mg Oral Q48H  . levothyroxine  25 mcg Oral Q0600  . polyethylene glycol  17 g Oral Daily  . sertraline  50 mg Oral Daily  . umeclidinium-vilanterol  1 puff Inhalation Daily   Continuous Infusions: PRN Meds: albuterol, bisacodyl, HYDROmorphone (DILAUDID) injection, magnesium citrate, menthol-cetylpyridinium **OR** phenol, metoCLOPramide **OR** metoCLOPramide (REGLAN) injection, ondansetron **OR** ondansetron (ZOFRAN) IV, oxyCODONE, oxyCODONE, polyvinyl alcohol, senna-docusate  Time spent: 35 minutes  Author: Berle Mull, MD [facility] 08/12/2019 7:45 PM  To reach On-call, see care teams to locate the attending and reach out via [email or web address]. Between 7PM-7AM, please contact night-coverage If you still have difficulty reaching the attending provider, please page the [facility] (Director on Call) for [facility] on amion for assistance.

## 2019-08-12 NOTE — Evaluation (Signed)
Physical Therapy Evaluation Patient Details Name: Kathy Pierce MRN: [id number] DOB: 12-Aug-1925 Today's Date: 08/12/2019   History of Present Illness  86 yo female admitted after fall resulting in complex L hip fracture. S/p IM L LE. Marland Kitchen Hx of COPD, lung ca, depression, osteoporosis, squamous cell cancer, spinal stenosis, non healing L leg wound  Clinical Impression  Pt admitted with above diagnosis.  Pt is very independent at her baseline. She would like to go home however is willing to go to rehab/STSNF.  Will continue to follow in acute setting. Given pt decr activity tolerance and that this is her 2nd recent admission, recommend SNF, will monitor progress and if does very well and has incr assist at home--would possibly be able to d/c home with HHPT.   Pt currently with functional limitations due to the deficits listed below (see PT Problem List). Pt will benefit from skilled PT to increase their independence and safety with mobility to allow discharge to the venue listed below.       Follow Up Recommendations SNF    Equipment Recommendations  None recommended by PT    Recommendations for Other Services       Precautions / Restrictions Precautions Precautions: Fall Restrictions Weight Bearing Restrictions: No LLE Weight Bearing: Weight bearing as tolerated Other Position/Activity Restrictions: WBAT      Mobility  Bed Mobility Overal bed mobility: Needs Assistance Bed Mobility: Supine to Sit     Supine to sit: Mod assist;HOB elevated     General bed mobility comments: OOB in chair  Transfers Overall transfer level: Needs assistance Equipment used: Rolling walker (2 wheeled) Transfers: Sit to/from Stand Sit to Stand: Min assist Stand pivot transfers: Min assist       General transfer comment: cues for hand placement  Ambulation/Gait Ambulation/Gait assistance: Min guard;Min assist Gait Distance (Feet): 15 Feet Assistive device: Rolling walker (2 wheeled) Gait  Pattern/deviations: Step-to pattern;Decreased stance time - left;Decreased step length - left     General Gait Details: multi-modal cues for sequence, RW position and step length, fatigued after distance above requiring seated rest  Stairs            Wheelchair Mobility    Modified Rankin (Stroke Patients Only)       Balance Overall balance assessment: Needs assistance Sitting-balance support: Bilateral upper extremity supported;Feet supported Sitting balance-Leahy Scale: Good     Standing balance support: Bilateral upper extremity supported;During functional activity Standing balance-Leahy Scale: Poor Standing balance comment: reliant on walker                             Pertinent Vitals/Pain Pain Assessment: 0-10 Pain Score: 5  Pain Location: L LE Pain Descriptors / Indicators: Grimacing;Guarding;Discomfort Pain Intervention(s): Limited activity within patient's tolerance;Monitored during session;Ice applied;Premedicated before session    Home Living Family/patient expects to be discharged to:: Private residence Living Arrangements: Alone Available Help at Discharge: Family;Friend(s);Other (Comment) Type of Home: House Home Access: Level entry     Home Layout: One level Home Equipment: Cane - single point;Grab bars - tub/shower;Shower seat;Walker - 2 wheels      Prior Function Level of Independence: Independent   Gait / Transfers Assistance Needed: ambulates with a cane  ADL's / Homemaking Assistance Needed: cleaning person 1x/week, otherwise I with self care  Comments: still drives     Hand Dominance   Dominant Hand: Right    Extremity/Trunk Assessment   Upper Extremity Assessment Upper  Extremity Assessment: Defer to OT evaluation;Generalized weakness    Lower Extremity Assessment Lower Extremity Assessment: LLE deficits/detail LLE Deficits / Details: AAROM grossly WFL hip and knee, limited by post op pain and weakness     Cervical / Trunk Assessment Cervical / Trunk Assessment: Kyphotic  Communication   Communication: No difficulties  Cognition Arousal/Alertness: Awake/alert Behavior During Therapy: WFL for tasks assessed/performed Overall Cognitive Status: Within Functional Limits for tasks assessed                                        General Comments      Exercises     Assessment/Plan    PT Assessment Patient needs continued PT services  PT Problem List Decreased strength;Decreased mobility;Decreased activity tolerance;Decreased balance;Decreased knowledge of use of DME;Decreased skin integrity       PT Treatment Interventions DME instruction;Gait training;Therapeutic activities;Therapeutic exercise;Patient/family education;Balance training;Functional mobility training    PT Goals (Current goals can be found in the Care Plan section)  Acute Rehab PT Goals Patient Stated Goal: "if I go to rehab I would like [facility]" PT Goal Formulation: With patient Time For Goal Achievement: 08/26/19 Potential to Achieve Goals: Good    Frequency Min 3X/week   Barriers to discharge        Co-evaluation               AM-PAC PT "6 Clicks" Mobility  Outcome Measure Help needed turning from your back to your side while in a flat bed without using bedrails?: A Little Help needed moving from lying on your back to sitting on the side of a flat bed without using bedrails?: A Little Help needed moving to and from a bed to a chair (including a wheelchair)?: A Little Help needed standing up from a chair using your arms (e.g., wheelchair or bedside chair)?: A Little Help needed to walk in hospital room?: A Little Help needed climbing 3-5 steps with a railing? : A Lot 6 Click Score: 17    End of Session Equipment Utilized During Treatment: Gait belt Activity Tolerance: Patient tolerated treatment well;Patient limited by fatigue Patient left: in chair;with call bell/phone within  reach;with chair alarm set   PT Visit Diagnosis: Unsteadiness on feet (R26.81);Muscle weakness (generalized) (M62.81)    Time: 2446-2863 PT Time Calculation (min) (ACUTE ONLY): 24 min   Charges:   PT Evaluation $PT Eval Low Complexity: 1 Low PT Treatments $Gait Training: 8-22 mins        Baxter Flattery, PT  Acute Rehab Dept ([place]) [phone] Pager [phone]  08/12/2019   [facility] 08/12/2019, 2:02 PM

## 2019-08-12 NOTE — TOC Initial Note (Signed)
Transition of Care [facility]) - Initial/Assessment Note    Patient Details  Name: Kathy Pierce MRN: [id number] Date of Birth: 09-20-1925  Transition of [facility]) CM/SW Contact:    Lennart Pall, LCSW Phone Number: 08/12/2019, 5:37 PM  Clinical Narrative:      Met with pt today to discuss d/c plans. Pt is very much in agreement with PT recommendation for SNF for rehab.  She prefers [facility] if a bed is available.  She is very pleasant and notes that her two local granddaughters are her supports.  Have sent FL2 out and weekend [facility] team to monitor for bed offers.             Expected Discharge Plan: Skilled Nursing Facility Barriers to Discharge: Continued Medical Work up   Patient Goals and CMS Choice Patient states their goals for this hospitalization and ongoing recovery are:: go to short term rehab and then home      Expected Discharge Plan and Services Expected Discharge Plan: [place] Acute Care Choice: [place] arrangements for the past 2 months: Single Family Home Expected Discharge Date:  (unknown)                                    Prior Living Arrangements/Services Living arrangements for the past 2 months: Single Family Home Lives with:: Self Patient language and need for interpreter reviewed:: Yes Do you feel safe going back to the place where you live?: No   not yet - wants ST SNF  Need for Family Participation in Patient Care: No (Comment) Care giver support system in place?: No (comment)   Criminal Activity/Legal Involvement Pertinent to Current Situation/Hospitalization: No - Comment as needed  Activities of Daily Living Home Assistive Devices/Equipment: Dentures (specify type), Eyeglasses, Shower chair with back, Walker (specify type), Other (Comment), Oxygen (tub/shower, front wheeled walker, upper/lower dentures) ADL Screening (condition at time of admission) Patient's cognitive ability adequate to  safely complete daily activities?: Yes Is the patient deaf or have difficulty hearing?: No Does the patient have difficulty seeing, even when wearing glasses/contacts?: No Does the patient have difficulty concentrating, remembering, or making decisions?: No Patient able to express need for assistance with ADLs?: Yes Does the patient have difficulty dressing or bathing?: Yes Independently performs ADLs?: No Communication: Independent Is this a change from baseline?: Pre-admission baseline Dressing (OT): Needs assistance Is this a change from baseline?: Change from baseline, expected to last >3 days Grooming: Needs assistance Is this a change from baseline?: Change from baseline, expected to last >3 days Feeding: Needs assistance Is this a change from baseline?: Change from baseline, expected to last >3 days Bathing: Needs assistance Is this a change from baseline?: Change from baseline, expected to last >3 days Toileting: Dependent Is this a change from baseline?: Change from baseline, expected to last >3days In/Out Bed: Dependent Is this a change from baseline?: Change from baseline, expected to last >3 days Walks in Home: Dependent Is this a change from baseline?: Change from baseline, expected to last >3 days Does the patient have difficulty walking or climbing stairs?: Yes (secondary to left hip pain) Weakness of Legs: Left Weakness of Arms/Hands: None  Permission Sought/Granted Permission sought to share information with : Case Manager                Emotional Assessment Appearance:: Appears stated age Attitude/Demeanor/Rapport: Engaged,  Gracious Affect (typically observed): Accepting Orientation: : Oriented to Self, Oriented to Place, Oriented to  Time, Oriented to Situation Alcohol / Substance Use: Not Applicable Psych Involvement: No (comment)  Admission diagnosis:  Hip fracture ([place]) [S72.009A] Fall [W19.XXXA] Closed nondisplaced intertrochanteric fracture of left  femur, initial encounter [facility]) [S72.145A] Patient Active Problem List   Diagnosis Date Noted  . Hip fracture ([place]) 08/11/2019  . Congestive heart failure (CHF) ([place]) 07/26/2019  . Chronic obstructive pulmonary disease ([place]) 12/18/2017  . Essential hypertension 12/18/2017  . Major depression 12/18/2017  . Osteoporosis 12/18/2017  . Vitamin B deficiency 12/18/2017  . Essential thrombocythemia ([place]) 11/18/2016  . 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 Conductive hearing loss in right ear 04/25/2015  . Cerumen impaction 04/25/2015  . Sciatica of left side 06/16/2012  . Spinal stenosis of lumbar region 06/16/2012   PCP:  London Pepper, MD Pharmacy:   [place], [place] - [address] [place] [place] [address] Phone: [phone] Fax: [phone]     Social Determinants of Health (SDOH) Interventions    Readmission Risk Interventions Readmission Risk Prevention Plan 07/29/2019  Transportation Screening Complete  Home Care Screening Complete  Medication Review (RN CM) Complete  Some recent data might be hidden

## 2019-08-13 LAB — CBC
HCT: 25.7 % — ABNORMAL LOW (ref 36.0–46.0)
Hemoglobin: 8.3 g/dL — ABNORMAL LOW (ref 12.0–15.0)
MCH: 36.7 pg — ABNORMAL HIGH (ref 26.0–34.0)
MCHC: 32.3 g/dL (ref 30.0–36.0)
MCV: 113.7 fL — ABNORMAL HIGH (ref 80.0–100.0)
Platelets: 479 10*3/uL — ABNORMAL HIGH (ref 150–400)
RBC: 2.26 MIL/uL — ABNORMAL LOW (ref 3.87–5.11)
RDW: 14.9 % (ref 11.5–15.5)
WBC: 7.4 10*3/uL (ref 4.0–10.5)
nRBC: 0 % (ref 0.0–0.2)

## 2019-08-13 LAB — BASIC METABOLIC PANEL
Anion gap: 8 (ref 5–15)
BUN: 31 mg/dL — ABNORMAL HIGH (ref 8–23)
CO2: 25 mmol/L (ref 22–32)
Calcium: 8.4 mg/dL — ABNORMAL LOW (ref 8.9–10.3)
Chloride: 99 mmol/L (ref 98–111)
Creatinine, Ser: 0.94 mg/dL (ref 0.44–1.00)
GFR calc Af Amer: >60 mL/min (ref >60)
GFR calc non Af Amer: 53 mL/min — ABNORMAL LOW (ref >60)
Glucose, Bld: 114 mg/dL — ABNORMAL HIGH (ref 70–99)
Potassium: 4.3 mmol/L (ref 3.5–5.1)
Sodium: 132 mmol/L — ABNORMAL LOW (ref 135–145)

## 2019-08-13 MED ORDER — FERROUS SULFATE 325 (65 FE) MG PO TABS: 325.0000 mg | ORAL_TABLET | Freq: Two times a day (BID) | ORAL | Status: DC

## 2019-08-13 MED ORDER — FUROSEMIDE 20 MG PO TABS
20.0000 mg | ORAL_TABLET | ORAL | Status: DC
  Administered 2019-08-13: 20 mg via ORAL
  Filled 2019-08-13: qty 1

## 2019-08-13 MED ORDER — B COMPLEX-C PO TABS
1.0000 | ORAL_TABLET | Freq: Every day | ORAL | Status: DC
  Administered 2019-08-14: 1 via ORAL
  Filled 2019-08-13: qty 1

## 2019-08-13 NOTE — Progress Notes (Signed)
Patient stable. Dressings benign NVI   Plan WBAT Ok for dispo from ortho standpoint

## 2019-08-13 NOTE — Progress Notes (Signed)
Physical Therapy Treatment Patient Details Name: Kathy Pierce MRN: [id number] DOB: May 13, 1925 Today's Date: 08/13/2019    History of Present Illness 86 yo female admitted after fall resulting in complex L hip fracture. S/p IM L LE. Marland Kitchen Hx of COPD, lung ca, depression, osteoporosis, squamous cell cancer, spinal stenosis, non healing L leg wound    PT Comments    Pt motivated and able to ambulate increased distance with assistance but continues high fall risk and fatigues easily.   Follow Up Recommendations  SNF     Equipment Recommendations  None recommended by PT    Recommendations for Other Services       Precautions / Restrictions Precautions Precautions: Fall Precaution Comments: monitor O2 Restrictions Weight Bearing Restrictions: No LLE Weight Bearing: Weight bearing as tolerated    Mobility  Bed Mobility               General bed mobility comments: Pt up in chair and requests back to same to eat lunch  Transfers Overall transfer level: Needs assistance Equipment used: Rolling walker (2 wheeled) Transfers: Sit to/from Stand Sit to Stand: Min assist;Mod assist         General transfer comment: cues for LE management and use of UEs to self assist;  Physical assist to bring wt up and fwd  Ambulation/Gait Ambulation/Gait assistance: Min guard;Min assist Gait Distance (Feet): 28 Feet Assistive device: Rolling walker (2 wheeled) Gait Pattern/deviations: Step-to pattern;Decreased stance time - left;Decreased step length - left     General Gait Details: multi-modal cues for sequence, RW position and step length, fatigued after distance above requiring seated rest   Stairs             Wheelchair Mobility    Modified Rankin (Stroke Patients Only)       Balance Overall balance assessment: Needs assistance Sitting-balance support: Feet supported Sitting balance-Leahy Scale: Good     Standing balance support: Bilateral upper extremity  supported;During functional activity Standing balance-Leahy Scale: Poor Standing balance comment: reliant on walker                            Cognition Arousal/Alertness: Awake/alert Behavior During Therapy: WFL for tasks assessed/performed Overall Cognitive Status: Within Functional Limits for tasks assessed                                        Exercises      General Comments General comments (skin integrity, edema, etc.): Pt uses O2 for activity at home but states does not need it at rest      Pertinent Vitals/Pain Pain Assessment: 0-10 Pain Score: 5  Pain Location: L LE Pain Descriptors / Indicators: Grimacing;Guarding;Discomfort Pain Intervention(s): Limited activity within patient's tolerance;Monitored during session;Premedicated before session    Home Living                      Prior Function            PT Goals (current goals can now be found in the care plan section) Acute Rehab PT Goals Patient Stated Goal: "if I go to rehab I would like [facility]" PT Goal Formulation: With patient Time For Goal Achievement: 08/26/19 Potential to Achieve Goals: Good Progress towards PT goals: Progressing toward goals    Frequency    Min 3X/week  PT Plan Current plan remains appropriate    Co-evaluation              AM-PAC PT "6 Clicks" Mobility   Outcome Measure  Help needed turning from your back to your side while in a flat bed without using bedrails?: A Little Help needed moving from lying on your back to sitting on the side of a flat bed without using bedrails?: A Little Help needed moving to and from a bed to a chair (including a wheelchair)?: A Little Help needed standing up from a chair using your arms (e.g., wheelchair or bedside chair)?: A Little Help needed to walk in hospital room?: A Little Help needed climbing 3-5 steps with a railing? : A Lot 6 Click Score: 17    End of Session Equipment Utilized  During Treatment: Gait belt Activity Tolerance: Patient tolerated treatment well;Patient limited by fatigue Patient left: in chair;with call bell/phone within reach;with chair alarm set;with nursing/sitter in room Nurse Communication: Mobility status PT Visit Diagnosis: Unsteadiness on feet (R26.81);Muscle weakness (generalized) (M62.81)     Time: 7616-0737 PT Time Calculation (min) (ACUTE ONLY): 19 min  Charges:  $Gait Training: 8-22 mins                     [place] Pager [phone] Office [phone]    Chauncey Bruno 08/13/2019, 4:47 PM

## 2019-08-13 NOTE — Plan of Care (Signed)
  Problem: Clinical Measurements: Goal: Diagnostic test results will improve Outcome: Progressing   Problem: Clinical Measurements: Goal: Respiratory complications will improve Outcome: Progressing   Problem: Clinical Measurements: Goal: Cardiovascular complication will be avoided Outcome: Progressing   Problem: Nutrition: Goal: Adequate nutrition will be maintained Outcome: Progressing   Problem: Elimination: Goal: Will not experience complications related to bowel motility Outcome: Progressing   Problem: Pain Managment: Goal: General experience of comfort will improve Outcome: Progressing

## 2019-08-13 NOTE — Progress Notes (Signed)
Not helpful [facility] Progress Note  Patient: Kathy Pierce    UVO:[id number]  DOA: 08/11/2019     Date of Service: the patient was seen and examined on 08/13/2019  Brief hospital course: Kathy Pierce is a 86 y.o. female with Past medical history of recent admission for HFpEF, COPD, HTN, prior history of lung cancer, left leg squamous cell carcinoma, depression, osteoporosis, B12 deficiency, underweight, hypothyroidism. Presented with a mechanical fall.  Found to have intertrochanteric fracture of the left hip. Underwent intramedullary nail implant on 08/11/2019. Currently plan is monitor postoperative recovery.  Assessment and Plan: 1.  Left intertrochanteric hip fracture. After mechanical fall. Orthopedic consulted scheduled for surgery on 08/11/2019. Pain control per orthopedic. Patient is cleared for surgery from medical perspective with low risk. Her primary risk is actually her age only. PT OT and DVT prophylaxis per orthopedic as well.  2.  Mechanical fall CT head and C-spine unremarkable. No acute complaint. Monitor.  PT OT consultation postop.  3. Preoperative medical evaluation Low risk. Tolerated surgery very well.  4.  Postoperative acute blood loss anemia. Chronic microcytosis from hydroxyurea. Hemoglobin dropped from 12-9.9, continues to drop. Monitor. Transfuse for hemoglobin less than 8. May require discontinuation of the hydroxyurea.  5.  Chronic HFpEF. Chronic respiratory failure. History of COPD Essential hypertension Continue inhalers. Holding Lasix for now Likely will resume tomorrow. Continue home oxygen.  6.  Left lower lobe cancer SP radiation 2018. On observation. On hydroxyurea. Monitor while patient being monitored for anemia.  7.  Depression. Currently stable. Continue Zoloft.   Diet: Regular diet DVT Prophylaxis:   enoxaparin (LOVENOX) injection 40 mg Start: 08/12/19 0800 SCDs Start: 08/11/19 2117 SCDs Start: 08/11/19  1810 Place TED hose Start: 08/11/19 1810    Advance goals of care discussion: DNR  Family Communication: no family was present at bedside, at the time of interview.   Disposition:  Status is: Inpatient  Remains inpatient appropriate because:Ongoing diagnostic testing needed not appropriate for outpatient work up   Dispo:  Patient From: Home  Planned Disposition: [place]  Expected discharge date: 08/14/19  Medically stable for discharge: No         Subjective: Pain remains well controlled.  No nausea no vomiting.  No fever no chills.  Physical Exam:  General: Appear in mild distress, no Rash; Oral Mucosa Clear, moist. no Abnormal Neck Mass Or lumps, Conjunctiva normal  Cardiovascular: S1 and S2 Present, no Murmur, Respiratory: good respiratory effort, Bilateral Air entry present and Clear to Auscultation, no Crackles, no wheezes Abdomen: Bowel Sound present, Soft and no tenderness Extremities: no Pedal edema, no calf tenderness Neurology: alert and oriented to time, place, and person affect appropriate. no new focal deficit Gait not checked due to patient safety concerns  Vitals:   08/13/19 0844 08/13/19 0940 08/13/19 1219 08/13/19 1408  BP:  (!) 98/59 108/80 (!) 90/58  Pulse:  73  67  Resp:  18 18 15   Temp:  97.8 F (36.6 C)  (!) 97.5 F (36.4 C)  TempSrc:    Oral  SpO2: 93% 100%  100%  Weight:      Height:        Intake/Output Summary (Last 24 hours) at 08/13/2019 1924 Last data filed at 08/13/2019 1835 Gross per 24 hour  Intake 2160 ml  Output 1475 ml  Net 685 ml   Filed Weights   08/11/19 2100  Weight: 49 kg    Data Reviewed: I have personally reviewed and  interpreted daily labs, tele strips, imagings as discussed above. I reviewed all nursing notes, pharmacy notes, vitals, pertinent old records I have discussed plan of care as described above with RN and patient/family.  CBC: Recent Labs  Lab 08/11/19 1157 08/12/19 0326  08/13/19 0332  WBC 12.5* 8.9 7.4  NEUTROABS 10.6*  --   --   HGB 12.4 9.9* 8.3*  HCT 38.3 31.1* 25.7*  MCV 113.6* 115.2* 113.7*  PLT 649* 512* 850*   Basic Metabolic Panel: Recent Labs  Lab 08/11/19 1157 08/12/19 0325 08/12/19 0326 08/13/19 0332  NA 136 135  --  132*  K 4.0 4.3  --  4.3  CL 97* 101  --  99  CO2 27 25  --  25  GLUCOSE 137* 124*  --  114*  BUN 23 24*  --  31*  CREATININE 0.79 0.77 0.67 0.94  CALCIUM 9.0 8.5*  --  8.4*  MG  --  1.9  --   --     Studies: No results found.  Scheduled Meds: . acetaminophen  1,000 mg Oral Q8H  . atenolol  25 mg Oral Daily  . [START ON 08/14/2019] B-complex with vitamin C  1 tablet Oral Daily  . docusate sodium  100 mg Oral BID  . enoxaparin (LOVENOX) injection  40 mg Subcutaneous Q24H  . [START ON 08/14/2019] ferrous sulfate  325 mg Oral BID WC  . furosemide  20 mg Oral QODAY  . hydroxyurea  500 mg Oral Q48H   And  . hydroxyurea  1,000 mg Oral Q48H  . levothyroxine  25 mcg Oral Q0600  . polyethylene glycol  17 g Oral Daily  . sertraline  50 mg Oral Daily  . umeclidinium-vilanterol  1 puff Inhalation Daily   Continuous Infusions: PRN Meds: albuterol, bisacodyl, HYDROmorphone (DILAUDID) injection, menthol-cetylpyridinium **OR** phenol, metoCLOPramide **OR** metoCLOPramide (REGLAN) injection, ondansetron **OR** ondansetron (ZOFRAN) IV, oxyCODONE, oxyCODONE, polyvinyl alcohol, senna-docusate  Time spent: 35 minutes  Author: Berle Mull, MD [facility] 08/13/2019 7:24 PM  To reach On-call, see care teams to locate the attending and reach out via [email or web address]. Between 7PM-7AM, please contact night-coverage If you still have difficulty reaching the attending provider, please page the [facility] (Director on Call) for [facility] on amion for assistance.

## 2019-08-13 NOTE — TOC Progression Note (Addendum)
Transition of Care [facility]) - Progression Note    Patient Details  Name: Kathy Pierce MRN: [id number] Date of Birth: 11-27-1925  [facility]) CM/SW Contact  Ross Ludwig, [place] Phone Number: 08/13/2019, 2:03 PM  Clinical Narrative:     CSW faxed clinicals to other SNFs awaiting for bed offers.  Patient has [employer] and will need insurance authorization before she is able to go to a SNF.  CSW started insurance authorization, patient will be presented bed offers once they are received.  CSW to continue to follow patient's progress throughout discharge planning.   Expected Discharge Plan: [place] Barriers to Discharge: Continued Medical Work up  Expected Discharge Plan and Services Expected Discharge Plan: [place] Choice: [place] arrangements for the past 2 months: Single Family Home Expected Discharge Date:  (unknown)                                     Social Determinants of Health (SDOH) Interventions    Readmission Risk Interventions Readmission Risk Prevention Plan 07/29/2019  Transportation Screening Complete  Home Care Screening Complete  Medication Review (RN CM) Complete  Some recent data might be hidden

## 2019-08-14 LAB — IRON AND TIBC
Iron: 22 ug/dL — ABNORMAL LOW (ref 28–170)
Saturation Ratios: 10 % — ABNORMAL LOW (ref 10.4–31.8)
TIBC: 214 ug/dL — ABNORMAL LOW (ref 250–450)
UIBC: 192 ug/dL

## 2019-08-14 LAB — CBC
HCT: 25.7 % — ABNORMAL LOW (ref 36.0–46.0)
Hemoglobin: 8.3 g/dL — ABNORMAL LOW (ref 12.0–15.0)
MCH: 36.6 pg — ABNORMAL HIGH (ref 26.0–34.0)
MCHC: 32.3 g/dL (ref 30.0–36.0)
MCV: 113.2 fL — ABNORMAL HIGH (ref 80.0–100.0)
Platelets: 549 10*3/uL — ABNORMAL HIGH (ref 150–400)
RBC: 2.27 MIL/uL — ABNORMAL LOW (ref 3.87–5.11)
RDW: 15.2 % (ref 11.5–15.5)
WBC: 8.5 10*3/uL (ref 4.0–10.5)
nRBC: 0 % (ref 0.0–0.2)

## 2019-08-14 LAB — BASIC METABOLIC PANEL
Anion gap: 7 (ref 5–15)
BUN: 25 mg/dL — ABNORMAL HIGH (ref 8–23)
CO2: 26 mmol/L (ref 22–32)
Calcium: 8.4 mg/dL — ABNORMAL LOW (ref 8.9–10.3)
Chloride: 101 mmol/L (ref 98–111)
Creatinine, Ser: 0.97 mg/dL (ref 0.44–1.00)
GFR calc Af Amer: 59 mL/min — ABNORMAL LOW (ref >60)
GFR calc non Af Amer: 51 mL/min — ABNORMAL LOW (ref >60)
Glucose, Bld: 125 mg/dL — ABNORMAL HIGH (ref 70–99)
Potassium: 4.4 mmol/L (ref 3.5–5.1)
Sodium: 134 mmol/L — ABNORMAL LOW (ref 135–145)

## 2019-08-14 LAB — VITAMIN B12: Vitamin B-12: 823 pg/mL (ref 180–914)

## 2019-08-14 LAB — FOLATE: Folate: 4.8 ng/mL — ABNORMAL LOW (ref >5.9)

## 2019-08-14 MED ORDER — FERROUS SULFATE 325 (65 FE) MG PO TABS
325.0000 mg | ORAL_TABLET | Freq: Every day | ORAL | Status: DC
  Administered 2019-08-15: 325 mg via ORAL
  Filled 2019-08-14: qty 1

## 2019-08-14 MED ORDER — FOLIC ACID 1 MG PO TABS
1.0000 mg | ORAL_TABLET | Freq: Every day | ORAL | Status: DC
  Administered 2019-08-14 – 2019-08-15 (×2): 1 mg via ORAL
  Filled 2019-08-14 (×2): qty 1

## 2019-08-14 MED ORDER — SODIUM CHLORIDE 0.9 % IV SOLN
510.0000 mg | Freq: Once | INTRAVENOUS | Status: AC
  Administered 2019-08-14: 510 mg via INTRAVENOUS
  Filled 2019-08-14: qty 510

## 2019-08-14 NOTE — TOC Progression Note (Addendum)
Transition of Care [facility]) - Progression Note    Patient Details  Name: LATRIA MCCARRON MRN: [id number] Date of Birth: 23-Mar-1925  Transition of Care [facility]) CM/SW [place], [place] Phone Number: [phone] 08/14/2019, 10:30 AM  Clinical Narrative:     CSW spoke with Abigail Butts at [facility] [phone] she stated they do not have a bed available today but they may have a bed available tomorrow 08/15/2019.  Abigail Butts requested CSW contact [facility] tomorrow for bed availability.  2:45PM - CSW spoke with patient about placement at [facility] and let her know may be a bed available tomorrow.  Patient stated to CSW that she would like a private room and she would be happy to pay the difference out of pocket.  CSw spoke with Abigail Butts at [facility] and she stated the all her short term rooms are private.  Abigail Butts stated she would leave a message for [place] about insurance authorization and bed availability so she could update CSW tomorrow.  Expected Discharge Plan: [place] Barriers to Discharge: Continued Medical Work up  Expected Discharge Plan and Services Expected Discharge Plan: [place] Choice: [place] arrangements for the past 2 months: Single Family Home Expected Discharge Date:  (unknown)                                     Social Determinants of Health (SDOH) Interventions    Readmission Risk Interventions Readmission Risk Prevention Plan 07/29/2019  Transportation Screening Complete  Home Care Screening Complete  Medication Review (RN CM) Complete  Some recent data might be hidden

## 2019-08-14 NOTE — TOC Progression Note (Signed)
Transition of Care [facility]) - Progression Note    Patient Details  Name: AMAIYAH NORDHOFF MRN: [id number] Date of Birth: [birth year]  Transition of Care [facility]) CM/SW [place], [place] Phone Number: [phone] 08/14/2019, 3:44 PM  Clinical Narrative:     CSW spoke with patient about disposition and patient would like for her friend Lynnette Caffey [phone] to assist her when she is ready for d/c.  CSW Spoke with Anderson Malta RN and she stated she think Ms. Mariea Clonts will be able to come and assist patient with preparation for d/c to [facility].  CSW updated patient.  Patient verbalized understanding.  Expected Discharge Plan: [place] Barriers to Discharge: Continued Medical Work up  Expected Discharge Plan and Services Expected Discharge Plan: [place] Choice: [place] arrangements for the past 2 months: Single Family Home Expected Discharge Date:  (unknown)                                     Social Determinants of Health (SDOH) Interventions    Readmission Risk Interventions Readmission Risk Prevention Plan 07/29/2019  Transportation Screening Complete  Home Care Screening Complete  Medication Review (RN CM) Complete  Some recent data might be hidden

## 2019-08-14 NOTE — Progress Notes (Signed)
Dr. Posey Pronto notified of RN holding atenolol this am due to sbp of 95. Pt with no s/s of low bp and reports feeling well overall. Family at bedside.

## 2019-08-14 NOTE — Progress Notes (Signed)
Not helpful [facility] Progress Note  Patient: Kathy Pierce    KYH:[id number]  DOA: 08/11/2019     Date of Service: the patient was seen and examined on 08/14/2019  Brief hospital course: Kathy Pierce is a 86 y.o. female with Past medical history of recent admission for HFpEF, COPD, HTN, prior history of lung cancer, left leg squamous cell carcinoma, depression, osteoporosis, B12 deficiency, underweight, hypothyroidism. Presented with a mechanical fall.  Found to have intertrochanteric fracture of the left hip. Underwent intramedullary nail implant on 08/11/2019. Currently plan is monitor postoperative recovery.  Assessment and Plan: 1.  Left intertrochanteric hip fracture. After mechanical fall. Orthopedic consulted scheduled for surgery on 08/11/2019. Pain control per orthopedic. Patient is cleared for surgery from medical perspective with low risk. Her primary risk is actually her age only. PT OT and DVT prophylaxis per orthopedic as well.  2.  Mechanical fall CT head and C-spine unremarkable. No acute complaint. Monitor.  PT OT consultation postop.  3. Preoperative medical evaluation Low risk. Tolerated surgery very well.  4.  Postoperative acute blood loss anemia. Chronic microcytosis from hydroxyurea. Iron deficiency Hemoglobin dropped from 12-9.9, continues to drop.  Now stable Monitor. Transfuse for hemoglobin less than 8. Iron level low.  Will provide 1 dose of IV Feraheme. Also folic acid and T51. May require discontinuation of the hydroxyurea.  5.  Chronic HFpEF. Chronic respiratory failure. History of COPD Essential hypertension Continue inhalers. Lasix resume.  Monitor. Continue home oxygen.  6.  Left lower lobe cancer SP radiation 2018. On observation. On hydroxyurea. Monitor while patient being monitored for anemia.  7.  Depression. Currently stable. Continue Zoloft.  Diet: Regular diet DVT Prophylaxis:   enoxaparin (LOVENOX) injection  40 mg Start: 08/12/19 0800 SCDs Start: 08/11/19 2117 SCDs Start: 08/11/19 1810 Place TED hose Start: 08/11/19 1810    Advance goals of care discussion: DNR  Family Communication: no family was present at bedside, at the time of interview.   Disposition:  Status is: Inpatient  Remains inpatient appropriate because:Ongoing diagnostic testing needed not appropriate for outpatient work up   Dispo:  Patient From: Home  Planned Disposition: [place]  Expected discharge date: 08/14/19  Medically stable for discharge: No         Subjective: Pain well controlled.  No nausea no vomiting.  Patient is able to ambulate.  No fever no chills.  Physical Exam:  General: Appear in mild distress, no Rash; Oral Mucosa Clear, moist. no Abnormal Neck Mass Or lumps, Conjunctiva normal  Cardiovascular: S1 and S2 Present, no Murmur, Respiratory: good respiratory effort, Bilateral Air entry present and Clear to Auscultation, no Crackles, no wheezes Abdomen: Bowel Sound present, Soft and no tenderness Extremities: no Pedal edema, no calf tenderness Neurology: alert and oriented to time, place, and person affect appropriate. no new focal deficit Gait not checked due to patient safety concerns  Vitals:   08/13/19 2146 08/14/19 0623 08/14/19 1230 08/14/19 1318  BP: (!) 110/54 (!) 95/44 107/60 114/61  Pulse: 83 91  72  Resp: 16 17 18 16   Temp: 97.8 F (36.6 C) 97.9 F (36.6 C)  97.6 F (36.4 C)  TempSrc: Oral Oral    SpO2: 94% 92%  100%  Weight:      Height:        Intake/Output Summary (Last 24 hours) at 08/14/2019 1954 Last data filed at 08/14/2019 1754 Gross per 24 hour  Intake 1737 ml  Output 1350 ml  Net 387  ml   Filed Weights   08/11/19 2100  Weight: 49 kg    Data Reviewed: I have personally reviewed and interpreted daily labs, tele strips, imagings as discussed above. I reviewed all nursing notes, pharmacy notes, vitals, pertinent old records I have  discussed plan of care as described above with RN and patient/family.  CBC: Recent Labs  Lab 08/11/19 1157 08/12/19 0326 08/13/19 0332 08/14/19 0351  WBC 12.5* 8.9 7.4 8.5  NEUTROABS 10.6*  --   --   --   HGB 12.4 9.9* 8.3* 8.3*  HCT 38.3 31.1* 25.7* 25.7*  MCV 113.6* 115.2* 113.7* 113.2*  PLT 649* 512* 479* 697*   Basic Metabolic Panel: Recent Labs  Lab 08/11/19 1157 08/12/19 0325 08/12/19 0326 08/13/19 0332 08/14/19 0351  NA 136 135  --  132* 134*  K 4.0 4.3  --  4.3 4.4  CL 97* 101  --  99 101  CO2 27 25  --  25 26  GLUCOSE 137* 124*  --  114* 125*  BUN 23 24*  --  31* 25*  CREATININE 0.79 0.77 0.67 0.94 0.97  CALCIUM 9.0 8.5*  --  8.4* 8.4*  MG  --  1.9  --   --   --     Studies: No results found.  Scheduled Meds: . acetaminophen  1,000 mg Oral Q8H  . atenolol  25 mg Oral Daily  . docusate sodium  100 mg Oral BID  . enoxaparin (LOVENOX) injection  40 mg Subcutaneous Q24H  . [START ON 08/15/2019] ferrous sulfate  325 mg Oral Q breakfast  . folic acid  1 mg Oral Daily  . furosemide  20 mg Oral QODAY  . hydroxyurea  500 mg Oral Q48H   And  . hydroxyurea  1,000 mg Oral Q48H  . levothyroxine  25 mcg Oral Q0600  . polyethylene glycol  17 g Oral Daily  . sertraline  50 mg Oral Daily  . umeclidinium-vilanterol  1 puff Inhalation Daily   Continuous Infusions: PRN Meds: albuterol, bisacodyl, HYDROmorphone (DILAUDID) injection, menthol-cetylpyridinium **OR** phenol, ondansetron **OR** ondansetron (ZOFRAN) IV, oxyCODONE, oxyCODONE, polyvinyl alcohol, senna-docusate  Time spent: 35 minutes  Author: Berle Mull, MD [facility] 08/14/2019 7:54 PM  To reach On-call, see care teams to locate the attending and reach out via [email or web address]. Between 7PM-7AM, please contact night-coverage If you still have difficulty reaching the attending provider, please page the [facility] (Director on Call) for [facility] on amion for assistance.

## 2019-08-14 NOTE — Progress Notes (Addendum)
Subjective: 3 Days Post-Op s/p Procedure(s): INTRAMEDULLARY (IM) NAIL INTERTROCHANTRIC  Mild pain at left hip. No other complaints.   Objective:  PE: VITALS:   Vitals:   08/13/19 1219 08/13/19 1408 08/13/19 2146 08/14/19 0623  BP: 108/80 (!) 90/58 (!) 110/54 (!) 95/44  Pulse:  67 83 91  Resp: 18 15 16 17   Temp:  (!) 97.5 F (36.4 C) 97.8 F (36.6 C) 97.9 F (36.6 C)  TempSrc:  Oral Oral Oral  SpO2:  100% 94% 92%  Weight:      Height:       Sitting up in chair, in no acute distress Neurovascular intact Sensation intact distally Intact pulses distally Dorsiflexion/Plantar flexion intact Incision: dressing C/D/I  LABS  Results for orders placed or performed during the hospital encounter of 08/11/19 (from the past 24 hour(s))  Basic metabolic panel     Status: Abnormal   Collection Time: 08/14/19  3:51 AM  Result Value Ref Range   Sodium 134 (L) 135 - 145 mmol/L   Potassium 4.4 3.5 - 5.1 mmol/L   Chloride 101 98 - 111 mmol/L   CO2 26 22 - 32 mmol/L   Glucose, Bld 125 (H) 70 - 99 mg/dL   BUN 25 (H) 8 - 23 mg/dL   Creatinine, Ser 0.97 0.44 - 1.00 mg/dL   Calcium 8.4 (L) 8.9 - 10.3 mg/dL   GFR calc non Af Amer 51 (L) >60 mL/min   GFR calc Af Amer 59 (L) >60 mL/min   Anion gap 7 5 - 15  CBC     Status: Abnormal   Collection Time: 08/14/19  3:51 AM  Result Value Ref Range   WBC 8.5 4.0 - 10.5 K/uL   RBC 2.27 (L) 3.87 - 5.11 MIL/uL   Hemoglobin 8.3 (L) 12.0 - 15.0 g/dL   HCT 25.7 (L) 36 - 46 %   MCV 113.2 (H) 80.0 - 100.0 fL   MCH 36.6 (H) 26.0 - 34.0 pg   MCHC 32.3 30.0 - 36.0 g/dL   RDW 15.2 11.5 - 15.5 %   Platelets 549 (H) 150 - 400 K/uL   nRBC 0.0 0.0 - 0.2 %  Iron and TIBC     Status: Abnormal   Collection Time: 08/14/19  3:51 AM  Result Value Ref Range   Iron 22 (L) 28 - 170 ug/dL   TIBC 214 (L) 250 - 450 ug/dL   Saturation Ratios 10 (L) 10.4 - 31.8 %   UIBC 192 ug/dL  Vitamin B12     Status: None   Collection Time: 08/14/19  3:51 AM  Result  Value Ref Range   Vitamin B-12 823 180 - 914 pg/mL  Folate, serum, performed at [facility] lab     Status: Abnormal   Collection Time: 08/14/19  3:51 AM  Result Value Ref Range   Folate 4.8 (L) >5.9 ng/mL    No results found.  Assessment/Plan: Active Problems:   Hip fracture (HCC)  Left hip fracture 3 Days Post-Op s/p Procedure(s): INTRAMEDULLARY (IM) NAIL INTERTROCHANTRIC Weightbearing: WBAT LLE Insicional and dressing care: Reinforce dressings as needed, dressings changed today.  VTE prophylaxis: Lovenox 40mg  qd  Pain control: continue current regimen Follow - up plan: 1 week in office with Dr. Griffin Basil Dispo: Ready for d/c to SNF when bed available   Contact information:   Weekdays 8-5 Merlene Pulling, PA-C [phone] A fter hours and holidays please check [email or web address] for group call information for [facility]  Ventura Bruns 08/14/2019, 12:06 PM

## 2019-08-14 NOTE — Plan of Care (Signed)

## 2019-08-15 DIAGNOSIS — C3490 Malignant neoplasm of unspecified part of unspecified bronchus or lung: Secondary | ICD-10-CM | POA: Diagnosis not present

## 2019-08-15 DIAGNOSIS — R000 Tachycardia, unspecified: Secondary | ICD-10-CM | POA: Diagnosis not present

## 2019-08-15 DIAGNOSIS — R278 Other lack of coordination: Secondary | ICD-10-CM | POA: Diagnosis not present

## 2019-08-15 DIAGNOSIS — I5033 Acute on chronic diastolic (congestive) heart failure: Secondary | ICD-10-CM | POA: Diagnosis not present

## 2019-08-15 DIAGNOSIS — R269 Unspecified abnormalities of gait and mobility: Secondary | ICD-10-CM | POA: Diagnosis not present

## 2019-08-15 DIAGNOSIS — D473 Essential (hemorrhagic) thrombocythemia: Secondary | ICD-10-CM | POA: Diagnosis not present

## 2019-08-15 DIAGNOSIS — R069 Unspecified abnormalities of breathing: Secondary | ICD-10-CM | POA: Diagnosis not present

## 2019-08-15 DIAGNOSIS — R0602 Shortness of breath: Secondary | ICD-10-CM | POA: Diagnosis not present

## 2019-08-15 DIAGNOSIS — M6281 Muscle weakness (generalized): Secondary | ICD-10-CM | POA: Diagnosis not present

## 2019-08-15 DIAGNOSIS — C4490 Unspecified malignant neoplasm of skin, unspecified: Secondary | ICD-10-CM | POA: Diagnosis not present

## 2019-08-15 DIAGNOSIS — S72142D Displaced intertrochanteric fracture of left femur, subsequent encounter for closed fracture with routine healing: Secondary | ICD-10-CM | POA: Diagnosis not present

## 2019-08-15 DIAGNOSIS — S72145A Nondisplaced intertrochanteric fracture of left femur, initial encounter for closed fracture: Secondary | ICD-10-CM | POA: Diagnosis not present

## 2019-08-15 DIAGNOSIS — S72009A Fracture of unspecified part of neck of unspecified femur, initial encounter for closed fracture: Secondary | ICD-10-CM | POA: Diagnosis not present

## 2019-08-15 DIAGNOSIS — Z9981 Dependence on supplemental oxygen: Secondary | ICD-10-CM | POA: Diagnosis not present

## 2019-08-15 DIAGNOSIS — Z7951 Long term (current) use of inhaled steroids: Secondary | ICD-10-CM | POA: Diagnosis not present

## 2019-08-15 DIAGNOSIS — U071 COVID-19: Secondary | ICD-10-CM | POA: Diagnosis not present

## 2019-08-15 DIAGNOSIS — J449 Chronic obstructive pulmonary disease, unspecified: Secondary | ICD-10-CM | POA: Diagnosis not present

## 2019-08-15 DIAGNOSIS — M80052A Age-related osteoporosis with current pathological fracture, left femur, initial encounter for fracture: Secondary | ICD-10-CM | POA: Diagnosis not present

## 2019-08-15 DIAGNOSIS — I5021 Acute systolic (congestive) heart failure: Secondary | ICD-10-CM | POA: Diagnosis not present

## 2019-08-15 DIAGNOSIS — M810 Age-related osteoporosis without current pathological fracture: Secondary | ICD-10-CM | POA: Diagnosis not present

## 2019-08-15 DIAGNOSIS — M2550 Pain in unspecified joint: Secondary | ICD-10-CM | POA: Diagnosis not present

## 2019-08-15 DIAGNOSIS — Z923 Personal history of irradiation: Secondary | ICD-10-CM | POA: Diagnosis not present

## 2019-08-15 DIAGNOSIS — F329 Major depressive disorder, single episode, unspecified: Secondary | ICD-10-CM | POA: Diagnosis not present

## 2019-08-15 DIAGNOSIS — E538 Deficiency of other specified B group vitamins: Secondary | ICD-10-CM | POA: Diagnosis not present

## 2019-08-15 DIAGNOSIS — Z681 Body mass index (BMI) 19 or less, adult: Secondary | ICD-10-CM | POA: Diagnosis not present

## 2019-08-15 DIAGNOSIS — Z79890 Hormone replacement therapy (postmenopausal): Secondary | ICD-10-CM | POA: Diagnosis not present

## 2019-08-15 DIAGNOSIS — Z85118 Personal history of other malignant neoplasm of bronchus and lung: Secondary | ICD-10-CM | POA: Diagnosis not present

## 2019-08-15 DIAGNOSIS — C3432 Malignant neoplasm of lower lobe, left bronchus or lung: Secondary | ICD-10-CM | POA: Diagnosis not present

## 2019-08-15 DIAGNOSIS — I2510 Atherosclerotic heart disease of native coronary artery without angina pectoris: Secondary | ICD-10-CM | POA: Diagnosis not present

## 2019-08-15 DIAGNOSIS — I5043 Acute on chronic combined systolic (congestive) and diastolic (congestive) heart failure: Secondary | ICD-10-CM | POA: Diagnosis not present

## 2019-08-15 DIAGNOSIS — Z03818 Encounter for observation for suspected exposure to other biological agents ruled out: Secondary | ICD-10-CM | POA: Diagnosis not present

## 2019-08-15 DIAGNOSIS — R2689 Other abnormalities of gait and mobility: Secondary | ICD-10-CM | POA: Diagnosis not present

## 2019-08-15 DIAGNOSIS — M79605 Pain in left leg: Secondary | ICD-10-CM | POA: Diagnosis not present

## 2019-08-15 DIAGNOSIS — J811 Chronic pulmonary edema: Secondary | ICD-10-CM | POA: Diagnosis not present

## 2019-08-15 DIAGNOSIS — Z7982 Long term (current) use of aspirin: Secondary | ICD-10-CM | POA: Diagnosis not present

## 2019-08-15 DIAGNOSIS — Z96642 Presence of left artificial hip joint: Secondary | ICD-10-CM | POA: Diagnosis not present

## 2019-08-15 DIAGNOSIS — M7989 Other specified soft tissue disorders: Secondary | ICD-10-CM | POA: Diagnosis not present

## 2019-08-15 DIAGNOSIS — S7292XD Unspecified fracture of left femur, subsequent encounter for closed fracture with routine healing: Secondary | ICD-10-CM | POA: Diagnosis not present

## 2019-08-15 DIAGNOSIS — J9611 Chronic respiratory failure with hypoxia: Secondary | ICD-10-CM | POA: Diagnosis not present

## 2019-08-15 DIAGNOSIS — E039 Hypothyroidism, unspecified: Secondary | ICD-10-CM | POA: Diagnosis not present

## 2019-08-15 DIAGNOSIS — J9621 Acute and chronic respiratory failure with hypoxia: Secondary | ICD-10-CM | POA: Diagnosis not present

## 2019-08-15 DIAGNOSIS — R0902 Hypoxemia: Secondary | ICD-10-CM | POA: Diagnosis not present

## 2019-08-15 DIAGNOSIS — Z87891 Personal history of nicotine dependence: Secondary | ICD-10-CM | POA: Diagnosis not present

## 2019-08-15 DIAGNOSIS — Z85828 Personal history of other malignant neoplasm of skin: Secondary | ICD-10-CM | POA: Diagnosis not present

## 2019-08-15 DIAGNOSIS — R636 Underweight: Secondary | ICD-10-CM | POA: Diagnosis present

## 2019-08-15 DIAGNOSIS — J9811 Atelectasis: Secondary | ICD-10-CM | POA: Diagnosis not present

## 2019-08-15 DIAGNOSIS — D62 Acute posthemorrhagic anemia: Secondary | ICD-10-CM | POA: Diagnosis not present

## 2019-08-15 DIAGNOSIS — S72002A Fracture of unspecified part of neck of left femur, initial encounter for closed fracture: Secondary | ICD-10-CM | POA: Diagnosis not present

## 2019-08-15 DIAGNOSIS — J9601 Acute respiratory failure with hypoxia: Secondary | ICD-10-CM | POA: Diagnosis not present

## 2019-08-15 DIAGNOSIS — J90 Pleural effusion, not elsewhere classified: Secondary | ICD-10-CM | POA: Diagnosis not present

## 2019-08-15 DIAGNOSIS — D519 Vitamin B12 deficiency anemia, unspecified: Secondary | ICD-10-CM | POA: Diagnosis not present

## 2019-08-15 DIAGNOSIS — Z7401 Bed confinement status: Secondary | ICD-10-CM | POA: Diagnosis not present

## 2019-08-15 DIAGNOSIS — I10 Essential (primary) hypertension: Secondary | ICD-10-CM | POA: Diagnosis not present

## 2019-08-15 DIAGNOSIS — Z66 Do not resuscitate: Secondary | ICD-10-CM | POA: Diagnosis not present

## 2019-08-15 DIAGNOSIS — I509 Heart failure, unspecified: Secondary | ICD-10-CM | POA: Diagnosis not present

## 2019-08-15 DIAGNOSIS — I110 Hypertensive heart disease with heart failure: Secondary | ICD-10-CM | POA: Diagnosis not present

## 2019-08-15 DIAGNOSIS — I700 Atherosclerosis of aorta: Secondary | ICD-10-CM | POA: Diagnosis not present

## 2019-08-15 DIAGNOSIS — S79929A Unspecified injury of unspecified thigh, initial encounter: Secondary | ICD-10-CM | POA: Diagnosis not present

## 2019-08-15 DIAGNOSIS — Z79899 Other long term (current) drug therapy: Secondary | ICD-10-CM | POA: Diagnosis not present

## 2019-08-15 DIAGNOSIS — E441 Mild protein-calorie malnutrition: Secondary | ICD-10-CM | POA: Diagnosis not present

## 2019-08-15 LAB — BASIC METABOLIC PANEL
Anion gap: 8 (ref 5–15)
BUN: 24 mg/dL — ABNORMAL HIGH (ref 8–23)
CO2: 26 mmol/L (ref 22–32)
Calcium: 8.3 mg/dL — ABNORMAL LOW (ref 8.9–10.3)
Chloride: 102 mmol/L (ref 98–111)
Creatinine, Ser: 0.61 mg/dL (ref 0.44–1.00)
GFR calc Af Amer: >60 mL/min (ref >60)
GFR calc non Af Amer: >60 mL/min (ref >60)
Glucose, Bld: 111 mg/dL — ABNORMAL HIGH (ref 70–99)
Potassium: 4.4 mmol/L (ref 3.5–5.1)
Sodium: 136 mmol/L (ref 135–145)

## 2019-08-15 LAB — CBC
HCT: 24.1 % — ABNORMAL LOW (ref 36.0–46.0)
Hemoglobin: 7.8 g/dL — ABNORMAL LOW (ref 12.0–15.0)
MCH: 36.8 pg — ABNORMAL HIGH (ref 26.0–34.0)
MCHC: 32.4 g/dL (ref 30.0–36.0)
MCV: 113.7 fL — ABNORMAL HIGH (ref 80.0–100.0)
Platelets: 522 10*3/uL — ABNORMAL HIGH (ref 150–400)
RBC: 2.12 MIL/uL — ABNORMAL LOW (ref 3.87–5.11)
RDW: 15.3 % (ref 11.5–15.5)
WBC: 7.3 10*3/uL (ref 4.0–10.5)
nRBC: 0 % (ref 0.0–0.2)

## 2019-08-15 LAB — ABO/RH: ABO/RH(D): AB POS

## 2019-08-15 LAB — SARS CORONAVIRUS 2 (TAT 6-24 HRS): SARS Coronavirus 2: NEGATIVE

## 2019-08-15 LAB — PREPARE RBC (CROSSMATCH)

## 2019-08-15 MED ORDER — POLYETHYLENE GLYCOL 3350 17 G PO PACK: 17.0000 g | PACK | Freq: Every day | ORAL | 0 refills | Status: DC

## 2019-08-15 MED ORDER — DOCUSATE SODIUM 100 MG PO CAPS: 100.0000 mg | ORAL_CAPSULE | Freq: Two times a day (BID) | ORAL | 0 refills | Status: AC

## 2019-08-15 MED ORDER — FERROUS SULFATE 325 (65 FE) MG PO TABS: 325.0000 mg | ORAL_TABLET | Freq: Every day | ORAL | 0 refills | Status: DC

## 2019-08-15 MED ORDER — SODIUM CHLORIDE 0.9% IV SOLUTION: Freq: Once | INTRAVENOUS | Status: DC

## 2019-08-15 MED ORDER — FOLIC ACID 1 MG PO TABS: 1.0000 mg | ORAL_TABLET | Freq: Every day | ORAL | 0 refills | Status: AC

## 2019-08-15 MED ORDER — CYANOCOBALAMIN 1000 MCG/ML IJ SOLN
1000.0000 ug | Freq: Once | INTRAMUSCULAR | Status: AC
  Administered 2019-08-15: 1000 ug via INTRAMUSCULAR
  Filled 2019-08-15: qty 1

## 2019-08-15 NOTE — Progress Notes (Signed)
Attempted to call report to [facility]. Receptionist requested number for call back.

## 2019-08-15 NOTE — Progress Notes (Addendum)
Physical Therapy Treatment Patient Details Name: Kathy Pierce MRN: [id number] DOB: Mar 03, 1925 Today's Date: 08/15/2019    History of Present Illness 86 yo female admitted after fall resulting in complex L hip fracture. S/p IM L LE. Marland Kitchen Hx of COPD, lung ca, depression, osteoporosis, squamous cell cancer, spinal stenosis, non healing L leg wound    PT Comments    Pt  Is steadily progressing. incr amb distance today, SpO2=100% on 2L O2.  Continue to recommend SNF to maximize independence.    Follow Up Recommendations  SNF     Equipment Recommendations  None recommended by PT    Recommendations for Other Services       Precautions / Restrictions Precautions Precautions: Fall Precaution Comments: monitor Sats, O2 dependent at baseline Restrictions Weight Bearing Restrictions: No LLE Weight Bearing: Weight bearing as tolerated Other Position/Activity Restrictions: WBAT    Mobility  Bed Mobility               General bed mobility comments: Pt up in chair and requests back to same   Transfers Overall transfer level: Needs assistance Equipment used: Rolling walker (2 wheeled) Transfers: Sit to/from Stand Sit to Stand: Min assist         General transfer comment: cues for LE management and use of UEs to self assist;  Physical assist to bring wt up and fwd  Ambulation/Gait Ambulation/Gait assistance: Min assist;Min guard Gait Distance (Feet): 40 Feet Assistive device: Rolling walker (2 wheeled) Gait Pattern/deviations: Step-to pattern;Decreased stance time - left;Decreased step length - left     General Gait Details: multi-modal cues for sequence, RW position and step length, fatigued after distance above requiring seated rest   Stairs             Wheelchair Mobility    Modified Rankin (Stroke Patients Only)       Balance                                            Cognition Arousal/Alertness: Awake/alert Behavior During  Therapy: WFL for tasks assessed/performed Overall Cognitive Status: Within Functional Limits for tasks assessed                                        Exercises  ankle pumps x 10, quad sets x 10, heel slides x 10, hip abd/add x 10    General Comments        Pertinent Vitals/Pain Pain Assessment: 0-10 Pain Score: 2  Pain Location: L LE Pain Descriptors / Indicators: Discomfort Pain Intervention(s): Limited activity within patient's tolerance;Monitored during session    Home Living                      Prior Function            PT Goals (current goals can now be found in the care plan section) Acute Rehab PT Goals Patient Stated Goal: "if I go to rehab I would like [facility]" PT Goal Formulation: With patient Time For Goal Achievement: 08/26/19 Potential to Achieve Goals: Good Progress towards PT goals: Progressing toward goals    Frequency    Min 3X/week      PT Plan Current plan remains appropriate    Co-evaluation  AM-PAC PT "6 Clicks" Mobility   Outcome Measure  Help needed turning from your back to your side while in a flat bed without using bedrails?: A Little Help needed moving from lying on your back to sitting on the side of a flat bed without using bedrails?: A Little Help needed moving to and from a bed to a chair (including a wheelchair)?: A Little Help needed standing up from a chair using your arms (e.g., wheelchair or bedside chair)?: A Little Help needed to walk in hospital room?: A Little Help needed climbing 3-5 steps with a railing? : A Lot 6 Click Score: 17    End of Session Equipment Utilized During Treatment: Gait belt Activity Tolerance: Patient tolerated treatment well Patient left: with call bell/phone within reach;in chair Nurse Communication: Mobility status PT Visit Diagnosis: Unsteadiness on feet (R26.81);Muscle weakness (generalized) (M62.81)     Time: 3254-9826 PT Time  Calculation (min) (ACUTE ONLY): 20 min  Charges:  $Gait Training: 8-22 mins                     Baxter Flattery, PT  Acute Rehab Dept ([place]) [phone] Pager [phone]  08/15/2019    [facility] [place] 08/15/2019, 12:44 PM

## 2019-08-15 NOTE — Discharge Summary (Signed)
[facility] Discharge Summary   Patient: Kathy Pierce  PCP: London Pepper, MD  Date of admission: 08/11/2019   Date of discharge:  08/15/2019     Discharge Diagnoses:  Principal diagnosis Left intertrochanteric hip fracture SP intramedullary nail  Active Problems:   Hip fracture ([place]) Postoperative acute blood loss anemia. History of lung cancer.  Admitted From: Home Disposition:  SNF   Recommendations for Outpatient Follow-up:  1. PCP: Follow-up with PCP in 1 week. 2. Follow-up with orthopedic as recommended 3. Follow up LABS/TEST: CBC in 1 week   Follow-up Information    Hiram Gash, MD. Schedule an appointment as soon as possible for a visit in 1 week.   Specialty: Orthopedic Surgery Why: For wound re-check Contact information: [address] [place] [address] [phone]        London Pepper, MD. Schedule an appointment as soon as possible for a visit in 1 week(s).   Specialty: Family Medicine Contact information: [address] [place] [address] [phone]              Diet recommendation: Cardiac diet  Activity: The patient is advised to gradually reintroduce usual activities, as tolerated  Discharge Condition: stable  Code Status: DNR   History of present illness: As per the H and P dictated on admission, "Kathy Pierce is a 86 y.o. female with Past medical history of recent admission for HFpEF, COPD, HTN, prior history of lung cancer, left leg squamous cell carcinoma, depression, osteoporosis, B12 deficiency, underweight, hypothyroidism. Patient presents with a mechanical fall.  She was trying to take out her trash can and walking on the driveway.  She lost her footing and fell on the.  Denies any head injury or neck injury or passing out episodes. Denies any headache chest pain or shortness of breath prior to this episode. Patient was recently hospitalized in the end of June for CHF  exacerbation and mentions that her breathing has been stable.  She is able to walk around the house without any shortness of breath.  No nausea no vomiting no fever no chills.  No diarrhea. At the time of my evaluation no tingling no numbness no focal deficit reported by the patient.  No fever no chills.  No recent change in medication at the recent hospitalization.  ED Course: Presents with a mechanical fall.  CT head and CT C-spine unremarkable.  X-ray hip shows left hip fracture.  Orthopedic were consulted.  Initial plan was to transfer the patient to [facility].  Later on it was decided that the patient will get surgery here at [facility] long.  Hospitalist were consulted for admission."  Hospital Course:  Summary of her active problems in the hospital is as following. 1.Left intertrochanteric hip fracture. After mechanical fall. Orthopedic consultedscheduled for surgery on 08/11/2019. Pain control per orthopedic. from medical perspective with low risk. Her primary risk is actually her age only. PT OT and DVT prophylaxis per orthopedic as well.  2.Mechanical fall CT head and C-spine unremarkable. No acute complaint. Monitor. PT OT consultation postop.  3.Preoperative medical evaluation Low risk. Tolerated surgery very well.  4.  Postoperative acute blood loss anemia. Chronic microcytosis from hydroxyurea. Iron deficiency Hemoglobin dropped from 12-9.9, continues to drop.  Now stable Suspect that the hemoglobin of 7.8 is just within the expected range for letter. Will provide 1 PRBC transfusion prior to discharge. Iron level low.    Patient was given  1 dose of IV Feraheme. Also folic acid and C94.  5.  Chronic HFpEF. Chronic hypoxic respiratory failure. History of COPD Essential hypertension Continue inhalers. Lasix resume.  Monitor. Continue home oxygen.  6.  Left lower lobe cancer SP radiation 2018. On observation. On hydroxyurea. Monitor while  patient being monitored for anemia.  7.  Depression. Currently stable. Continue Zoloft.  Pain control  - [employer] Controlled Substance Reporting System database was reviewed. - 5 day supply was provided. - Patient was instructed, not to drive, operate heavy machinery, perform activities at heights, swimming or participation in water activities or provide baby sitting services while on Pain, Sleep and Anxiety Medications; until her outpatient Physician has advised to do so again.  - Also recommended to not to take more than prescribed Pain, Sleep and Anxiety Medications.  Patient was seen by physical therapy, who recommended SNF, which was arranged. On the day of the discharge the patient's vitals were stable, and no other acute medical condition were reported by patient. the patient was felt safe to be discharge at SNF with therapy.  Consultants: Orthopedics Procedures: Intramedullary nail implant 08/11/2019. PRBC transfusion 08/15/2019.  Discharge Exam: General: Appear in mild distress, no Rash; Oral Mucosa Clear, moist. Cardiovascular: S1 and S2 Present, no Murmur, Respiratory: normal respiratory effort, Bilateral Air entry present and no Crackles, no wheezes Abdomen: Bowel Sound present, Soft and no tenderness, no hernia Extremities: no Pedal edema, no calf tenderness Neurology: alert and oriented to time, place, and person affect appropriate.  Filed Weights   08/11/19 2100  Weight: 49 kg   Vitals:   08/15/19 0905 08/15/19 1137  BP: 108/67 (P) 118/73  Pulse: 94 (!) (P) 106  Resp:  (P) 14  Temp:  (P) 97.9 F (36.6 C)  SpO2:  (P) 95%    DISCHARGE MEDICATION: Allergies as of 08/15/2019      Reactions   Penicillins Hives, Rash   Did it involve swelling of the face/tongue/throat, SOB, or low BP? N Did it involve sudden or severe rash/hives, skin peeling, or any reaction on the inside of your mouth or nose? Y Did you need to seek medical attention at a hospital or  doctor's office? N When did it last happen?Over 83 Years Ago If all above answers are "NO", may proceed with cephalosporin use.   Codeine Nausea And Vomiting   Erythromycin Nausea And Vomiting   Sulfa Drugs Cross Reactors Nausea And Vomiting      Medication List    STOP taking these medications   traMADol 50 MG tablet Commonly known as: ULTRAM     TAKE these medications   acetaminophen 500 MG tablet Commonly known as: TYLENOL Take 2 tablets (1,000 mg total) by mouth every 8 (eight) hours for 14 days.   albuterol 108 (90 Base) MCG/ACT inhaler Commonly known as: VENTOLIN HFA Inhale 2 puffs into the lungs every 6 (six) hours as needed for wheezing or shortness of breath.   Anoro Ellipta 62.5-25 MCG/INH Aepb Generic drug: umeclidinium-vilanterol Inhale 1 puff into the lungs daily.   ARTIFICIAL TEARS OP Place 1 drop into both eyes as needed (for dry eyes).   aspirin 81 MG tablet Take 81 mg by mouth daily.   atenolol 25 MG tablet Commonly known as: TENORMIN Take 25 mg by mouth daily.   benzonatate 100 MG capsule Commonly known as: TESSALON Take 100 mg by mouth 3 (three) times daily as needed for cough.   calcium carbonate 1250 (500 Ca) MG tablet Commonly  known as: OS-CAL - dosed in mg of elemental calcium Take 1 tablet by mouth every evening.   cyanocobalamin 1000 MCG/ML injection Commonly known as: (VITAMIN B-12) Inject 1,000 mcg into the muscle every 30 (thirty) days.   docusate sodium 100 MG capsule Commonly known as: COLACE Take 1 capsule (100 mg total) by mouth 2 (two) times daily.   enoxaparin 40 MG/0.4ML injection Commonly known as: LOVENOX Inject 0.4 mLs (40 mg total) into the skin daily. For DVT prophylaxis after surgery   ferrous sulfate 325 (65 FE) MG tablet Take 1 tablet (325 mg total) by mouth daily with breakfast. Start taking on: August 16, 6158   folic acid 1 MG tablet Commonly known as: FOLVITE Take 1 tablet (1 mg total) by mouth  daily. Start taking on: August 16, 2019   furosemide 20 MG tablet Commonly known as: LASIX Take 1 tablet (20 mg total) by mouth every other day.   hydroxyurea 500 MG capsule Commonly known as: HYDREA Take 500-1,000 mg by mouth every other day. May take with food to minimize GI side effects. Alternate Take 1 capsule (500 mg) and Take 2 capsules (1000 mg) Other Day   levothyroxine 25 MCG tablet Commonly known as: SYNTHROID Take 25 mcg by mouth daily.   meclizine 25 MG tablet Commonly known as: ANTIVERT Take 25 mg by mouth 3 (three) times daily as needed for dizziness.   omeprazole 20 MG capsule Commonly known as: PRILOSEC Take 20 mg by mouth every morning.   ondansetron 4 MG tablet Commonly known as: Zofran Take 1 tablet (4 mg total) by mouth every 8 (eight) hours as needed for up to 7 days for nausea or vomiting.   oxyCODONE 5 MG immediate release tablet Commonly known as: Oxy IR/ROXICODONE Take 1/2 to 1 pill every 6 hrs as needed for pain   polyethylene glycol 17 g packet Commonly known as: MIRALAX / GLYCOLAX Take 17 g by mouth daily. Start taking on: August 16, 2019   PRESERVISION AREDS PO Take 1 tablet by mouth daily.   sertraline 100 MG tablet Commonly known as: ZOLOFT Take 50 mg by mouth daily.            Discharge Care Instructions  (From admission, onward)         Start     Ordered   08/15/19 0000  Leave dressing on - Keep it clean, dry, and intact until clinic visit        08/15/19 1137         Allergies  Allergen Reactions  . Penicillins Hives and Rash    Did it involve swelling of the face/tongue/throat, SOB, or low BP? N Did it involve sudden or severe rash/hives, skin peeling, or any reaction on the inside of your mouth or nose? Y Did you need to seek medical attention at a hospital or doctor's office? N When did it last happen?Over 69 Years Ago If all above answers are "NO", may proceed with cephalosporin use.    . Codeine Nausea And  Vomiting  . Erythromycin Nausea And Vomiting  . Sulfa Drugs Cross Reactors Nausea And Vomiting   Discharge Instructions    Diet - low sodium heart healthy   Complete by: As directed    Increase activity slowly   Complete by: As directed    Leave dressing on - Keep it clean, dry, and intact until clinic visit   Complete by: As directed       The results of significant  diagnostics from this hospitalization (including imaging, microbiology, ancillary and laboratory) are listed below for reference.    Significant Diagnostic Studies: DG Chest 1 View  Result Date: 08/11/2019 CLINICAL DATA:  Pain following fall EXAM: CHEST  1 VIEW COMPARISON:  July 26, 2019 chest radiograph and July 27, 2019 chest CT FINDINGS: There is underlying emphysematous change. There is scarring in the left mid lung region with nearby fiducial clips, indicative of post radiation therapy fibrosis. No appreciable edema or airspace opacity. Heart is slightly enlarged with pulmonary vascularity reflective of underlying emphysematous change. No adenopathy. No bone lesions. IMPRESSION: Scarring with post radiation therapy fibrosis on the left, stable. Underlying emphysematous changes throughout the lungs, better delineated on recent CT. No frank edema or airspace opacity. Stable cardiac prominence. No adenopathy evident by radiography. Electronically Signed   By: Lowella Grip III M.D.   On: 08/11/2019 11:21   DG Chest 2 View  Result Date: 07/26/2019 CLINICAL DATA:  Cough, increasing shortness of breath for 2 days, COPD EXAM: CHEST - 2 VIEW COMPARISON:  04/22/2019 FINDINGS: Frontal and lateral views of the chest demonstrate a stable cardiac silhouette. Chronic scarring is seen along the left major fissure. There is extensive background scarring and hyperinflation compatible with emphysema. Since the prior exam, central vascular congestion and diffuse interstitial prominence have developed, consistent with pulmonary edema. No  effusion or pneumothorax. No acute bony abnormalities. IMPRESSION: 1. Interstitial edema superimposed upon background emphysema. Electronically Signed   By: Randa Ngo M.D.   On: 07/26/2019 20:51   CT Head Wo Contrast  Result Date: 08/11/2019 CLINICAL DATA:  Fall EXAM: CT HEAD WITHOUT CONTRAST TECHNIQUE: Contiguous axial images were obtained from the base of the skull through the vertex without intravenous contrast. COMPARISON:  None. FINDINGS: Brain: There is no acute intracranial hemorrhage, mass effect, or edema. There is no acute appearing loss of gray-white differentiation. Chronic left parietal infarct is present. Patchy and confluent areas of hypoattenuation in the supratentorial white matter are nonspecific but probably reflect moderate chronic microvascular ischemic changes. There is no extra-axial fluid collection. Prominence of the ventricles and sulci reflects mild generalized parenchymal volume loss. Vascular: There is atherosclerotic calcification at the skull base. Skull: Calvarium is unremarkable. Sinuses/Orbits: No acute finding. Other: None. IMPRESSION: No evidence of acute intracranial injury. Chronic/nonemergent findings detailed above. Electronically Signed   By: Macy Mis M.D.   On: 08/11/2019 13:34   CT ANGIO CHEST PE W OR WO CONTRAST  Result Date: 07/27/2019 CLINICAL DATA:  Shortness of breath EXAM: CT ANGIOGRAPHY CHEST WITH CONTRAST TECHNIQUE: Multidetector CT imaging of the chest was performed using the standard protocol during bolus administration of intravenous contrast. Multiplanar CT image reconstructions and MIPs were obtained to evaluate the vascular anatomy. CONTRAST:  70mL OMNIPAQUE IOHEXOL 350 MG/ML SOLN COMPARISON:  11/19/2018 FINDINGS: Cardiovascular: Satisfactory opacification of the pulmonary arteries to the segmental level. No evidence of pulmonary embolism when allowing for levels of motion artifact. Enlarged heart size. No pericardial effusion. Aortic and  coronary atherosclerosis Mediastinum/Nodes: Negative for adenopathy or mass. Lungs/Pleura: Radiation fibrosis around fiducial clips in the upper left lung. Centrilobular and panlobular emphysema. Mild dependent atelectasis and trace pleural fluid. No consolidation, interlobular septal thickening, or pneumothorax. Upper Abdomen: Layering high-density in the gallbladder which was also seen on prior, with homogeneous appearance suggesting milk of calcium or microlithiasis. No acute finding. Musculoskeletal: Remote left rib fractures which may be posttraumatic or related to radiation. Remote T6 compression fracture. No acute or aggressive finding Review of  the MIP images confirms the above findings. IMPRESSION: 1. No evidence of pulmonary embolism.  Motion is a limiting factor. 2. Irradiated left lung cancer without worrisome features. 3. Cardiomegaly and trace pleural effusions. 4. Advanced emphysema. Aortic Atherosclerosis (ICD10-I70.0) and Emphysema (ICD10-J43.9). Electronically Signed   By: Monte Fantasia M.D.   On: 07/27/2019 05:58   CT Cervical Spine Wo Contrast  Result Date: 08/11/2019 CLINICAL DATA:  Fall EXAM: CT CERVICAL SPINE WITHOUT CONTRAST TECHNIQUE: Multidetector CT imaging of the cervical spine was performed without intravenous contrast. Multiplanar CT image reconstructions were also generated. COMPARISON:  None. FINDINGS: Alignment: Anteroposterior alignment is maintained. Skull base and vertebrae: No acute cervical spine fracture. Vertebral body heights are preserved apart from degenerative endplate irregularity. No destructive osseous lesion. Soft tissues and spinal canal: No prevertebral fluid or swelling. No visible canal hematoma. Disc levels: Multilevel degenerative changes are present including disc space narrowing, endplate osteophytes, and facet and uncovertebral hypertrophy. Mild canal stenosis. Multilevel foraminal stenosis. Upper chest: Emphysema and scarring at the lung apices. Other:  Mild calcified plaque at the ICA origins. IMPRESSION: No acute cervical spine fracture. Electronically Signed   By: Macy Mis M.D.   On: 08/11/2019 13:38   DG C-Arm 1-60 Min-No Report  Result Date: 08/11/2019 Fluoroscopy was utilized by the requesting physician.  No radiographic interpretation.   ECHOCARDIOGRAM COMPLETE  Result Date: 07/27/2019    ECHOCARDIOGRAM REPORT   Patient Name:   Kathy Pierce [facility] Date of Exam: 07/27/2019 Medical Rec #:  [id number]     Height:       65.0 in Accession #:    [id number]    Weight:       108.0 lb Date of Birth:  1925/07/10    BSA:          1.522 m Patient Age:    4 years      BP:           106/63 mmHg Patient Gender: F             HR:           84 bpm. Exam Location:  Inpatient Procedure: 2D Echo Indications:    Congestive Heart Failure I50.9  History:        Patient has prior history of Echocardiogram examinations, most                 recent 06/16/2016. COPD; Risk Factors:Hypertension.  Sonographer:    Mikki Santee RDCS (AE) Referring Phys: [id number] [place]  1. Left ventricular ejection fraction, by estimation, is 60 to 65%. The left ventricle has normal function. The left ventricle has no regional wall motion abnormalities. There is mild concentric left ventricular hypertrophy. Left ventricular diastolic parameters are consistent with Grade II diastolic dysfunction (pseudonormalization). Elevated left ventricular end-diastolic pressure.  2. Right ventricular systolic function is normal. The right ventricular size is normal. There is moderately elevated pulmonary artery systolic pressure.  3. Left atrial size was severely dilated.  4. Right atrial size was mildly dilated.  5. The mitral valve is normal in structure. Trivial mitral valve regurgitation. No evidence of mitral stenosis.  6. The aortic valve is tricuspid. Aortic valve regurgitation is trivial. Mild aortic valve sclerosis is present, with no evidence of aortic valve stenosis.  7.  Aortic dilatation noted. There is mild dilatation of the ascending aorta measuring 38 mm.  8. The inferior vena cava is dilated in size with >50% respiratory variability, suggesting right atrial pressure of 8  mmHg. FINDINGS  Left Ventricle: Left ventricular ejection fraction, by estimation, is 60 to 65%. The left ventricle has normal function. The left ventricle has no regional wall motion abnormalities. The left ventricular internal cavity size was normal in size. There is  mild concentric left ventricular hypertrophy. Left ventricular diastolic parameters are consistent with Grade II diastolic dysfunction (pseudonormalization). Elevated left ventricular end-diastolic pressure. Right Ventricle: The right ventricular size is normal. No increase in right ventricular wall thickness. Right ventricular systolic function is normal. There is moderately elevated pulmonary artery systolic pressure. The tricuspid regurgitant velocity is 3.18 m/s, and with an assumed right atrial pressure of 8 mmHg, the estimated right ventricular systolic pressure is 25.9 mmHg. Left Atrium: Left atrial size was severely dilated. Right Atrium: Right atrial size was mildly dilated. Pericardium: There is no evidence of pericardial effusion. Mitral Valve: The mitral valve is normal in structure. Normal mobility of the mitral valve leaflets. Mild mitral annular calcification. Trivial mitral valve regurgitation. No evidence of mitral valve stenosis. Tricuspid Valve: The tricuspid valve is normal in structure. Tricuspid valve regurgitation is mild . No evidence of tricuspid stenosis. Aortic Valve: The aortic valve is tricuspid. . There is mild thickening and mild calcification of the aortic valve. Aortic valve regurgitation is trivial. Mild aortic valve sclerosis is present, with no evidence of aortic valve stenosis. There is mild thickening of the aortic valve. There is mild calcification of the aortic valve. Pulmonic Valve: The pulmonic valve was  normal in structure. Pulmonic valve regurgitation is not visualized. No evidence of pulmonic stenosis. Aorta: Aortic dilatation noted. There is mild dilatation of the ascending aorta measuring 38 mm. Venous: The inferior vena cava is dilated in size with greater than 50% respiratory variability, suggesting right atrial pressure of 8 mmHg. IAS/Shunts: No atrial level shunt detected by color flow Doppler.  LEFT VENTRICLE PLAX 2D LVIDd:         3.90 cm  Diastology LVIDs:         2.10 cm  LV e' lateral:   6.31 cm/s LV PW:         1.10 cm  LV E/e' lateral: 17.4 LV IVS:        1.20 cm  LV e' medial:    5.87 cm/s LVOT diam:     2.00 cm  LV E/e' medial:  18.7 LV SV:         69 LV SV Index:   45 LVOT Area:     3.14 cm  RIGHT VENTRICLE RV S prime:     12.40 cm/s TAPSE (M-mode): 1.1 cm LEFT ATRIUM             Index       RIGHT ATRIUM           Index LA diam:        3.60 cm 2.36 cm/m  RA Area:     18.20 cm LA Vol (A2C):   76.7 ml 50.38 ml/m RA Volume:   46.90 ml  30.81 ml/m LA Vol (A4C):   50.4 ml 33.11 ml/m LA Biplane Vol: 63.7 ml 41.84 ml/m  AORTIC VALVE LVOT Vmax:   88.60 cm/s LVOT Vmean:  68.600 cm/s LVOT VTI:    0.220 m  AORTA Ao Root diam: 3.20 cm MITRAL VALVE                TRICUSPID VALVE MV Area (PHT): 2.93 cm     TR Peak grad:   40.4 mmHg MV Decel Time:  259 msec     TR Vmax:        318.00 cm/s MV E velocity: 110.00 cm/s MV A velocity: 93.00 cm/s   SHUNTS MV E/A ratio:  1.18         Systemic VTI:  0.22 m                             Systemic Diam: 2.00 cm Skeet Latch MD Electronically signed by Skeet Latch MD Signature Date/Time: 07/27/2019/11:58:08 AM    Final    DG HIP OPERATIVE UNILAT W OR W/O PELVIS LEFT  Result Date: 08/11/2019 CLINICAL DATA:  Hip fracture EXAM: OPERATIVE left HIP (WITH PELVIS IF PERFORMED) 5 VIEWS TECHNIQUE: Fluoroscopic spot image(s) were submitted for interpretation post-operatively. COMPARISON:  Pelvis 08/11/2019 FINDINGS: Left intertrochanteric fracture has been fixed with  2 screws and a locking intramedullary rod extending to the distal femur. Satisfactory fracture alignment. Satisfactory hardware positioning. IMPRESSION: ORIF left intertrochanteric fracture. Electronically Signed   By: Franchot Gallo M.D.   On: 08/11/2019 20:57   DG Hip Unilat With Pelvis 2-3 Views Left  Result Date: 08/11/2019 CLINICAL DATA:  Left hip pain after a fall today. EXAM: DG HIP (WITH OR WITHOUT PELVIS) 2-3V LEFT COMPARISON:  None. FINDINGS: The patient has a comminuted intertrochanteric fracture with subtrochanteric extension of the left hip. The lesser trochanter is a separate fragment. The femoral head is located. No other acute abnormality. IMPRESSION: Comminuted left intertrochanteric fracture with subtrochanteric extension. Electronically Signed   By: Inge Rise M.D.   On: 08/11/2019 11:22   DG FEMUR PORT MIN 2 VIEWS LEFT  Result Date: 08/11/2019 CLINICAL DATA:  Postop intertrochanteric femur fracture repair. EXAM: LEFT FEMUR PORTABLE 2 VIEWS COMPARISON:  Preoperative hip radiograph earlier today. FINDINGS: Intramedullary nail with distal locking and trans trochanteric screw fixation of intertrochanteric femur fracture. Persistent displacement of the lesser trochanter but improved from preoperative imaging. Recent postsurgical change includes air and edema in the soft tissues. IMPRESSION: Post ORIF intertrochanteric left femur fracture without immediate postoperative complication. Electronically Signed   By: Keith Rake M.D.   On: 08/11/2019 20:43    Microbiology: Recent Results (from the past 240 hour(s))  SARS Coronavirus 2 by RT PCR (hospital order, performed in [facility] hospital lab) Nasopharyngeal Nasopharyngeal Swab     Status: None   Collection Time: 08/11/19 12:17 PM   Specimen: Nasopharyngeal Swab  Result Value Ref Range Status   SARS Coronavirus 2 NEGATIVE NEGATIVE Final    Comment: (NOTE) SARS-CoV-2 target nucleic acids are NOT DETECTED.  The SARS-CoV-2  RNA is generally detectable in upper and lower respiratory specimens during the acute phase of infection. The lowest concentration of SARS-CoV-2 viral copies this assay can detect is 250 copies / mL. A negative result does not preclude SARS-CoV-2 infection and should not be used as the sole basis for treatment or other patient management decisions.  A negative result may occur with improper specimen collection / handling, submission of specimen other than nasopharyngeal swab, presence of viral mutation(s) within the areas targeted by this assay, and inadequate number of viral copies (<250 copies / mL). A negative result must be combined with clinical observations, patient history, and epidemiological information.  Fact Sheet for Patients:   StrictlyIdeas.no  Fact Sheet for Healthcare Providers: BankingDealers.co.za  This test is not yet approved or  cleared by the [place] FDA and has been authorized for detection and/or diagnosis of SARS-CoV-2 by  FDA under an Emergency Use Authorization (EUA).  This EUA will remain in effect (meaning this test can be used) for the duration of the COVID-19 declaration under Section 564(b)(1) of the Act, 21 U.S.C. section 360bbb-3(b)(1), unless the authorization is terminated or revoked sooner.  Performed at [facility], [place] [address] [place], [place] [address]   SARS CORONAVIRUS 2 (TAT 6-24 HRS) Nasopharyngeal Nasopharyngeal Swab     Status: None   Collection Time: 08/14/19  3:38 PM   Specimen: Nasopharyngeal Swab  Result Value Ref Range Status   SARS Coronavirus 2 NEGATIVE NEGATIVE Final    Comment: (NOTE) SARS-CoV-2 target nucleic acids are NOT DETECTED.  The SARS-CoV-2 RNA is generally detectable in upper and lower respiratory specimens during the acute phase of infection. Negative results do not preclude SARS-CoV-2 infection, do not rule out co-infections with other  pathogens, and should not be used as the sole basis for treatment or other patient management decisions. Negative results must be combined with clinical observations, patient history, and epidemiological information. The expected result is Negative.  Fact Sheet for Patients: SugarRoll.be  Fact Sheet for Healthcare Providers: https://www.woods-mathews.com/  This test is not yet approved or cleared by the [place] FDA and  has been authorized for detection and/or diagnosis of SARS-CoV-2 by FDA under an Emergency Use Authorization (EUA). This EUA will remain  in effect (meaning this test can be used) for the duration of the COVID-19 declaration under Se ction 564(b)(1) of the Act, 21 U.S.C. section 360bbb-3(b)(1), unless the authorization is terminated or revoked sooner.  Performed at [facility], [place] [address], [place], [place] [address]      Labs: CBC: Recent Labs  Lab 08/11/19 1157 08/12/19 0326 08/13/19 0332 08/14/19 0351 08/15/19 0419  WBC 12.5* 8.9 7.4 8.5 7.3  NEUTROABS 10.6*  --   --   --   --   HGB 12.4 9.9* 8.3* 8.3* 7.8*  HCT 38.3 31.1* 25.7* 25.7* 24.1*  MCV 113.6* 115.2* 113.7* 113.2* 113.7*  PLT 649* 512* 479* 549* 585*   Basic Metabolic Panel: Recent Labs  Lab 08/11/19 1157 08/11/19 1157 08/12/19 0325 08/12/19 0326 08/13/19 0332 08/14/19 0351 08/15/19 0419  NA 136  --  135  --  132* 134* 136  K 4.0  --  4.3  --  4.3 4.4 4.4  CL 97*  --  101  --  99 101 102  CO2 27  --  25  --  25 26 26   GLUCOSE 137*  --  124*  --  114* 125* 111*  BUN 23  --  24*  --  31* 25* 24*  CREATININE 0.79   < > 0.77 0.67 0.94 0.97 0.61  CALCIUM 9.0  --  8.5*  --  8.4* 8.4* 8.3*  MG  --   --  1.9  --   --   --   --    < > = values in this interval not displayed.   Liver Function Tests: No results for input(s): AST, ALT, ALKPHOS, BILITOT, PROT, ALBUMIN in the last 168 hours. No results for input(s): LIPASE, AMYLASE in  the last 168 hours. No results for input(s): AMMONIA in the last 168 hours. Cardiac Enzymes: No results for input(s): CKTOTAL, CKMB, CKMBINDEX, TROPONINI in the last 168 hours. BNP (last 3 results) Recent Labs    07/26/19 2027  BNP 350.8*   CBG: No results for input(s): GLUCAP in the last 168 hours.  Time spent: 21  minutes  Signed:  Berle Mull  [facility]  08/15/2019 11:37 AM

## 2019-08-15 NOTE — TOC Transition Note (Signed)
Transition of Care [facility]) - CM/SW Discharge Note   Patient Details  Name: Kathy Pierce MRN: [id number] Date of Birth: 1925/10/06  Transition of Care [facility]) CM/SW Contact:  Lennart Pall, LCSW Phone Number: 08/15/2019, 1:27 PM   Clinical Narrative:  Have received insurance authorization and medical clearance for pt to transfer today to [facility]. Pt and granddaughter aware and in agreement.  Await completion of transfusion and with then plan PTAR pick up for transport.  NO further TOC needs noted.     Final next level of care: Skilled Nursing Facility Barriers to Discharge: Barriers Resolved   Patient Goals and CMS Choice Patient states their goals for this hospitalization and ongoing recovery are:: go to short term rehab and then home      Discharge Placement              Patient chooses bed at: [facility] Patient to be transferred to facility by: [facility] Name of family member notified: granddaughter, Sharyn Lull Patient and family notified of of transfer: 08/15/19  Discharge Plan and Services     Post Acute Care Choice: [facility]                               Social Determinants of Health (SDOH) Interventions     Readmission Risk Interventions Readmission Risk Prevention Plan 07/29/2019  Transportation Screening Complete  Home Care Screening Complete  Medication Review (RN CM) Complete  Some recent data might be hidden

## 2019-08-15 NOTE — Discharge Instructions (Signed)
  Ophelia Charter MD, MPH Noemi Chapel, PA-C [place] [address] [phone] (tel)   [phone] (fax)   [place] - Please keep dressing intact until followup.  - You may shower on Post-Op Day #2.  - The dressing is water resistant but do not scrub it as it may start to peel up.   - If dressings have been on for over a week, please remove - Keep steri-strips in place - If steri-strips fall off with bandage removal, please place new steri strips over the incisions - Keep steri-strips on until they fall off on their own. - Gently pat the area dry.  - Do not soak the shoulder in water. Do not go swimming in the pool or ocean until your sutures are removed. - KEEP THE INCISIONS CLEAN AND DRY.  EXERCISES - Follow the instructions of your therapist.  No specific exercises necessary outside of this. - You may bear weight on your operative leg. - Please continue to ambulate and do not stay sitting or lying for too long. Perform foot and wrist pumps to assist in circulation.  POST-OP MEDICATIONS- Multimodal approach to pain control . In general your pain will be controlled with a combination of substances.  Prescriptions unless otherwise discussed are electronically sent to your pharmacy.  This is a carefully made plan we use to minimize narcotic use.     ? Acetaminophen - Non-narcotic pain medicine taken on a scheduled basis  ? Oxycodone - This is a strong narcotic, to be used only on an "as needed" basis for pain. ? Lovenox - This medicine is used to minimize the risk of blood clots after surgery.  FOLLOW-UP - If you develop a Fever (>101.5), Redness or Drainage from the surgical incision site, please call our office to arrange for an evaluation. - Please call the office to schedule a follow-up appointment for your incision check, 10-14 days post-operatively.  IF YOU HAVE ANY QUESTIONS, PLEASE FEEL FREE TO CALL  OUR OFFICE.  HELPFUL INFORMATION  You should wean off your narcotic medicines as soon as you are able.  Most patients will be off or using minimal narcotics before their first postop appointment.   We suggest you use the pain medication the first night prior to going to bed, in order to ease any pain when the anesthesia wears off. You should avoid taking pain medications on an empty stomach as it will make you nauseous.  Do not drink alcoholic beverages or take illicit drugs when taking pain medications.  Pain medication may make you constipated.  Below are a few solutions to try in this order: Decrease the amount of pain medication if you aren't having pain. Drink lots of decaffeinated fluids. Drink prune juice and/or each dried prunes  If the first 3 don't work start with additional solutions Take Colace - an over-the-counter stool softener Take Senokot - an over-the-counter laxative Take Miralax - a stronger over-the-counter laxative

## 2019-08-15 NOTE — Care Management Important Message (Signed)
Important Message  Patient Details IM Letter given to [place] Case Manager to present to the Patient Name: NAMINE BEAHM MRN: [id number] Date of Birth: 08-27-1925   Medicare Important Message Given:  Yes     Kerin Salen 08/15/2019, 12:33 PM

## 2019-08-15 NOTE — Progress Notes (Signed)
     Subjective: 4 Days Post-Op s/p Procedure(s): INTRAMEDULLARY (IM) NAIL INTERTROCHANTRIC  Doing well this morning. Requests her B12 shot which she takes the 19th of every month.    Objective:  PE: VITALS:   Vitals:   08/14/19 1318 08/14/19 2137 08/14/19 2248 08/15/19 0520  BP: 114/61 (!) 111/57  (!) 118/54  Pulse: 72 79 79 89  Resp: 16 16 16 14   Temp: 97.6 F (36.4 C) 98.3 F (36.8 C)  98 F (36.7 C)  TempSrc:  Oral  Oral  SpO2: 100%  99% 94%  Weight:      Height:       Resting in hospital bed, in no acute distress Neurovascular intact Sensation intact distally Intact pulses distally Dorsiflexion/Plantar flexion intact Incision: dressing C/D/I  LABS  Results for orders placed or performed during the hospital encounter of 08/11/19 (from the past 24 hour(s))  SARS CORONAVIRUS 2 (TAT 6-24 HRS) Nasopharyngeal Nasopharyngeal Swab     Status: None   Collection Time: 08/14/19  3:38 PM   Specimen: Nasopharyngeal Swab  Result Value Ref Range   SARS Coronavirus 2 NEGATIVE NEGATIVE  Basic metabolic panel     Status: Abnormal   Collection Time: 08/15/19  4:19 AM  Result Value Ref Range   Sodium 136 135 - 145 mmol/L   Potassium 4.4 3.5 - 5.1 mmol/L   Chloride 102 98 - 111 mmol/L   CO2 26 22 - 32 mmol/L   Glucose, Bld 111 (H) 70 - 99 mg/dL   BUN 24 (H) 8 - 23 mg/dL   Creatinine, Ser 0.61 0.44 - 1.00 mg/dL   Calcium 8.3 (L) 8.9 - 10.3 mg/dL   GFR calc non Af Amer >60 >60 mL/min   GFR calc Af Amer >60 >60 mL/min   Anion gap 8 5 - 15  CBC     Status: Abnormal   Collection Time: 08/15/19  4:19 AM  Result Value Ref Range   WBC 7.3 4.0 - 10.5 K/uL   RBC 2.12 (L) 3.87 - 5.11 MIL/uL   Hemoglobin 7.8 (L) 12.0 - 15.0 g/dL   HCT 24.1 (L) 36 - 46 %   MCV 113.7 (H) 80.0 - 100.0 fL   MCH 36.8 (H) 26.0 - 34.0 pg   MCHC 32.4 30.0 - 36.0 g/dL   RDW 15.3 11.5 - 15.5 %   Platelets 522 (H) 150 - 400 K/uL   nRBC 0.0 0.0 - 0.2 %    No results found.  Assessment/Plan: Active  Problems:   Hip fracture (HCC)  Left hip fracture 4 Days Post-Op s/p Procedure(s): INTRAMEDULLARY (IM) NAIL INTERTROCHANTRIC Weightbearing: WBAT LLE Insicional and dressing care: Reinforce dressings as needed, dressings changed on 7/18.  VTE prophylaxis: Lovenox 40mg  qd  Pain control: continue current regimen Follow - up plan: 1 week in office with Dr. Griffin Basil Dispo: Ready for d/c to SNF when bed available Contact information:  Noemi Chapel, PA-C, Dr. Ophelia Charter  Placed order for patient's monthly B12 injection per patient request.  Cleared for discharge from orthopedics standpoint once cleared by medicine team and therapies. Prescriptions printed and placed in patient's chart   Kathy Pierce 08/15/2019, 6:56 AM

## 2019-08-15 NOTE — Progress Notes (Signed)
Report called to Nunzio Cory at [facility].

## 2019-08-16 LAB — TYPE AND SCREEN
ABO/RH(D): AB POS
Antibody Screen: NEGATIVE
Unit division: 0

## 2019-08-16 LAB — BPAM RBC
Blood Product Expiration Date: 202108072359
ISSUE DATE / TIME: 202107191127
Unit Type and Rh: 6200

## 2019-08-17 DIAGNOSIS — R269 Unspecified abnormalities of gait and mobility: Secondary | ICD-10-CM | POA: Diagnosis not present

## 2019-08-17 DIAGNOSIS — C4490 Unspecified malignant neoplasm of skin, unspecified: Secondary | ICD-10-CM | POA: Diagnosis not present

## 2019-08-17 DIAGNOSIS — C3490 Malignant neoplasm of unspecified part of unspecified bronchus or lung: Secondary | ICD-10-CM | POA: Diagnosis not present

## 2019-08-17 DIAGNOSIS — E039 Hypothyroidism, unspecified: Secondary | ICD-10-CM | POA: Diagnosis not present

## 2019-08-17 DIAGNOSIS — J449 Chronic obstructive pulmonary disease, unspecified: Secondary | ICD-10-CM | POA: Diagnosis not present

## 2019-08-17 DIAGNOSIS — J9611 Chronic respiratory failure with hypoxia: Secondary | ICD-10-CM | POA: Diagnosis not present

## 2019-08-17 DIAGNOSIS — S72002A Fracture of unspecified part of neck of left femur, initial encounter for closed fracture: Secondary | ICD-10-CM | POA: Diagnosis not present

## 2019-08-17 DIAGNOSIS — D519 Vitamin B12 deficiency anemia, unspecified: Secondary | ICD-10-CM | POA: Diagnosis not present

## 2019-08-22 ENCOUNTER — Other Ambulatory Visit (HOSPITAL_COMMUNITY): Payer: Self-pay | Admitting: Orthopedic Surgery

## 2019-08-22 ENCOUNTER — Other Ambulatory Visit: Payer: Self-pay

## 2019-08-22 ENCOUNTER — Ambulatory Visit (HOSPITAL_COMMUNITY)
Admission: RE | Admit: 2019-08-22 | Discharge: 2019-08-22 | Disposition: A | Discharge status: Home / Self Care | Payer: Medicare Other | Source: Ambulatory Visit | Attending: Orthopedic Surgery | Admitting: Orthopedic Surgery

## 2019-08-22 DIAGNOSIS — M7989 Other specified soft tissue disorders: Secondary | ICD-10-CM | POA: Diagnosis not present

## 2019-08-22 DIAGNOSIS — M79605 Pain in left leg: Secondary | ICD-10-CM | POA: Diagnosis not present

## 2019-08-22 DIAGNOSIS — S72142D Displaced intertrochanteric fracture of left femur, subsequent encounter for closed fracture with routine healing: Secondary | ICD-10-CM | POA: Diagnosis not present

## 2019-08-29 DIAGNOSIS — J449 Chronic obstructive pulmonary disease, unspecified: Secondary | ICD-10-CM | POA: Diagnosis not present

## 2019-08-29 DIAGNOSIS — I509 Heart failure, unspecified: Secondary | ICD-10-CM | POA: Diagnosis not present

## 2019-08-29 DIAGNOSIS — I10 Essential (primary) hypertension: Secondary | ICD-10-CM | POA: Diagnosis not present

## 2019-08-29 DIAGNOSIS — M810 Age-related osteoporosis without current pathological fracture: Secondary | ICD-10-CM | POA: Diagnosis not present

## 2019-08-30 ENCOUNTER — Inpatient Hospital Stay (HOSPITAL_COMMUNITY)
Admission: EM | Admit: 2019-08-30 | Discharge: 2019-09-05 | DRG: 177 | Disposition: A | Discharge status: Skilled Nursing Facility | Payer: Medicare Other | Source: Skilled Nursing Facility | Attending: Internal Medicine | Admitting: Internal Medicine

## 2019-08-30 ENCOUNTER — Encounter (HOSPITAL_COMMUNITY): Payer: Self-pay

## 2019-08-30 ENCOUNTER — Emergency Department (HOSPITAL_COMMUNITY): Payer: Medicare Other

## 2019-08-30 ENCOUNTER — Other Ambulatory Visit: Payer: Self-pay

## 2019-08-30 DIAGNOSIS — M810 Age-related osteoporosis without current pathological fracture: Secondary | ICD-10-CM | POA: Diagnosis present

## 2019-08-30 DIAGNOSIS — E039 Hypothyroidism, unspecified: Secondary | ICD-10-CM | POA: Diagnosis not present

## 2019-08-30 DIAGNOSIS — Z7401 Bed confinement status: Secondary | ICD-10-CM | POA: Diagnosis not present

## 2019-08-30 DIAGNOSIS — J9811 Atelectasis: Secondary | ICD-10-CM | POA: Diagnosis not present

## 2019-08-30 DIAGNOSIS — D473 Essential (hemorrhagic) thrombocythemia: Secondary | ICD-10-CM | POA: Diagnosis not present

## 2019-08-30 DIAGNOSIS — Z7951 Long term (current) use of inhaled steroids: Secondary | ICD-10-CM

## 2019-08-30 DIAGNOSIS — Z85828 Personal history of other malignant neoplasm of skin: Secondary | ICD-10-CM | POA: Diagnosis not present

## 2019-08-30 DIAGNOSIS — R636 Underweight: Secondary | ICD-10-CM | POA: Diagnosis present

## 2019-08-30 DIAGNOSIS — M48061 Spinal stenosis, lumbar region without neurogenic claudication: Secondary | ICD-10-CM | POA: Diagnosis not present

## 2019-08-30 DIAGNOSIS — Z7982 Long term (current) use of aspirin: Secondary | ICD-10-CM

## 2019-08-30 DIAGNOSIS — Z79899 Other long term (current) drug therapy: Secondary | ICD-10-CM | POA: Diagnosis not present

## 2019-08-30 DIAGNOSIS — R069 Unspecified abnormalities of breathing: Secondary | ICD-10-CM | POA: Diagnosis not present

## 2019-08-30 DIAGNOSIS — U071 COVID-19: Secondary | ICD-10-CM | POA: Diagnosis not present

## 2019-08-30 DIAGNOSIS — I5043 Acute on chronic combined systolic (congestive) and diastolic (congestive) heart failure: Secondary | ICD-10-CM | POA: Diagnosis not present

## 2019-08-30 DIAGNOSIS — I2510 Atherosclerotic heart disease of native coronary artery without angina pectoris: Secondary | ICD-10-CM | POA: Diagnosis not present

## 2019-08-30 DIAGNOSIS — E538 Deficiency of other specified B group vitamins: Secondary | ICD-10-CM | POA: Diagnosis not present

## 2019-08-30 DIAGNOSIS — Z85118 Personal history of other malignant neoplasm of bronchus and lung: Secondary | ICD-10-CM

## 2019-08-30 DIAGNOSIS — Z923 Personal history of irradiation: Secondary | ICD-10-CM

## 2019-08-30 DIAGNOSIS — J9600 Acute respiratory failure, unspecified whether with hypoxia or hypercapnia: Secondary | ICD-10-CM | POA: Diagnosis not present

## 2019-08-30 DIAGNOSIS — Z9981 Dependence on supplemental oxygen: Secondary | ICD-10-CM | POA: Diagnosis not present

## 2019-08-30 DIAGNOSIS — Z66 Do not resuscitate: Secondary | ICD-10-CM | POA: Diagnosis not present

## 2019-08-30 DIAGNOSIS — C3432 Malignant neoplasm of lower lobe, left bronchus or lung: Secondary | ICD-10-CM | POA: Diagnosis not present

## 2019-08-30 DIAGNOSIS — D62 Acute posthemorrhagic anemia: Secondary | ICD-10-CM | POA: Diagnosis not present

## 2019-08-30 DIAGNOSIS — I5021 Acute systolic (congestive) heart failure: Secondary | ICD-10-CM | POA: Diagnosis not present

## 2019-08-30 DIAGNOSIS — R2681 Unsteadiness on feet: Secondary | ICD-10-CM | POA: Diagnosis not present

## 2019-08-30 DIAGNOSIS — Z87891 Personal history of nicotine dependence: Secondary | ICD-10-CM | POA: Diagnosis not present

## 2019-08-30 DIAGNOSIS — J811 Chronic pulmonary edema: Secondary | ICD-10-CM | POA: Diagnosis not present

## 2019-08-30 DIAGNOSIS — R000 Tachycardia, unspecified: Secondary | ICD-10-CM | POA: Diagnosis not present

## 2019-08-30 DIAGNOSIS — R0902 Hypoxemia: Secondary | ICD-10-CM | POA: Diagnosis not present

## 2019-08-30 DIAGNOSIS — J90 Pleural effusion, not elsewhere classified: Secondary | ICD-10-CM | POA: Diagnosis not present

## 2019-08-30 DIAGNOSIS — Z96642 Presence of left artificial hip joint: Secondary | ICD-10-CM | POA: Diagnosis present

## 2019-08-30 DIAGNOSIS — I110 Hypertensive heart disease with heart failure: Secondary | ICD-10-CM | POA: Diagnosis not present

## 2019-08-30 DIAGNOSIS — J449 Chronic obstructive pulmonary disease, unspecified: Secondary | ICD-10-CM | POA: Diagnosis not present

## 2019-08-30 DIAGNOSIS — D7589 Other specified diseases of blood and blood-forming organs: Secondary | ICD-10-CM | POA: Diagnosis present

## 2019-08-30 DIAGNOSIS — K219 Gastro-esophageal reflux disease without esophagitis: Secondary | ICD-10-CM | POA: Diagnosis present

## 2019-08-30 DIAGNOSIS — E876 Hypokalemia: Secondary | ICD-10-CM | POA: Diagnosis present

## 2019-08-30 DIAGNOSIS — I509 Heart failure, unspecified: Secondary | ICD-10-CM

## 2019-08-30 DIAGNOSIS — Z79890 Hormone replacement therapy (postmenopausal): Secondary | ICD-10-CM | POA: Diagnosis not present

## 2019-08-30 DIAGNOSIS — M2550 Pain in unspecified joint: Secondary | ICD-10-CM | POA: Diagnosis not present

## 2019-08-30 DIAGNOSIS — R278 Other lack of coordination: Secondary | ICD-10-CM | POA: Diagnosis not present

## 2019-08-30 DIAGNOSIS — J9601 Acute respiratory failure with hypoxia: Secondary | ICD-10-CM | POA: Diagnosis not present

## 2019-08-30 DIAGNOSIS — R0602 Shortness of breath: Secondary | ICD-10-CM | POA: Diagnosis not present

## 2019-08-30 DIAGNOSIS — Z681 Body mass index (BMI) 19 or less, adult: Secondary | ICD-10-CM

## 2019-08-30 DIAGNOSIS — I10 Essential (primary) hypertension: Secondary | ICD-10-CM | POA: Diagnosis not present

## 2019-08-30 DIAGNOSIS — H04123 Dry eye syndrome of bilateral lacrimal glands: Secondary | ICD-10-CM | POA: Diagnosis not present

## 2019-08-30 DIAGNOSIS — M6281 Muscle weakness (generalized): Secondary | ICD-10-CM | POA: Diagnosis not present

## 2019-08-30 DIAGNOSIS — C3490 Malignant neoplasm of unspecified part of unspecified bronchus or lung: Secondary | ICD-10-CM | POA: Diagnosis not present

## 2019-08-30 DIAGNOSIS — I700 Atherosclerosis of aorta: Secondary | ICD-10-CM | POA: Diagnosis not present

## 2019-08-30 DIAGNOSIS — I5033 Acute on chronic diastolic (congestive) heart failure: Secondary | ICD-10-CM | POA: Diagnosis not present

## 2019-08-30 DIAGNOSIS — J9621 Acute and chronic respiratory failure with hypoxia: Secondary | ICD-10-CM | POA: Diagnosis not present

## 2019-08-30 DIAGNOSIS — F329 Major depressive disorder, single episode, unspecified: Secondary | ICD-10-CM | POA: Diagnosis present

## 2019-08-30 DIAGNOSIS — J9611 Chronic respiratory failure with hypoxia: Secondary | ICD-10-CM | POA: Diagnosis present

## 2019-08-30 DIAGNOSIS — G4700 Insomnia, unspecified: Secondary | ICD-10-CM | POA: Diagnosis present

## 2019-08-30 DIAGNOSIS — M79605 Pain in left leg: Secondary | ICD-10-CM | POA: Diagnosis not present

## 2019-08-30 DIAGNOSIS — Z4789 Encounter for other orthopedic aftercare: Secondary | ICD-10-CM | POA: Diagnosis not present

## 2019-08-30 LAB — CBC WITH DIFFERENTIAL/PLATELET
Abs Immature Granulocytes: 0.13 10*3/uL — ABNORMAL HIGH (ref 0.00–0.07)
Basophils Absolute: 0 10*3/uL (ref 0.0–0.1)
Basophils Relative: 0 %
Eosinophils Absolute: 0.2 10*3/uL (ref 0.0–0.5)
Eosinophils Relative: 1 %
HCT: 36.6 % (ref 36.0–46.0)
Hemoglobin: 11.6 g/dL — ABNORMAL LOW (ref 12.0–15.0)
Immature Granulocytes: 1 %
Lymphocytes Relative: 3 %
Lymphs Abs: 0.4 10*3/uL — ABNORMAL LOW (ref 0.7–4.0)
MCH: 36.3 pg — ABNORMAL HIGH (ref 26.0–34.0)
MCHC: 31.7 g/dL (ref 30.0–36.0)
MCV: 114.4 fL — ABNORMAL HIGH (ref 80.0–100.0)
Monocytes Absolute: 1.6 10*3/uL — ABNORMAL HIGH (ref 0.1–1.0)
Monocytes Relative: 13 %
Neutro Abs: 9.3 10*3/uL — ABNORMAL HIGH (ref 1.7–7.7)
Neutrophils Relative %: 82 %
Platelets: 817 10*3/uL — ABNORMAL HIGH (ref 150–400)
RBC: 3.2 MIL/uL — ABNORMAL LOW (ref 3.87–5.11)
RDW: 18.4 % — ABNORMAL HIGH (ref 11.5–15.5)
WBC: 11.6 10*3/uL — ABNORMAL HIGH (ref 4.0–10.5)
nRBC: 0 % (ref 0.0–0.2)

## 2019-08-30 LAB — CBC
HCT: 36.7 % (ref 36.0–46.0)
Hemoglobin: 11.7 g/dL — ABNORMAL LOW (ref 12.0–15.0)
MCH: 36.4 pg — ABNORMAL HIGH (ref 26.0–34.0)
MCHC: 31.9 g/dL (ref 30.0–36.0)
MCV: 114.3 fL — ABNORMAL HIGH (ref 80.0–100.0)
Platelets: 814 10*3/uL — ABNORMAL HIGH (ref 150–400)
RBC: 3.21 MIL/uL — ABNORMAL LOW (ref 3.87–5.11)
RDW: 18.6 % — ABNORMAL HIGH (ref 11.5–15.5)
WBC: 9.5 10*3/uL (ref 4.0–10.5)
nRBC: 0 % (ref 0.0–0.2)

## 2019-08-30 LAB — CREATININE, SERUM
Creatinine, Ser: 0.58 mg/dL (ref 0.44–1.00)
GFR calc Af Amer: >60 mL/min (ref >60)
GFR calc non Af Amer: >60 mL/min (ref >60)

## 2019-08-30 LAB — D-DIMER, QUANTITATIVE: D-Dimer, Quant: 3.13 ug/mL-FEU — ABNORMAL HIGH (ref 0.00–0.50)

## 2019-08-30 LAB — BASIC METABOLIC PANEL
Anion gap: 11 (ref 5–15)
BUN: 17 mg/dL (ref 8–23)
CO2: 25 mmol/L (ref 22–32)
Calcium: 8.4 mg/dL — ABNORMAL LOW (ref 8.9–10.3)
Chloride: 101 mmol/L (ref 98–111)
Creatinine, Ser: 0.48 mg/dL (ref 0.44–1.00)
GFR calc Af Amer: >60 mL/min (ref >60)
GFR calc non Af Amer: >60 mL/min (ref >60)
Glucose, Bld: 142 mg/dL — ABNORMAL HIGH (ref 70–99)
Potassium: 4.2 mmol/L (ref 3.5–5.1)
Sodium: 137 mmol/L (ref 135–145)

## 2019-08-30 LAB — SARS CORONAVIRUS 2 BY RT PCR (HOSPITAL ORDER, PERFORMED IN ~~LOC~~ HOSPITAL LAB): SARS Coronavirus 2: POSITIVE — AB

## 2019-08-30 LAB — BRAIN NATRIURETIC PEPTIDE: B Natriuretic Peptide: 236.5 pg/mL — ABNORMAL HIGH (ref 0.0–100.0)

## 2019-08-30 LAB — C-REACTIVE PROTEIN: CRP: 1.2 mg/dL — ABNORMAL HIGH (ref <1.0)

## 2019-08-30 LAB — PROCALCITONIN: Procalcitonin: <0.1 ng/mL

## 2019-08-30 MED ORDER — MELATONIN 3 MG PO TABS
6.0000 mg | ORAL_TABLET | Freq: Every evening | ORAL | Status: DC | PRN
  Administered 2019-08-31 – 2019-09-02 (×4): 6 mg via ORAL
  Filled 2019-08-30 (×4): qty 2

## 2019-08-30 MED ORDER — POLYETHYLENE GLYCOL 3350 17 G PO PACK: 17.0000 g | PACK | Freq: Every day | ORAL | Status: DC | PRN

## 2019-08-30 MED ORDER — ZINC SULFATE 220 (50 ZN) MG PO CAPS
220.0000 mg | ORAL_CAPSULE | Freq: Every day | ORAL | Status: DC
  Administered 2019-08-30 – 2019-09-05 (×7): 220 mg via ORAL
  Filled 2019-08-30 (×6): qty 1

## 2019-08-30 MED ORDER — SODIUM CHLORIDE 0.9 % IV SOLN
100.0000 mg | Freq: Every day | INTRAVENOUS | Status: AC
  Administered 2019-08-31 – 2019-09-03 (×4): 100 mg via INTRAVENOUS
  Filled 2019-08-30 (×4): qty 20

## 2019-08-30 MED ORDER — GUAIFENESIN-DM 100-10 MG/5ML PO SYRP
10.0000 mL | ORAL_SOLUTION | ORAL | Status: DC | PRN
  Administered 2019-08-31 – 2019-09-02 (×6): 10 mL via ORAL
  Filled 2019-08-30 (×7): qty 10

## 2019-08-30 MED ORDER — CALCIUM CARBONATE 1250 (500 CA) MG PO TABS
1.0000 | ORAL_TABLET | Freq: Every evening | ORAL | Status: DC
  Administered 2019-08-30 – 2019-09-04 (×6): 500 mg via ORAL
  Filled 2019-08-30 (×6): qty 1

## 2019-08-30 MED ORDER — ASCORBIC ACID 500 MG PO TABS
500.0000 mg | ORAL_TABLET | Freq: Every day | ORAL | Status: DC
  Administered 2019-08-30 – 2019-09-05 (×7): 500 mg via ORAL
  Filled 2019-08-30 (×7): qty 1

## 2019-08-30 MED ORDER — MECLIZINE HCL 25 MG PO TABS: 25.0000 mg | ORAL_TABLET | Freq: Three times a day (TID) | ORAL | Status: DC | PRN

## 2019-08-30 MED ORDER — SODIUM CHLORIDE 0.9 % IV SOLN
100.0000 mg | Freq: Every day | INTRAVENOUS | Status: DC
Start: 2019-08-31 — End: 2019-08-30

## 2019-08-30 MED ORDER — SERTRALINE HCL 50 MG PO TABS
50.0000 mg | ORAL_TABLET | Freq: Every day | ORAL | Status: DC
  Administered 2019-08-30 – 2019-09-05 (×7): 50 mg via ORAL
  Filled 2019-08-30 (×7): qty 1

## 2019-08-30 MED ORDER — IOHEXOL 350 MG/ML SOLN
100.0000 mL | Freq: Once | INTRAVENOUS | Status: AC | PRN
  Administered 2019-08-30: 80 mL via INTRAVENOUS

## 2019-08-30 MED ORDER — BENZONATATE 100 MG PO CAPS
100.0000 mg | ORAL_CAPSULE | Freq: Three times a day (TID) | ORAL | Status: DC | PRN
  Administered 2019-08-31 – 2019-09-02 (×4): 100 mg via ORAL
  Filled 2019-08-30 (×4): qty 1

## 2019-08-30 MED ORDER — ACETAMINOPHEN 325 MG PO TABS: 650.0000 mg | ORAL_TABLET | Freq: Four times a day (QID) | ORAL | Status: DC | PRN

## 2019-08-30 MED ORDER — UMECLIDINIUM-VILANTEROL 62.5-25 MCG/INH IN AEPB
1.0000 | INHALATION_SPRAY | Freq: Every day | RESPIRATORY_TRACT | Status: DC
  Administered 2019-08-31 – 2019-09-05 (×6): 1 via RESPIRATORY_TRACT
  Filled 2019-08-30: qty 14

## 2019-08-30 MED ORDER — HYDROXYUREA 500 MG PO CAPS
1000.0000 mg | ORAL_CAPSULE | ORAL | Status: DC
  Administered 2019-09-01 – 2019-09-05 (×4): 1000 mg via ORAL
  Filled 2019-08-30 (×3): qty 2

## 2019-08-30 MED ORDER — POLYVINYL ALCOHOL 1.4 % OP SOLN
1.0000 [drp] | OPHTHALMIC | Status: DC | PRN
  Filled 2019-08-30: qty 15

## 2019-08-30 MED ORDER — FERROUS SULFATE 325 (65 FE) MG PO TABS
325.0000 mg | ORAL_TABLET | Freq: Every day | ORAL | Status: DC
  Administered 2019-08-30 – 2019-09-05 (×7): 325 mg via ORAL
  Filled 2019-08-30 (×7): qty 1

## 2019-08-30 MED ORDER — ASPIRIN 81 MG PO TABS: 81.0000 mg | ORAL_TABLET | Freq: Every day | ORAL | Status: DC

## 2019-08-30 MED ORDER — PANTOPRAZOLE SODIUM 40 MG PO TBEC
40.0000 mg | DELAYED_RELEASE_TABLET | Freq: Every day | ORAL | Status: DC
  Administered 2019-08-30 – 2019-09-05 (×7): 40 mg via ORAL
  Filled 2019-08-30 (×7): qty 1

## 2019-08-30 MED ORDER — OXYCODONE HCL 5 MG PO TABS
5.0000 mg | ORAL_TABLET | Freq: Four times a day (QID) | ORAL | Status: DC | PRN
  Administered 2019-08-30: 5 mg via ORAL
  Filled 2019-08-30 (×2): qty 1

## 2019-08-30 MED ORDER — FOLIC ACID 1 MG PO TABS
1.0000 mg | ORAL_TABLET | Freq: Every day | ORAL | Status: DC
  Administered 2019-08-30 – 2019-09-05 (×7): 1 mg via ORAL
  Filled 2019-08-30 (×7): qty 1

## 2019-08-30 MED ORDER — ASPIRIN EC 81 MG PO TBEC
81.0000 mg | DELAYED_RELEASE_TABLET | Freq: Every day | ORAL | Status: DC
  Administered 2019-08-30 – 2019-09-05 (×7): 81 mg via ORAL
  Filled 2019-08-30 (×7): qty 1

## 2019-08-30 MED ORDER — DEXAMETHASONE SODIUM PHOSPHATE 10 MG/ML IJ SOLN
6.0000 mg | INTRAMUSCULAR | Status: DC
  Administered 2019-08-30 – 2019-09-05 (×7): 6 mg via INTRAVENOUS
  Filled 2019-08-30 (×7): qty 1

## 2019-08-30 MED ORDER — SODIUM CHLORIDE 0.9 % IV SOLN: 200.0000 mg | Freq: Once | INTRAVENOUS | Status: DC

## 2019-08-30 MED ORDER — FUROSEMIDE 10 MG/ML IJ SOLN
20.0000 mg | Freq: Every day | INTRAMUSCULAR | Status: DC
  Administered 2019-08-30 – 2019-08-31 (×2): 20 mg via INTRAVENOUS
  Filled 2019-08-30: qty 2
  Filled 2019-08-30: qty 4
  Filled 2019-08-30: qty 2

## 2019-08-30 MED ORDER — HYDROXYUREA 500 MG PO CAPS: 500.0000 mg | ORAL_CAPSULE | ORAL | Status: DC

## 2019-08-30 MED ORDER — ATENOLOL 25 MG PO TABS
25.0000 mg | ORAL_TABLET | Freq: Every day | ORAL | Status: DC
  Administered 2019-08-31 – 2019-09-05 (×6): 25 mg via ORAL
  Filled 2019-08-30 (×6): qty 1

## 2019-08-30 MED ORDER — ALBUTEROL SULFATE HFA 108 (90 BASE) MCG/ACT IN AERS
2.0000 | INHALATION_SPRAY | Freq: Four times a day (QID) | RESPIRATORY_TRACT | Status: DC | PRN
  Administered 2019-08-30: 2 via RESPIRATORY_TRACT

## 2019-08-30 MED ORDER — ONDANSETRON HCL 4 MG PO TABS: 4.0000 mg | ORAL_TABLET | Freq: Four times a day (QID) | ORAL | Status: DC | PRN

## 2019-08-30 MED ORDER — ALBUTEROL SULFATE HFA 108 (90 BASE) MCG/ACT IN AERS
2.0000 | INHALATION_SPRAY | Freq: Four times a day (QID) | RESPIRATORY_TRACT | Status: DC
  Administered 2019-08-30 – 2019-09-05 (×23): 2 via RESPIRATORY_TRACT
  Filled 2019-08-30 (×2): qty 6.7

## 2019-08-30 MED ORDER — PROSIGHT PO TABS
ORAL_TABLET | Freq: Every day | ORAL | Status: DC
  Administered 2019-08-31 – 2019-09-05 (×6): 1 via ORAL
  Filled 2019-08-30 (×6): qty 1

## 2019-08-30 MED ORDER — HYDROXYUREA 500 MG PO CAPS
500.0000 mg | ORAL_CAPSULE | ORAL | Status: DC
  Administered 2019-08-31 – 2019-09-04 (×4): 500 mg via ORAL
  Filled 2019-08-30 (×5): qty 1

## 2019-08-30 MED ORDER — SODIUM CHLORIDE (PF) 0.9 % IJ SOLN
INTRAMUSCULAR | Status: AC
  Filled 2019-08-30: qty 50

## 2019-08-30 MED ORDER — ONDANSETRON HCL 4 MG/2ML IJ SOLN
4.0000 mg | Freq: Four times a day (QID) | INTRAMUSCULAR | Status: DC | PRN
  Administered 2019-09-04: 4 mg via INTRAVENOUS
  Filled 2019-08-30: qty 2

## 2019-08-30 MED ORDER — SODIUM CHLORIDE 0.9 % IV SOLN
100.0000 mg | INTRAVENOUS | Status: AC
  Administered 2019-08-30 (×2): 100 mg via INTRAVENOUS
  Filled 2019-08-30: qty 20

## 2019-08-30 MED ORDER — LEVOTHYROXINE SODIUM 25 MCG PO TABS
25.0000 ug | ORAL_TABLET | Freq: Every day | ORAL | Status: DC
  Administered 2019-08-30 – 2019-09-05 (×7): 25 ug via ORAL
  Filled 2019-08-30 (×7): qty 1

## 2019-08-30 MED ORDER — FUROSEMIDE 10 MG/ML IJ SOLN
20.0000 mg | Freq: Once | INTRAMUSCULAR | Status: AC
  Administered 2019-08-30: 20 mg via INTRAVENOUS
  Filled 2019-08-30: qty 4

## 2019-08-30 MED ORDER — DOCUSATE SODIUM 100 MG PO CAPS
100.0000 mg | ORAL_CAPSULE | Freq: Two times a day (BID) | ORAL | Status: DC
  Administered 2019-08-30 – 2019-09-05 (×10): 100 mg via ORAL
  Filled 2019-08-30 (×13): qty 1

## 2019-08-30 MED ORDER — ENOXAPARIN SODIUM 40 MG/0.4ML ~~LOC~~ SOLN
40.0000 mg | SUBCUTANEOUS | Status: DC
  Administered 2019-08-30 – 2019-09-05 (×7): 40 mg via SUBCUTANEOUS
  Filled 2019-08-30 (×8): qty 0.4

## 2019-08-30 NOTE — ED Notes (Signed)
Patient transported to CT 

## 2019-08-30 NOTE — ED Triage Notes (Signed)
Arrived by EMS from [facility]. Facility reports low O2 saturation in the 70"s about 15 minutes prior to EMS arrival. EMS reports patient in mid 80's on 2 L/[place]. Patient placed on non-rebreather , now 100%. Patient received 125 mg Solumedrol IV en route to this facility.

## 2019-08-30 NOTE — H&P (Signed)
History and Physical    Kathy Pierce VPX:[id number] DOB: [birth year] DOA: 08/30/2019  PCP: London Pepper, MD   Patient coming from: [facility].  Chief Complaint  Patient presents with  . Respiratory Distress      HPI: Kathy Pierce is a 86 y.o. female with medical history significant for HFpEF,COPD chronic hypoxic respiratory failure on 3 to nasal cannula, hypertension, prior history of lung cancer, left leg squamous cell carcinoma postradiation 2018, depression, osteoporosis, B12 deficiency, underweight, hypothyroidism presents from skilled nursing facility with hypoxic in 70s about 15 minutes prior to EMS arrival placed on nonrebreather and brought to the ED.  She was given Solu-Medrol 125 mg.  Patient complained of shortness of breath for last several days, with orthopnea, worsening leg swelling.  She complains of dry cough but no chest pain fever chills nausea vomiting. She was recently discharged to snf on 7/19 after treatment of left intertrochanteric hip fracture secondary to fall,, post op blood loss anemia  In the ED: Still hypoxic 86% room air subsequently placed on 6 L nasal cannula and weaned down to 4 L.  Stable blood pressure, tachypneic mildly in mid 20s, heart rate in the 80s-100.  Wbc at 11.6, slightly positive proBNP 236.5. Chest x-ray showed pulmonary congestion, underwent CT angio chest :" No evidence of significant pulmonary embolus. 2. Cardiac enlargement. Bilateral pleural effusions with interstitial and airspace infiltrates, likely edema" patient was suspected to have acute on chronic diastolic CHF, given IV Lasix.COVID-19 came back positive.  Patient is being started on COVID-19 treatment protocol along with inflammatory markers She had covid vaccine in January she says.  Review of Systems: All systems were reviewed and were negative except as mentioned in HPI above. Negative for fever Negative for chest pain Negative for shortness of breath  Past Medical History:   Diagnosis Date  . Cancer (HCC)    Squamous cell carcinoma, skin, basal cell carcinoma  . Complication of anesthesia    wakes up slowly   . COPD (chronic obstructive pulmonary disease) ([place])   . Depression   . Dyspnea   . Essential thrombocythemia ([place])   . Hypertension   . Lung cancer ([place]) dx'd 2017  . Osteoporosis   . Sciatica of left side 06/16/2012  . Spinal stenosis of lumbar region 06/16/2012  . Vitamin B12 deficiency     Past Surgical History:  Procedure Laterality Date  . APPENDECTOMY    . CATARACT EXTRACTION, BILATERAL Bilateral   . INTRAMEDULLARY (IM) NAIL INTERTROCHANTERIC Left 08/11/2019   Procedure: INTRAMEDULLARY (IM) NAIL INTERTROCHANTRIC;  Surgeon: Hiram Gash, MD;  Location: [facility];  Service: Orthopedics;  Laterality: Left;  . STAPEDECTOMY Right   . TONSILLECTOMY    . VIDEO BRONCHOSCOPY WITH ENDOBRONCHIAL NAVIGATION N/A 02/11/2016   Procedure: VIDEO BRONCHOSCOPY WITH ENDOBRONCHIAL NAVIGATION;  Surgeon: Grace Isaac, MD;  Location: [place];  Service: Thoracic;  Laterality: N/A;  . VIDEO BRONCHOSCOPY WITH ENDOBRONCHIAL ULTRASOUND N/A 02/11/2016   Procedure: VIDEO BRONCHOSCOPY WITH ENDOBRONCHIAL ULTRASOUND with biopsy of nodes #7 and #10;  Surgeon: Grace Isaac, MD;  Location: [place];  Service: Thoracic;  Laterality: N/A;     reports that she quit smoking about 43 years ago. Her smoking use included cigarettes. She has a 21.00 pack-year smoking history. She has never used smokeless tobacco. She reports that she does not drink alcohol and does not use drugs.  Allergies  Allergen Reactions  . Penicillins Hives and Rash    Did it involve swelling of  the face/tongue/throat, SOB, or low BP? N Did it involve sudden or severe rash/hives, skin peeling, or any reaction on the inside of your mouth or nose? Y Did you need to seek medical attention at a hospital or doctor's office? N When did it last happen?Over 39 Years Ago If all above answers are "NO", may  proceed with cephalosporin use.    . Codeine Nausea And Vomiting  . Erythromycin Nausea And Vomiting  . Sulfa Drugs Cross Reactors Nausea And Vomiting    Family History  Problem Relation Age of Onset  . Heart attack Father   . Cancer Mother   . Cancer Sister   . Cancer Brother   . Cancer Brother   . Cancer Daughter      Prior to Admission medications   Medication Sig Start Date End Date Taking? Authorizing Provider  albuterol (PROVENTIL HFA;VENTOLIN HFA) 108 (90 Base) MCG/ACT inhaler Inhale 2 puffs into the lungs every 6 (six) hours as needed for wheezing or shortness of breath.   Yes [provider]  aspirin 81 MG tablet Take 81 mg by mouth daily.   Yes [provider]  atenolol (TENORMIN) 25 MG tablet Take 25 mg by mouth daily.  07/26/19  Yes [provider]  benzonatate (TESSALON) 100 MG capsule Take 100 mg by mouth 3 (three) times daily as needed for cough.    Yes [provider]  calcium carbonate (OS-CAL - DOSED IN MG OF ELEMENTAL CALCIUM) 1250 MG tablet Take 1 tablet by mouth every evening.    Yes [provider]  cyanocobalamin (,VITAMIN B-12,) 1000 MCG/ML injection Inject 1,000 mcg into the muscle every 30 (thirty) days.   Yes [provider]  docusate sodium (COLACE) 100 MG capsule Take 1 capsule (100 mg total) by mouth 2 (two) times daily. 08/15/19  Yes Lavina Hamman, MD  enoxaparin (LOVENOX) 40 MG/0.4ML injection Inject 0.4 mLs (40 mg total) into the skin daily. For DVT prophylaxis after surgery 08/12/19 09/11/19 Yes McBane, Maylene Roes, PA-C  ferrous sulfate 325 (65 FE) MG tablet Take 1 tablet (325 mg total) by mouth daily with breakfast. 08/16/19 09/15/19 Yes Lavina Hamman, MD  folic acid (FOLVITE) 1 MG tablet Take 1 tablet (1 mg total) by mouth daily. 08/16/19  Yes Lavina Hamman, MD  furosemide (LASIX) 20 MG tablet Take 1 tablet (20 mg total) by mouth every other day. 07/29/19  Yes Nita Sells, MD  hydroxyurea  (HYDREA) 500 MG capsule Take 500-1,000 mg by mouth every other day. May take with food to minimize GI side effects. Alternate Take 1 capsule (500 mg) and Take 2 capsules (1000 mg) Other Day   Yes [provider]  Hypromellose (ARTIFICIAL TEARS OP) Place 1 drop into both eyes as needed (for dry eyes).   Yes [provider]  levothyroxine (SYNTHROID) 25 MCG tablet Take 25 mcg by mouth daily. 08/24/18  Yes [provider]  meclizine (ANTIVERT) 25 MG tablet Take 25 mg by mouth 3 (three) times daily as needed for dizziness.   Yes [provider]  Multiple Vitamins-Minerals (PRESERVISION AREDS PO) Take 1 tablet by mouth daily.   Yes [provider]  omeprazole (PRILOSEC) 20 MG capsule Take 20 mg by mouth every morning.  07/11/19  Yes [provider]  oxyCODONE (OXY IR/ROXICODONE) 5 MG immediate release tablet Take 1/2 to 1 pill every 6 hrs as needed for pain 08/12/19  Yes McBane, Maylene Roes, PA-C  polyethylene glycol (MIRALAX / GLYCOLAX)  17 g packet Take 17 g by mouth daily. 08/16/19  Yes Lavina Hamman, MD  sertraline (ZOLOFT) 100 MG tablet Take 50 mg by mouth daily.  07/26/19  Yes [provider]  umeclidinium-vilanterol (ANORO ELLIPTA) 62.5-25 MCG/INH AEPB Inhale 1 puff into the lungs daily. 06/28/19  Yes Marshell Garfinkel, MD    Physical Exam: Vitals:   08/30/19 0701 08/30/19 0730 08/30/19 0747 08/30/19 0748  BP: 96/65 121/71    Pulse: 83 99    Resp: (!) 23   (!) 25  Temp:      TempSrc:      SpO2: 94%  98%   Weight:      Height:        General exam: AAOx3,weak appearing. HEENT:Oral mucosa moist, Ear/Nose WNL grossly, dentition normal. Respiratory system: bilaterally diminished,no use of accessory muscle Cardiovascular system: S1 & S2 +, No JVD,. Gastrointestinal system: Abdomen soft, NT,ND, BS+ Nervous System:Alert, awake, moving extremities and grossly nonfocal Extremities:  Chronic appearing LLE swelling/hyperpigmentation, left hip  surgical site incision healed. Leg edema mostly on LLE, distal peripheral pulses palpable.  Skin: No rashes,no icterus. MSK: Normal muscle bulk,tone, power  Extensive is getting bruising left lower extremity with a cane where she is up to thigh, pitting edema weeping and asymmetry medial leg  Labs on Admission: I have personally reviewed following labs and imaging studies  CBC: Recent Labs  Lab 08/30/19 0310  WBC 11.6*  NEUTROABS 9.3*  HGB 11.6*  HCT 36.6  MCV 114.4*  PLT 884*   Basic Metabolic Panel: Recent Labs  Lab 08/30/19 0310  NA 137  K 4.2  CL 101  CO2 25  GLUCOSE 142*  BUN 17  CREATININE 0.48  CALCIUM 8.4*   GFR: Estimated Creatinine Clearance: 37.7 mL/min (by C-G formula based on SCr of 0.48 mg/dL). Liver Function Tests: No results for input(s): AST, ALT, ALKPHOS, BILITOT, PROT, ALBUMIN in the last 168 hours. No results for input(s): LIPASE, AMYLASE in the last 168 hours. No results for input(s): AMMONIA in the last 168 hours. Coagulation Profile: No results for input(s): INR, PROTIME in the last 168 hours. Cardiac Enzymes: No results for input(s): CKTOTAL, CKMB, CKMBINDEX, TROPONINI in the last 168 hours. BNP (last 3 results) No results for input(s): PROBNP in the last 8760 hours. HbA1C: No results for input(s): HGBA1C in the last 72 hours. CBG: No results for input(s): GLUCAP in the last 168 hours. Lipid Profile: No results for input(s): CHOL, HDL, LDLCALC, TRIG, CHOLHDL, LDLDIRECT in the last 72 hours. Thyroid Function Tests: No results for input(s): TSH, T4TOTAL, FREET4, T3FREE, THYROIDAB in the last 72 hours. Anemia Panel: No results for input(s): VITAMINB12, FOLATE, FERRITIN, TIBC, IRON, RETICCTPCT in the last 72 hours. Urine analysis: No results found for: COLORURINE, APPEARANCEUR, LABSPEC, PHURINE, GLUCOSEU, HGBUR, BILIRUBINUR, KETONESUR, PROTEINUR, UROBILINOGEN, NITRITE, LEUKOCYTESUR  Radiological Exams on Admission: CT Angio Chest PE W  and/or Wo Contrast  Result Date: 08/30/2019 CLINICAL DATA:  Suspected pulmonary embolus. High probability. Low oxygen saturation. Elevated white cell count. History of hypertension and lung cancer. EXAM: CT ANGIOGRAPHY CHEST WITH CONTRAST TECHNIQUE: Multidetector CT imaging of the chest was performed using the standard protocol during bolus administration of intravenous contrast. Multiplanar CT image reconstructions and MIPs were obtained to evaluate the vascular anatomy. CONTRAST:  110mL OMNIPAQUE IOHEXOL 350 MG/ML SOLN COMPARISON:  07/27/2019 FINDINGS: Cardiovascular: Motion artifact limits the examination. There is good opacification of the central and segmental pulmonary arteries. No focal filling defects. No evidence of significant pulmonary  embolus. Cardiac enlargement. Coronary artery and aortic calcifications. No pericardial effusions. Normal caliber aorta. Mediastinum/Nodes: No enlarged mediastinal, hilar, or axillary lymph nodes. Thyroid gland, trachea, and esophagus demonstrate no significant findings. Lungs/Pleura: Small bilateral pleural effusions. Atelectasis in the lung bases. Mosaic pattern to the lungs may be due to air trapping or edema. Interstitial thickening is likely edema. Peribronchial thickening with patent airways. Appearances are progressed since the previous study. Upper Abdomen: No acute abnormality. Musculoskeletal: Degenerative changes in the spine. Midthoracic vertebral compression deformities without change since the prior study. Review of the MIP images confirms the above findings. IMPRESSION: 1. No evidence of significant pulmonary embolus. 2. Cardiac enlargement. Bilateral pleural effusions with interstitial and airspace infiltrates, likely edema. Progression since previous study. 3. Aortic atherosclerosis. Aortic Atherosclerosis (ICD10-I70.0). Electronically Signed   By: Lucienne Capers M.D.   On: 08/30/2019 05:56   DG Chest Portable 1 View  Result Date: 08/30/2019 CLINICAL  DATA:  Shortness of breath EXAM: PORTABLE CHEST 1 VIEW COMPARISON:  08/11/2019 FINDINGS: Cardiac enlargement with central pulmonary vascular congestion. Developing interstitial changes suggesting edema. Scarring and postoperative change in the left mid lung is unchanged. No pleural effusions. Mediastinal contours appear intact. IMPRESSION: Cardiac enlargement with central pulmonary vascular congestion and developing interstitial edema. Stable scarring and postoperative change in the left mid lung related to previous radiation therapy. Electronically Signed   By: Lucienne Capers M.D.   On: 08/30/2019 03:23     Assessment/Plan Acute respiratory failure with hypoxia secondary to COVID-19 infection also with suspected acute on chronic diastolic CHF.  We will treat the patient with COVID-19 protocol remdesivir and steroids, follow-up procalcitonin, CRP, D-dimer, procalcitonin. CTA no PE. Continue supplemental oxygen, inhalers, vitamin C/zinc tablets.  Continue airborne precaution.  Monitor inflammatory markers daily.  Chronic diastolic CHF, keep on IV Lasix for now gently.Monitor intake, output and daily weight.  COPD/chronic hypoxic respiratory failure on 3 to nasal cannula: Initially needing 6 L,now weaned down to 4 L nasal cannula.    Prior history of lung cancer/left leg squamous cell carcinoma postradiation 2018.  Depression:Continue Zoloft  Left intertrochanteric fracture needing operative intervention/osteoporosis: Continue her vitamin D calcium supplementation, PT OT.  Patient has chronic hyperpigmented left lower extremity.  Surgical site appears to be healing.    B12 deficiency: On monthly B12 injection.  Essential thrombocythemia: Continue her Hydrea.  Body mass index is 19.74 kg/m.   Severity of Illness: * I certify that at the point of admission it is my clinical judgment that the patient will require inpatient hospital care spanning beyond 2 midnights from the point of admission  due to high intensity of service, high risk for further deterioration and high frequency of surveillance required.*    DVT prophylaxis: enoxaparin (LOVENOX) injection 40 mg Start: 08/30/19 0800 Code Status:   Code Status: DNR  Family Communication: Admission, patients condition and plan of care including tests being ordered have been discussed with the patient who indicate understanding and agree with the plan and Code Status. Pt reports she has talked to her family. I called her daughter [name], unable to reach her, left message to call us back. Patient's daughter called back updated and aware about her condition, agrees with DNR.  She reports she was Iin ED this am- is going to quarantine for now.  Consults called:  None  Antonieta Pert MD [facility]  If 7PM-7AM, please contact night-coverage [email or web address]  08/30/2019, 7:54 AM

## 2019-08-30 NOTE — ED Notes (Signed)
Attempted to call report, RN is in a [place] room and requests that I call back.

## 2019-08-30 NOTE — ED Notes (Signed)
Call received from pt granddaughter Lyn Records [phone] requesting rtn call for pt status/updates. [employer]

## 2019-08-30 NOTE — ED Provider Notes (Signed)
[facility] DEPT Provider Note   CSN: [id number] Arrival date & time: 08/30/19  0207     History Chief Complaint  Patient presents with  . Respiratory Distress    Kathy Pierce is a 86 y.o. female.  HPI     This is a 86 year old female with a history of COPD, CHF on 3 L of chronic oxygen, hypertension, lung cancer who presents with shortness of breath.  Patient reports progressive worsening shortness of breath over the last several days.  She reports orthopnea.  She states that her left leg has become more swollen.  She states any time that this happens "my lungs and my heart fill up to."  She did recently have a hip fracture which was repaired on 7/15.  She reports that she is taking her Lovenox injections.  She is not had any fevers or cough.  She is fully vaccinated for COVID-19.  Per EMS, she was noted to be hypoxic in the 80s on her 3 L of oxygen.  She was placed on a nonrebreather with improvement of her symptoms.  She was also given steroids.  Patient states she feels somewhat better.  Past Medical History:  Diagnosis Date  . Cancer (HCC)    Squamous cell carcinoma, skin, basal cell carcinoma  . Complication of anesthesia    wakes up slowly   . COPD (chronic obstructive pulmonary disease) ([place])   . Depression   . Dyspnea   . Essential thrombocythemia ([place])   . Hypertension   . Lung cancer ([place]) dx'd 2017  . Osteoporosis   . Sciatica of left side 06/16/2012  . Spinal stenosis of lumbar region 06/16/2012  . Vitamin B12 deficiency     Patient Active Problem List   Diagnosis Date Noted  . Acute respiratory failure with hypoxia ([place]) 08/30/2019  . Hip fracture ([place]) 08/11/2019  . Congestive heart failure (CHF) ([place]) 07/26/2019  . Chronic obstructive pulmonary disease ([place]) 12/18/2017  . Essential hypertension 12/18/2017  . Major depression 12/18/2017  . Osteoporosis 12/18/2017  . Vitamin B deficiency 12/18/2017  . Essential  thrombocythemia ([place]) 11/18/2016  . 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 Conductive hearing loss in right ear 04/25/2015  . Cerumen impaction 04/25/2015  . Sciatica of left side 06/16/2012  . Spinal stenosis of lumbar region 06/16/2012    Past Surgical History:  Procedure Laterality Date  . APPENDECTOMY    . CATARACT EXTRACTION, BILATERAL Bilateral   . INTRAMEDULLARY (IM) NAIL INTERTROCHANTERIC Left 08/11/2019   Procedure: INTRAMEDULLARY (IM) NAIL INTERTROCHANTRIC;  Surgeon: Hiram Gash, MD;  Location: [facility];  Service: Orthopedics;  Laterality: Left;  . STAPEDECTOMY Right   . TONSILLECTOMY    . VIDEO BRONCHOSCOPY WITH ENDOBRONCHIAL NAVIGATION N/A 02/11/2016   Procedure: VIDEO BRONCHOSCOPY WITH ENDOBRONCHIAL NAVIGATION;  Surgeon: Grace Isaac, MD;  Location: [facility];  Service: Thoracic;  Laterality: N/A;  . VIDEO BRONCHOSCOPY WITH ENDOBRONCHIAL ULTRASOUND N/A 02/11/2016   Procedure: VIDEO BRONCHOSCOPY WITH ENDOBRONCHIAL ULTRASOUND with biopsy of nodes #7 and #10;  Surgeon: Grace Isaac, MD;  Location: [facility];  Service: Thoracic;  Laterality: N/A;     OB History   No obstetric history on file.     Family History  Problem Relation Age of Onset  . Heart attack Father   . Cancer Mother   . Cancer Sister   . Cancer Brother   . Cancer Brother   . Cancer Daughter  Social History   Tobacco Use  . Smoking status: Former Smoker    Packs/day: 1.00    Years: 21.00    Pack years: 21.00    Types: Cigarettes    Quit date: 07/10/1976    Years since quitting: 43.1  . Smokeless tobacco: Never Used  Vaping Use  . Vaping Use: Never used  Substance Use Topics  . Alcohol use: No  . Drug use: No    Home Medications Prior to Admission medications   Medication Sig Start Date End Date Taking? Authorizing Provider  albuterol (PROVENTIL HFA;VENTOLIN HFA) 108 (90 Base) MCG/ACT inhaler Inhale 2 puffs into the lungs every 6 (six) hours as needed for  wheezing or shortness of breath.   Yes [provider]  aspirin 81 MG tablet Take 81 mg by mouth daily.   Yes [provider]  atenolol (TENORMIN) 25 MG tablet Take 25 mg by mouth daily.  07/26/19  Yes [provider]  benzonatate (TESSALON) 100 MG capsule Take 100 mg by mouth 3 (three) times daily as needed for cough.    Yes [provider]  calcium carbonate (OS-CAL - DOSED IN MG OF ELEMENTAL CALCIUM) 1250 MG tablet Take 1 tablet by mouth every evening.    Yes [provider]  cyanocobalamin (,VITAMIN B-12,) 1000 MCG/ML injection Inject 1,000 mcg into the muscle every 30 (thirty) days.   Yes [provider]  docusate sodium (COLACE) 100 MG capsule Take 1 capsule (100 mg total) by mouth 2 (two) times daily. 08/15/19  Yes Lavina Hamman, MD  enoxaparin (LOVENOX) 40 MG/0.4ML injection Inject 0.4 mLs (40 mg total) into the skin daily. For DVT prophylaxis after surgery 08/12/19 09/11/19 Yes McBane, Maylene Roes, PA-C  ferrous sulfate 325 (65 FE) MG tablet Take 1 tablet (325 mg total) by mouth daily with breakfast. 08/16/19 09/15/19 Yes Lavina Hamman, MD  folic acid (FOLVITE) 1 MG tablet Take 1 tablet (1 mg total) by mouth daily. 08/16/19  Yes Lavina Hamman, MD  furosemide (LASIX) 20 MG tablet Take 1 tablet (20 mg total) by mouth every other day. 07/29/19  Yes Nita Sells, MD  hydroxyurea (HYDREA) 500 MG capsule Take 500-1,000 mg by mouth every other day. May take with food to minimize GI side effects. Alternate Take 1 capsule (500 mg) and Take 2 capsules (1000 mg) Other Day   Yes [provider]  Hypromellose (ARTIFICIAL TEARS OP) Place 1 drop into both eyes as needed (for dry eyes).   Yes [provider]  levothyroxine (SYNTHROID) 25 MCG tablet Take 25 mcg by mouth daily. 08/24/18  Yes [provider]  meclizine (ANTIVERT) 25 MG tablet Take 25 mg by mouth 3 (three) times daily as needed for dizziness.   Yes [provider]  Multiple Vitamins-Minerals (PRESERVISION AREDS PO) Take 1 tablet by mouth daily.   Yes [provider]  omeprazole (PRILOSEC) 20 MG capsule Take 20 mg by mouth every morning.  07/11/19  Yes [provider]  oxyCODONE (OXY IR/ROXICODONE) 5 MG immediate release tablet Take 1/2 to 1 pill every 6 hrs as needed for pain 08/12/19  Yes McBane, Maylene Roes, PA-C  polyethylene glycol (MIRALAX / GLYCOLAX) 17 g packet Take 17 g by mouth daily. 08/16/19  Yes Lavina Hamman, MD  sertraline (ZOLOFT) 100 MG tablet Take 50 mg by mouth daily.  07/26/19  Yes [provider]  umeclidinium-vilanterol (ANORO ELLIPTA) 62.5-25 MCG/INH AEPB Inhale 1 puff into the lungs daily. 06/28/19  Yes Mannam, Praveen, MD    Allergies    Penicillins, Codeine, Erythromycin, and Sulfa drugs cross reactors  Review of Systems   Review of Systems  Constitutional: Negative for fever.  Respiratory: Positive for shortness of breath. Negative for cough.   Cardiovascular: Positive for leg swelling. Negative for chest pain.  Gastrointestinal: Negative for abdominal pain, nausea and vomiting.  Genitourinary: Negative for dysuria.  All other systems reviewed and are negative.   Physical Exam Updated Vital Signs BP 96/65   Pulse 83   Temp 98.1 F (36.7 C) (Oral)   Resp (!) 23   Ht 1.626 m (5\' 4" )   Wt 52.2 kg   SpO2 94%   BMI 19.74 kg/m   Physical Exam Vitals and nursing note reviewed.  Constitutional:      Appearance: She is well-developed.     Comments: Elderly, nontoxic-appearing, no acute distress  HENT:     Head: Normocephalic and atraumatic.     Nose: Nose normal.     Mouth/Throat:     Mouth: Mucous membranes are moist.  Eyes:     Pupils: Pupils are equal, round, and reactive to light.  Cardiovascular:     Rate and Rhythm: Normal rate and regular rhythm.     Heart sounds: Normal heart sounds.  Pulmonary:     Effort: Pulmonary effort is normal. No respiratory distress.      Breath sounds: Wheezing and rales present.  Abdominal:     General: Bowel sounds are normal.     Palpations: Abdomen is soft.     Tenderness: There is no abdominal tenderness.  Musculoskeletal:     Cervical back: Neck supple.     Comments: Extensive scarring and bruising noted to the left lower extremity with ecchymotic changes up to the thigh, there is 2+ pitting edema, weeping, significant asymmetry between the legs  Skin:    General: Skin is warm and dry.  Neurological:     Mental Status: She is alert and oriented to person, place, and time.  Psychiatric:        Mood and Affect: Mood normal.     ED Results / Procedures / Treatments   Labs (all labs ordered are listed, but only abnormal results are displayed) Labs Reviewed  SARS CORONAVIRUS 2 BY RT PCR (HOSPITAL ORDER, [facility]) - Abnormal; Notable for the following components:      Result Value   SARS Coronavirus 2 POSITIVE (*)    All other components within normal limits  CBC WITH DIFFERENTIAL/PLATELET - Abnormal; Notable for the following components:   WBC 11.6 (*)    RBC 3.20 (*)    Hemoglobin 11.6 (*)    MCV 114.4 (*)    MCH 36.3 (*)    RDW 18.4 (*)    Platelets 817 (*)    Neutro Abs 9.3 (*)    Lymphs Abs 0.4 (*)    Monocytes Absolute 1.6 (*)    Abs Immature Granulocytes 0.13 (*)    All other components within normal limits  BASIC METABOLIC PANEL - Abnormal; Notable for the following components:   Glucose, Bld 142 (*)    Calcium 8.4 (*)    All other components within normal limits  BRAIN NATRIURETIC PEPTIDE - Abnormal; Notable for the following components:   B Natriuretic Peptide 236.5 (*)    All other components within normal limits    EKG None ED ECG REPORT   Date: 08/30/2019  Rate: 102  Rhythm: normal  sinus rhythm  QRS Axis: normal  Intervals: normal  ST/T Wave abnormalities: normal  Conduction Disutrbances:none  Narrative Interpretation:   Old EKG Reviewed:  unchanged Left atrial enlargement  I have personally reviewed the EKG tracing and agree with the computerized printout as noted.  Radiology CT Angio Chest PE W and/or Wo Contrast  Result Date: 08/30/2019 CLINICAL DATA:  Suspected pulmonary embolus. High probability. Low oxygen saturation. Elevated white cell count. History of hypertension and lung cancer. EXAM: CT ANGIOGRAPHY CHEST WITH CONTRAST TECHNIQUE: Multidetector CT imaging of the chest was performed using the standard protocol during bolus administration of intravenous contrast. Multiplanar CT image reconstructions and MIPs were obtained to evaluate the vascular anatomy. CONTRAST:  66mL OMNIPAQUE IOHEXOL 350 MG/ML SOLN COMPARISON:  07/27/2019 FINDINGS: Cardiovascular: Motion artifact limits the examination. There is good opacification of the central and segmental pulmonary arteries. No focal filling defects. No evidence of significant pulmonary embolus. Cardiac enlargement. Coronary artery and aortic calcifications. No pericardial effusions. Normal caliber aorta. Mediastinum/Nodes: No enlarged mediastinal, hilar, or axillary lymph nodes. Thyroid gland, trachea, and esophagus demonstrate no significant findings. Lungs/Pleura: Small bilateral pleural effusions. Atelectasis in the lung bases. Mosaic pattern to the lungs may be due to air trapping or edema. Interstitial thickening is likely edema. Peribronchial thickening with patent airways. Appearances are progressed since the previous study. Upper Abdomen: No acute abnormality. Musculoskeletal: Degenerative changes in the spine. Midthoracic vertebral compression deformities without change since the prior study. Review of the MIP images confirms the above findings. IMPRESSION: 1. No evidence of significant pulmonary embolus. 2. Cardiac enlargement. Bilateral pleural effusions with interstitial and airspace infiltrates, likely edema. Progression since previous study. 3. Aortic atherosclerosis. Aortic  Atherosclerosis (ICD10-I70.0). Electronically Signed   By: Lucienne Capers M.D.   On: 08/30/2019 05:56   DG Chest Portable 1 View  Result Date: 08/30/2019 CLINICAL DATA:  Shortness of breath EXAM: PORTABLE CHEST 1 VIEW COMPARISON:  08/11/2019 FINDINGS: Cardiac enlargement with central pulmonary vascular congestion. Developing interstitial changes suggesting edema. Scarring and postoperative change in the left mid lung is unchanged. No pleural effusions. Mediastinal contours appear intact. IMPRESSION: Cardiac enlargement with central pulmonary vascular congestion and developing interstitial edema. Stable scarring and postoperative change in the left mid lung related to previous radiation therapy. Electronically Signed   By: Lucienne Capers M.D.   On: 08/30/2019 03:23    Procedures Procedures (including critical care time)  CRITICAL CARE Performed by: Merryl Hacker   Total critical care time: 31 minutes  Critical care time was exclusive of separately billable procedures and treating other patients.  Critical care was necessary to treat or prevent imminent or life-threatening deterioration.  Critical care was time spent personally by me on the following activities: development of treatment plan with patient and/or surrogate as well as nursing, discussions with consultants, evaluation of patient's response to treatment, examination of patient, obtaining history from patient or surrogate, ordering and performing treatments and interventions, ordering and review of laboratory studies, ordering and review of radiographic studies, pulse oximetry and re-evaluation of patient's condition.   Medications Ordered in ED Medications  sodium chloride (PF) 0.9 % injection (has no administration in time range)  furosemide (LASIX) injection 20 mg (20 mg Intravenous Given 08/30/19 0550)  iohexol (OMNIPAQUE) 350 MG/ML injection 100 mL (80 mLs Intravenous Contrast Given 08/30/19 0532)    ED Course  I have  reviewed the triage vital signs and the nursing notes.  Pertinent labs & imaging results that were available during my care of  the patient were reviewed by me and considered in my medical decision making (see chart for details).    MDM Rules/Calculators/A&P                           Patient presents with shortness of breath.  Increased oxygen requirement from baseline.  She is not in any respiratory distress.  She describes increased swelling significantly in the left lower extremity.  O2 sats mid 80s on home O2 requirement.  She is currently on nonrebreather but no respiratory distress.  She does have wheezes and crackles.  She also has a history of COPD and was given steroids in route.  Considerations include, PE given leg swelling, CHF, COPD.  No indication for BiPAP at this time.  I have requested nursing attempt to wean her oxygen.  Chest x-ray shows evidence of pulmonary edema.  She has mild leukocytosis but has not been febrile at home and has been fully vaccinated against COVID-19.  Given unilateral leg swelling and recent surgery, will obtain PE study to rule out pulmonary embolism.  PE study is negative for acute pulmonary embolism but does show worsening changes consistent with pulmonary edema.  Given that she is not at her baseline oxygen requirement and will need diuresis, will admit for diuresis.  Patient was given 20 mg of IV Lasix.  At home she is on 20 mg every other day.  Patient discussed with Dr. Hal Hope.  Final Clinical Impression(s) / ED Diagnoses Final diagnoses:  Acute systolic congestive heart failure [facility])    Rx / DC Orders ED Discharge Orders    None       Devika Dragovich, Barbette Hair, MD 08/30/19 [phone]

## 2019-08-30 NOTE — ED Notes (Signed)
Attempted to update family contact on patient's room assignment, straight to VM.

## 2019-08-30 NOTE — ED Notes (Signed)
Report given to Rachel, RN.

## 2019-08-31 DIAGNOSIS — C3432 Malignant neoplasm of lower lobe, left bronchus or lung: Secondary | ICD-10-CM

## 2019-08-31 DIAGNOSIS — I5033 Acute on chronic diastolic (congestive) heart failure: Secondary | ICD-10-CM

## 2019-08-31 DIAGNOSIS — D473 Essential (hemorrhagic) thrombocythemia: Secondary | ICD-10-CM

## 2019-08-31 DIAGNOSIS — M810 Age-related osteoporosis without current pathological fracture: Secondary | ICD-10-CM

## 2019-08-31 DIAGNOSIS — I10 Essential (primary) hypertension: Secondary | ICD-10-CM

## 2019-08-31 DIAGNOSIS — U071 COVID-19: Principal | ICD-10-CM

## 2019-08-31 DIAGNOSIS — J449 Chronic obstructive pulmonary disease, unspecified: Secondary | ICD-10-CM

## 2019-08-31 LAB — CBC WITH DIFFERENTIAL/PLATELET
Abs Immature Granulocytes: 0.18 10*3/uL — ABNORMAL HIGH (ref 0.00–0.07)
Basophils Absolute: 0 10*3/uL (ref 0.0–0.1)
Basophils Relative: 0 %
Eosinophils Absolute: 0 10*3/uL (ref 0.0–0.5)
Eosinophils Relative: 0 %
HCT: 34.8 % — ABNORMAL LOW (ref 36.0–46.0)
Hemoglobin: 11.4 g/dL — ABNORMAL LOW (ref 12.0–15.0)
Immature Granulocytes: 2 %
Lymphocytes Relative: 7 %
Lymphs Abs: 0.7 10*3/uL (ref 0.7–4.0)
MCH: 37.3 pg — ABNORMAL HIGH (ref 26.0–34.0)
MCHC: 32.8 g/dL (ref 30.0–36.0)
MCV: 113.7 fL — ABNORMAL HIGH (ref 80.0–100.0)
Monocytes Absolute: 2 10*3/uL — ABNORMAL HIGH (ref 0.1–1.0)
Monocytes Relative: 20 %
Neutro Abs: 6.9 10*3/uL (ref 1.7–7.7)
Neutrophils Relative %: 71 %
Platelets: 797 10*3/uL — ABNORMAL HIGH (ref 150–400)
RBC: 3.06 MIL/uL — ABNORMAL LOW (ref 3.87–5.11)
RDW: 18.6 % — ABNORMAL HIGH (ref 11.5–15.5)
WBC: 9.9 10*3/uL (ref 4.0–10.5)
nRBC: 0 % (ref 0.0–0.2)

## 2019-08-31 LAB — D-DIMER, QUANTITATIVE: D-Dimer, Quant: 2.58 ug/mL-FEU — ABNORMAL HIGH (ref 0.00–0.50)

## 2019-08-31 LAB — COMPREHENSIVE METABOLIC PANEL
ALT: 14 U/L (ref 0–44)
AST: 24 U/L (ref 15–41)
Albumin: 2.9 g/dL — ABNORMAL LOW (ref 3.5–5.0)
Alkaline Phosphatase: 208 U/L — ABNORMAL HIGH (ref 38–126)
Anion gap: 12 (ref 5–15)
BUN: 26 mg/dL — ABNORMAL HIGH (ref 8–23)
CO2: 28 mmol/L (ref 22–32)
Calcium: 8.8 mg/dL — ABNORMAL LOW (ref 8.9–10.3)
Chloride: 99 mmol/L (ref 98–111)
Creatinine, Ser: 0.76 mg/dL (ref 0.44–1.00)
GFR calc Af Amer: >60 mL/min (ref >60)
GFR calc non Af Amer: >60 mL/min (ref >60)
Glucose, Bld: 103 mg/dL — ABNORMAL HIGH (ref 70–99)
Potassium: 3.5 mmol/L (ref 3.5–5.1)
Sodium: 139 mmol/L (ref 135–145)
Total Bilirubin: 0.8 mg/dL (ref 0.3–1.2)
Total Protein: 5.7 g/dL — ABNORMAL LOW (ref 6.5–8.1)

## 2019-08-31 LAB — C-REACTIVE PROTEIN: CRP: 1.9 mg/dL — ABNORMAL HIGH (ref <1.0)

## 2019-08-31 NOTE — Plan of Care (Signed)

## 2019-08-31 NOTE — Progress Notes (Addendum)
PROGRESS NOTE  Kathy Pierce  JJK:[id number] DOB: [birth year] DOA: 08/30/2019 PCP: London Pepper, MD   Brief Narrative: Kathy Pierce is a 86 y.o. female with a history of HFpEF, 3L O2-dependent COPD, HTN, lung CA, depression, left leg SCC s/p XRT 2018, B12 deficiency, underweight, hypothyroidism, and recent admission for left hip fracture (discharged to [facility] 7/19) who presented from SNF with shortness of breath requiring 6L O2, also tachypneic.  Wbc at 11.6, slightly positive proBNP 236.5. Chest x-ray showed pulmonary congestion with subsequent CTA chest showing no PE but did have bilateral pleural effusions with interstitial and airspace infiltrates, likely edema. SARS-CoV-2 PCR was positive. The patient was admitted for acute on chronic diastolic CHF and [id number] infection.  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  - Treat conditions as below, wean oxygen to home 3L O2 at tolerated.  - Continue bronchodilators.   Covid-19 infection: SARS-CoV-2 PCR positive on 8/3.  - Continue remdesivir x5 days - Steroids x10 days; decadron 6mg  daily. CRP modestly elevated.  - Continue airborne, contact precautions for 21 days from positive testing. - Check CBC w/diff, CMP, CRP daily - Enoxaparin prophylactic dose. D-dimer trending downward. - Maintain euvolemia/net negative.   Acute on chronic HFpEF:  - Continue IV lasix today. Usually on lasix 20mg  po QOD. SCr up a bit with widening BUN:Cr. Plan to convert to po 8/5. Monitor metabolic panel, daily  weights, I/O.  - Continue atenolol, ASA  Post-operative blood loss anemia: Much improved from prior discharge.  - Continue iron supplement.  Hypothyroidism: TSH 1.9 - Continue low dose synthroid  Underweight: BMI 19.74.   ET:  - Continue hydroxyurea.  Vitamin B12 deficiency:  - Continue monthly B12 injections, recent level 823.  Left hip fracture, osteoporosis: Routine healing postoperatively.  - Continue Vit D, Ca, PT, OT.  Depression: Mood euthymic. - Continue SSRI  GERD:  - Continue PPI  DVT prophylaxis: Lovenox Code Status: DNR Family Communication: None at bedside Disposition Plan:  Status is: Inpatient  Remains inpatient appropriate because:Inpatient level of care appropriate due to severity of illness   Dispo: The patient is from: SNF              Anticipated d/c is to: TBD; PT/OT consulted              Anticipated d/c date is: 2 days              Patient currently is not medically stable to d/c.  Consultants:   None  Procedures:   None  Antimicrobials:  Remdesivir   Subjective: Up to chair with assistance today, eager to get and remain out of bed. Eating ok. Says her breathing is modestly better than previous. No chest pain or palpitations.   Objective: Vitals:   08/30/19 2032 08/31/19 0205 08/31/19 0536 08/31/19 1222  BP: 101/66 104/65 (!) 104/50 106/74  Pulse: (!) 119 90 82 71  Resp: 16 (!) 21  14  Temp: 98 F (36.7 C) 98 F (36.7 C) 98.7 F (37.1 C) 97.9 F (36.6 C)  TempSrc:  SpO2: 99% 97% 98% 97%  Weight:      Height:        Intake/Output Summary (Last 24 hours) at 08/31/2019 1528 Last data filed at 08/31/2019 1230 Gross per 24 hour  Intake 250 ml  Output 1025 ml  Net -775 ml   Filed Weights   08/30/19 0249 08/30/19 1445  Weight: 52.2 kg 53.3 kg    Gen: Pleasant, interactive elderly female in chair Pulm: Non-labored breathing but tachypneic with diminished bases, no wheezes. +crackles.  CV: Regular rate and rhythm. No  murmur, rub, or gallop. No JVD, trace pedal edema. GI: Abdomen soft, non-tender, non-distended, with normoactive bowel sounds. No organomegaly or masses felt. Ext: Warm, no deformities Skin: No rashes, lesions or ulcers. Surgical site c/d/i. Neuro: Alert and oriented. No focal neurological deficits. Psych: Judgement and insight appear normal. Mood & affect appropriate.   Data Reviewed: I have personally reviewed following labs and imaging studies  CBC: Recent Labs  Lab 08/30/19 0310 08/30/19 0845 08/31/19 0406  WBC 11.6* 9.5 9.9  NEUTROABS 9.3*  --  6.9  HGB 11.6* 11.7* 11.4*  HCT 36.6 36.7 34.8*  MCV 114.4* 114.3* 113.7*  PLT 817* 814* 951*   Basic Metabolic Panel: Recent Labs  Lab 08/30/19 0310 08/30/19 0845 08/31/19 0406  NA 137  --  139  K 4.2  --  3.5  CL 101  --  99  CO2 25  --  28  GLUCOSE 142*  --  103*  BUN 17  --  26*  CREATININE 0.48 0.58 0.76  CALCIUM 8.4*  --  8.8*   GFR: Estimated Creatinine Clearance: 38.5 mL/min (by C-G formula based on SCr of 0.76 mg/dL). Liver Function Tests: Recent Labs  Lab 08/31/19 0406  AST 24  ALT 14  ALKPHOS 208*  BILITOT 0.8  PROT 5.7*  ALBUMIN 2.9*   No results for input(s): LIPASE, AMYLASE in the last 168 hours. No results for input(s): AMMONIA in the last 168 hours. Coagulation Profile: No results for input(s): INR, PROTIME in the last 168 hours. Cardiac Enzymes: No results for input(s): CKTOTAL, CKMB, CKMBINDEX, TROPONINI in the last 168 hours. BNP (last 3 results) No results for input(s): PROBNP in the last 8760 hours. HbA1C: No results for input(s): HGBA1C in the last 72 hours. CBG: No results for input(s): GLUCAP in the last 168 hours. Lipid Profile: No results for input(s): CHOL, HDL, LDLCALC, TRIG, CHOLHDL, LDLDIRECT in the last 72 hours. Thyroid Function Tests: No results for input(s): TSH, T4TOTAL, FREET4, T3FREE, THYROIDAB in the last 72 hours. Anemia Panel: No results for input(s): VITAMINB12,  FOLATE, FERRITIN, TIBC, IRON, RETICCTPCT in the last 72 hours. Urine analysis: No results found for: COLORURINE, APPEARANCEUR, LABSPEC, PHURINE, GLUCOSEU, HGBUR, BILIRUBINUR, KETONESUR, PROTEINUR, UROBILINOGEN, NITRITE, LEUKOCYTESUR Recent Results (from the past 240 hour(s))  SARS Coronavirus 2 by RT PCR (hospital order, performed in [facility] hospital lab) Nasopharyngeal Nasopharyngeal Swab     Status: Abnormal   Collection Time: 08/30/19  3:10 AM   Specimen: Nasopharyngeal Swab  Result Value Ref Range Status   SARS Coronavirus 2 POSITIVE (A) NEGATIVE Final    Comment: RESULT CALLED TO, READ BACK BY AND VERIFIED WITH: DOWD,P. RN AT 0708 08/30/19 MULLINS,T (NOTE) SARS-CoV-2 target nucleic acids are DETECTED  SARS-CoV-2 RNA is generally detectable in upper respiratory specimens  during the acute phase of infection.  Positive results are indicative  of the presence of the identified virus, but do not rule out bacterial infection or co-infection  with other pathogens not detected by the test.  Clinical correlation with patient history and  other diagnostic information is necessary to determine patient infection status.  The expected result is negative.  Fact Sheet for Patients:   StrictlyIdeas.no   Fact Sheet for Healthcare Providers:   BankingDealers.co.za    This test is not yet approved or cleared by the [place] FDA and  has been authorized for detection and/or diagnosis of SARS-CoV-2 by FDA under an Emergency Use Authorization (EUA).  This EUA will remain in effect (meaning this  test can be used) for the duration of  the COVID-19 declaration under Section 564(b)(1) of the Act, 21 U.S.C. section 360-bbb-3(b)(1), unless the authorization is terminated or revoked sooner.  Performed at [facility], [place] [address], [place], [place] [address]       Radiology Studies: CT Angio Chest PE W and/or Wo  Contrast  Result Date: 08/30/2019 CLINICAL DATA:  Suspected pulmonary embolus. High probability. Low oxygen saturation. Elevated white cell count. History of hypertension and lung cancer. EXAM: CT ANGIOGRAPHY CHEST WITH CONTRAST TECHNIQUE: Multidetector CT imaging of the chest was performed using the standard protocol during bolus administration of intravenous contrast. Multiplanar CT image reconstructions and MIPs were obtained to evaluate the vascular anatomy. CONTRAST:  57mL OMNIPAQUE IOHEXOL 350 MG/ML SOLN COMPARISON:  07/27/2019 FINDINGS: Cardiovascular: Motion artifact limits the examination. There is good opacification of the central and segmental pulmonary arteries. No focal filling defects. No evidence of significant pulmonary embolus. Cardiac enlargement. Coronary artery and aortic calcifications. No pericardial effusions. Normal caliber aorta. Mediastinum/Nodes: No enlarged mediastinal, hilar, or axillary lymph nodes. Thyroid gland, trachea, and esophagus demonstrate no significant findings. Lungs/Pleura: Small bilateral pleural effusions. Atelectasis in the lung bases. Mosaic pattern to the lungs may be due to air trapping or edema. Interstitial thickening is likely edema. Peribronchial thickening with patent airways. Appearances are progressed since the previous study. Upper Abdomen: No acute abnormality. Musculoskeletal: Degenerative changes in the spine. Midthoracic vertebral compression deformities without change since the prior study. Review of the MIP images confirms the above findings. IMPRESSION: 1. No evidence of significant pulmonary embolus. 2. Cardiac enlargement. Bilateral pleural effusions with interstitial and airspace infiltrates, likely edema. Progression since previous study. 3. Aortic atherosclerosis. Aortic Atherosclerosis (ICD10-I70.0). Electronically Signed   By: Lucienne Capers M.D.   On: 08/30/2019 05:56   DG Chest Portable 1 View  Result Date: 08/30/2019 CLINICAL DATA:   Shortness of breath EXAM: PORTABLE CHEST 1 VIEW COMPARISON:  08/11/2019 FINDINGS: Cardiac enlargement with central pulmonary vascular congestion. Developing interstitial changes suggesting edema. Scarring and postoperative change in the left mid lung is unchanged. No pleural effusions. Mediastinal contours appear intact. IMPRESSION: Cardiac enlargement with central pulmonary vascular congestion and developing interstitial edema. Stable scarring and postoperative change in the left mid lung related to previous radiation therapy. Electronically Signed   By: Lucienne Capers M.D.   On: 08/30/2019 03:23    Scheduled Meds: . albuterol  2 puff Inhalation Q6H  . vitamin C  500 mg Oral Daily  . aspirin EC  81 mg Oral Daily  . atenolol  25 mg Oral Daily  . calcium carbonate  1 tablet Oral QPM  . dexamethasone (DECADRON) injection  6 mg Intravenous Q24H  . docusate sodium  100 mg Oral BID  . enoxaparin (LOVENOX) injection  40 mg Subcutaneous Q24H  . ferrous sulfate  325 mg Oral Q breakfast  . folic acid  1 mg Oral Daily  . furosemide  20 mg Intravenous Daily  . [START ON 09/01/2019] hydroxyurea  1,000 mg Oral QODAY   And  . hydroxyurea  500 mg Oral QODAY  . levothyroxine  25 mcg Oral Daily  . multivitamin   Oral Daily  . pantoprazole  40 mg Oral Daily  . sertraline  50 mg Oral Daily  . umeclidinium-vilanterol  1 puff Inhalation Daily  . zinc sulfate  220 mg Oral Daily   Continuous Infusions: . remdesivir 100 mg in NS 100 mL 100 mg (08/31/19 0930)     LOS: 1 day   Time spent: 25 minutes.  Patrecia Pour, MD [facility] [email or web address] 08/31/2019, 3:28 PM

## 2019-09-01 LAB — CBC WITH DIFFERENTIAL/PLATELET
Abs Immature Granulocytes: 0.13 10*3/uL — ABNORMAL HIGH (ref 0.00–0.07)
Basophils Absolute: 0 10*3/uL (ref 0.0–0.1)
Basophils Relative: 0 %
Eosinophils Absolute: 0 10*3/uL (ref 0.0–0.5)
Eosinophils Relative: 0 %
HCT: 34.9 % — ABNORMAL LOW (ref 36.0–46.0)
Hemoglobin: 11 g/dL — ABNORMAL LOW (ref 12.0–15.0)
Immature Granulocytes: 2 %
Lymphocytes Relative: 8 %
Lymphs Abs: 0.7 10*3/uL (ref 0.7–4.0)
MCH: 36.1 pg — ABNORMAL HIGH (ref 26.0–34.0)
MCHC: 31.5 g/dL (ref 30.0–36.0)
MCV: 114.4 fL — ABNORMAL HIGH (ref 80.0–100.0)
Monocytes Absolute: 1.7 10*3/uL — ABNORMAL HIGH (ref 0.1–1.0)
Monocytes Relative: 19 %
Neutro Abs: 6.2 10*3/uL (ref 1.7–7.7)
Neutrophils Relative %: 71 %
Platelets: 710 10*3/uL — ABNORMAL HIGH (ref 150–400)
RBC: 3.05 MIL/uL — ABNORMAL LOW (ref 3.87–5.11)
RDW: 18.2 % — ABNORMAL HIGH (ref 11.5–15.5)
WBC: 8.8 10*3/uL (ref 4.0–10.5)
nRBC: 0 % (ref 0.0–0.2)

## 2019-09-01 LAB — COMPREHENSIVE METABOLIC PANEL
ALT: 15 U/L (ref 0–44)
AST: 26 U/L (ref 15–41)
Albumin: 2.8 g/dL — ABNORMAL LOW (ref 3.5–5.0)
Alkaline Phosphatase: 193 U/L — ABNORMAL HIGH (ref 38–126)
Anion gap: 10 (ref 5–15)
BUN: 32 mg/dL — ABNORMAL HIGH (ref 8–23)
CO2: 27 mmol/L (ref 22–32)
Calcium: 8.6 mg/dL — ABNORMAL LOW (ref 8.9–10.3)
Chloride: 98 mmol/L (ref 98–111)
Creatinine, Ser: 0.79 mg/dL (ref 0.44–1.00)
GFR calc Af Amer: >60 mL/min (ref >60)
GFR calc non Af Amer: >60 mL/min (ref >60)
Glucose, Bld: 94 mg/dL (ref 70–99)
Potassium: 3.6 mmol/L (ref 3.5–5.1)
Sodium: 135 mmol/L (ref 135–145)
Total Bilirubin: 0.6 mg/dL (ref 0.3–1.2)
Total Protein: 5.5 g/dL — ABNORMAL LOW (ref 6.5–8.1)

## 2019-09-01 LAB — C-REACTIVE PROTEIN: CRP: 1.5 mg/dL — ABNORMAL HIGH (ref <1.0)

## 2019-09-01 LAB — D-DIMER, QUANTITATIVE: D-Dimer, Quant: 3.88 ug/mL-FEU — ABNORMAL HIGH (ref 0.00–0.50)

## 2019-09-01 MED ORDER — LORAZEPAM 2 MG/ML IJ SOLN
0.5000 mg | Freq: Once | INTRAMUSCULAR | Status: AC
  Administered 2019-09-01: 0.5 mg via INTRAVENOUS
  Filled 2019-09-01: qty 1

## 2019-09-01 MED ORDER — FUROSEMIDE 20 MG PO TABS
20.0000 mg | ORAL_TABLET | Freq: Every day | ORAL | Status: DC
  Administered 2019-09-01 – 2019-09-05 (×5): 20 mg via ORAL
  Filled 2019-09-01 (×5): qty 1

## 2019-09-01 NOTE — Plan of Care (Signed)

## 2019-09-01 NOTE — Evaluation (Signed)
Physical Therapy Evaluation Patient Details Name: Kathy Pierce MRN: [id number] DOB: 1925-03-18 Today's Date: 09/01/2019   History of Present Illness  EMREE LOCICERO is a 86 y.o. female with a history of HFpEF, 3L O2-dependent COPD, HTN, lung CA, depression, left leg SCC s/p XRT 2018, B12 deficiency, underweight, hypothyroidism, and recent admission for left hip fracture (discharged to [facility] 7/19) who presented from SNF with shortness of breath requiring 6L O2, also tachypneic, Positive Covid.  Clinical Impression  The patient is very lively, sitting on bed edge upon arrival. Cautioned patient to call for assistance for mobility. Patient recently Terminous from [place] to [facility] for  Rehab. Anticipate return to SNF for  Continued PT. Pt admitted with above diagnosis. Pt currently with functional limitations due to the deficits listed below (see PT Problem List). Pt will benefit from skilled PT to increase their independence and safety with mobility to allow discharge to the venue listed below.    SPO2 on /3 L [place] 95%.      Follow Up Recommendations SNF    Equipment Recommendations  None recommended by PT    Recommendations for Other Services       Precautions / Restrictions Precautions Precautions: Fall Precaution Comments: monitor Sats, O2 dependent at baseline Restrictions Other Position/Activity Restrictions: WBAT      Mobility  Bed Mobility   Bed Mobility: Supine to Sit;Sit to Supine     Supine to sit: Supervision Sit to supine: Min assist   General bed mobility comments: assist LLE onto bed only  Transfers Overall transfer level: Needs assistance Equipment used: Rolling walker (2 wheeled) Transfers: Sit to/from Stand Sit to Stand: Min assist         General transfer comment: steady assist, slow to rise,patient verbally stated where hands go, push from surface  Ambulation/Gait Ambulation/Gait assistance: Min assist Gait Distance (Feet): 20 Feet (then 10' x 2)   Gait  Pattern/deviations: Step-to pattern;Decreased stance time - left;Decreased step length - left Gait velocity: decr   General Gait Details: multi-modal cues for sequence, RW position and step length,  Stairs            Wheelchair Mobility    Modified Rankin (Stroke Patients Only)       Balance Overall balance assessment: Needs assistance Sitting-balance support: Feet supported Sitting balance-Leahy Scale: Good     Standing balance support: Bilateral upper extremity supported;During functional activity Standing balance-Leahy Scale: Poor Standing balance comment: reliant on walker                             Pertinent Vitals/Pain Pain Assessment: Faces Faces Pain Scale: Hurts little more Pain Location: left hip Pain Descriptors / Indicators: Discomfort Pain Intervention(s): Monitored during session;Limited activity within patient's tolerance;Patient requesting pain meds-RN notified    Home Living Family/patient expects to be discharged to:: Skilled nursing facility                 Additional Comments: from SNF, lived alone prior to fracture    Prior Function                 Hand Dominance        Extremity/Trunk Assessment        Lower Extremity Assessment Lower Extremity Assessment: LLE deficits/detail LLE Deficits / Details: AAROM grossly WFL hip and knee, min assist to lift leg  onto bed    Cervical / Trunk Assessment Cervical / Trunk Assessment: Kyphotic  Communication      Cognition Arousal/Alertness: Awake/alert Behavior During Therapy: WFL for tasks assessed/performed;Impulsive Overall Cognitive Status: Within Functional Limits for tasks assessed                                        General Comments      Exercises     Assessment/Plan    PT Assessment Patient needs continued PT services  PT Problem List Decreased strength;Decreased mobility;Decreased activity tolerance;Decreased balance;Decreased  knowledge of use of DME;Decreased skin integrity       PT Treatment Interventions DME instruction;Gait training;Therapeutic activities;Therapeutic exercise;Patient/family education;Balance training;Functional mobility training    PT Goals (Current goals can be found in the Care Plan section)  Acute Rehab PT Goals Patient Stated Goal: go to rehab, they were good PT Goal Formulation: With patient Time For Goal Achievement: 09/15/19 Potential to Achieve Goals: Good    Frequency Min 2X/week   Barriers to discharge        Co-evaluation    yes for patient and therapist safety           AM-PAC PT "6 Clicks" Mobility  Outcome Measure Help needed turning from your back to your side while in a flat bed without using bedrails?: A Little Help needed moving from lying on your back to sitting on the side of a flat bed without using bedrails?: A Little Help needed moving to and from a bed to a chair (including a wheelchair)?: A Little Help needed standing up from a chair using your arms (e.g., wheelchair or bedside chair)?: A Little Help needed to walk in hospital room?: A Little Help needed climbing 3-5 steps with a railing? : A Lot 6 Click Score: 17    End of Session   Activity Tolerance: Patient limited by fatigue Patient left: with call bell/phone within reach;in chair Nurse Communication: Mobility status PT Visit Diagnosis: Unsteadiness on feet (R26.81);Muscle weakness (generalized) (M62.81)    Time: 6945-0388 PT Time Calculation (min) (ACUTE ONLY): 20 min   Charges:   PT Evaluation $PT Eval Low Complexity: [place] [facility] Acute Rehabilitation Services Pager [phone] Office [phone]   Claretha Cooper 09/01/2019, 10:25 AM

## 2019-09-01 NOTE — Progress Notes (Addendum)
PROGRESS NOTE  Kathy Pierce  WCH:[id number] DOB: [birth year] DOA: 08/30/2019 PCP: London Pepper, MD   Brief Narrative: Kathy Pierce is a 86 y.o. female with a history of HFpEF, 3L O2-dependent COPD, HTN, lung CA, depression, left leg SCC s/p XRT 2018, B12 deficiency, underweight, hypothyroidism, and recent admission for left hip fracture (discharged to [facility] 7/19) who presented from SNF with shortness of breath requiring 6L O2, also tachypneic.  Wbc at 11.6, slightly positive proBNP 236.5. Chest x-ray showed pulmonary congestion with subsequent CTA chest showing no PE but did have bilateral pleural effusions with interstitial and airspace infiltrates, likely edema. SARS-CoV-2 PCR was positive. The patient was admitted for acute on chronic diastolic CHF and PNTIR-44 infection.  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  - Treat conditions as below, wean oxygen to home 3L O2 at tolerated.  - Continue bronchodilators.   Covid-19 infection: SARS-CoV-2 PCR positive on 8/3.  - Continue remdesivir x5 days (completes 8/7) - Steroids x10 days; decadron 6mg  daily. CRP remains modestly elevated.  - Continue airborne, contact precautions for 21 days from positive testing. - Check CBC w/diff, CMP, CRP daily - Enoxaparin prophylactic dose. CTA showed no PE. Will continue trending d-dimer, augment anticoagulation if continues upward trend. - Maintain euvolemia/net negative.   Acute on chronic HFpEF: Last echo showed LVEF 60-65%, G2DD, moderately elevated  PASP. - Continue daily lasix, transition to PO as BUN:Cr widening and rising. Usually on lasix 20mg  po QOD.  - Monitor metabolic panel, daily weights, I/O.  - Continue atenolol, ASA  Post-operative blood loss anemia: Much improved from prior discharge.  - Continue iron supplement.  Hypothyroidism: TSH 1.9 - Continue low dose synthroid  Underweight: BMI 19.74.   ET:  - Continue hydroxyurea.  Vitamin B12 deficiency:  - Continue monthly B12 injections, recent level 823.  Left hip fracture, osteoporosis: Routine healing postoperatively.  - Continue Vit D, Ca.  - PT/OT consulted to assist with disposition planning  Depression: Mood euthymic. - Continue SSRI  GERD:  - Continue PPI  DVT prophylaxis: Lovenox Code Status: DNR Family Communication: None at bedside; Called daughter, went to voicemail. Disposition Plan:  Status is: Inpatient  Remains inpatient appropriate because:Inpatient level of care appropriate due to severity of illness.   Dispo:  The patient is from: SNF              Anticipated d/c is to: TBD; PT/OT consulted              Anticipated d/c date is: 1 -  2 days               Patient currently is not medically stable to d/c.  Consultants:   None  Procedures:   None  Antimicrobials:  Remdesivir 8/3 - 8/7  Subjective: Says she didn't sleep well last night, eager to participate in PT. Denies shortness of breath. Left hip pain is back though, no injuries.  Objective: Vitals:   08/31/19 1957 09/01/19 0223 09/01/19 0440 09/01/19 1228  BP: 100/60 (!) 111/57 103/62 (!) 108/54  Pulse:  83 78 76 70  Resp: 20 20 19    Temp: 98 F (36.7 C) 98.1 F (36.7 C) 98.4 F (36.9 C) 97.6 F (36.4 C)  TempSrc: Oral Oral Oral Axillary  SpO2: 96% 97% 97% 96%  Weight:   52.8 kg   Height:        Intake/Output Summary (Last 24 hours) at 09/01/2019 1327 Last data filed at 08/31/2019 2000 Gross per 24 hour  Intake 86.99 ml  Output 450 ml  Net -363.01 ml   Filed  Weights   08/30/19 0249 08/30/19 1445 09/01/19 0440  Weight: 52.2 kg 53.3 kg 52.8 kg   Gen: Elderly female in no distress Pulm: Nonlabored breathing 3L O2. Crackles remain. CV: Regular rate and rhythm. No murmur, rub, or gallop. No JVD, trace dependent edema. GI: Abdomen soft, non-tender, non-distended, with normoactive bowel sounds.  Ext: Warm, no deformities. Left lateral surgical site without discharge or erythema or fluctuance. Distal bruising is stable.  Skin: No new rashes, lesions or ulcers on visualized skin. Neuro: Alert and oriented. No focal neurological deficits. Psych: Judgement and insight appear fair. Mood euthymic & affect congruent. Behavior is appropriate.     Data Reviewed: I have personally reviewed following labs and imaging studies  CBC: Recent Labs  Lab 08/30/19 0310 08/30/19 0845 08/31/19 0406 09/01/19 0358  WBC 11.6* 9.5 9.9 8.8  NEUTROABS 9.3*  --  6.9 6.2  HGB 11.6* 11.7* 11.4* 11.0*  HCT 36.6 36.7 34.8* 34.9*  MCV 114.4* 114.3* 113.7* 114.4*  PLT 817* 814* 797* 810*   Basic Metabolic Panel: Recent Labs  Lab 08/30/19 0310 08/30/19 0845 08/31/19 0406 09/01/19 0358  NA 137  --  139 135  K 4.2  --  3.5 3.6  CL 101  --  99 98  CO2 25  --  28 27  GLUCOSE 142*  --  103* 94  BUN 17  --  26* 32*  CREATININE 0.48 0.58 0.76 0.79  CALCIUM 8.4*  --  8.8* 8.6*   GFR: Estimated Creatinine Clearance: 38.2 mL/min (by C-G formula based on SCr of 0.79 mg/dL). Liver Function Tests: Recent Labs  Lab 08/31/19 0406 09/01/19 0358  AST 24 26  ALT 14 15  ALKPHOS 208* 193*  BILITOT 0.8 0.6  PROT 5.7* 5.5*  ALBUMIN 2.9* 2.8*   No results for input(s): LIPASE, AMYLASE in the last 168 hours. No results for input(s): AMMONIA in the last 168 hours. Coagulation Profile: No results for input(s): INR, PROTIME in the last 168 hours. Cardiac Enzymes: No results for input(s): CKTOTAL, CKMB, CKMBINDEX, TROPONINI in the last 168 hours. BNP (last 3 results) No  results for input(s): PROBNP in the last 8760 hours. HbA1C: No results for input(s): HGBA1C in the last 72 hours. CBG: No results for input(s): GLUCAP in the last 168 hours. Lipid Profile: No results for input(s): CHOL, HDL, LDLCALC, TRIG, CHOLHDL, LDLDIRECT in the last 72 hours. Thyroid Function Tests: No results for input(s): TSH, T4TOTAL, FREET4, T3FREE, THYROIDAB in the last 72 hours. Anemia Panel: No results for input(s): VITAMINB12, FOLATE, FERRITIN, TIBC, IRON, RETICCTPCT in the last 72 hours. Urine analysis: No results found for: COLORURINE, APPEARANCEUR, LABSPEC, PHURINE, GLUCOSEU, HGBUR, BILIRUBINUR, KETONESUR, PROTEINUR, UROBILINOGEN, NITRITE, LEUKOCYTESUR Recent Results (from the past 240 hour(s))  SARS Coronavirus 2 by RT PCR (hospital order, performed in [facility] hospital lab) Nasopharyngeal Nasopharyngeal Swab     Status: Abnormal   Collection Time: 08/30/19  3:10 AM   Specimen: Nasopharyngeal Swab  Result Value  Ref Range Status   SARS Coronavirus 2 POSITIVE (A) NEGATIVE Final    Comment: RESULT CALLED TO, READ BACK BY AND VERIFIED WITH: DOWD,P. RN AT 0708 08/30/19 MULLINS,T (NOTE) SARS-CoV-2 target nucleic acids are DETECTED  SARS-CoV-2 RNA is generally detectable in upper respiratory specimens  during the acute phase of infection.  Positive results are indicative  of the presence of the identified virus, but do not rule out bacterial infection or co-infection with other pathogens not detected by the test.  Clinical correlation with patient history and  other diagnostic information is necessary to determine patient infection status.  The expected result is negative.  Fact Sheet for Patients:   StrictlyIdeas.no   Fact Sheet for Healthcare Providers:   BankingDealers.co.za    This test is not yet approved or cleared by the [place] FDA and  has been authorized for detection and/or diagnosis of SARS-CoV-2  by FDA under an Emergency Use Authorization (EUA).  This EUA will remain in effect (meaning this  test can be used) for the duration of  the COVID-19 declaration under Section 564(b)(1) of the Act, 21 U.S.C. section 360-bbb-3(b)(1), unless the authorization is terminated or revoked sooner.  Performed at [facility], [place] [address] [place], [place] [address]       Radiology Studies: No results found.  Scheduled Meds: . albuterol  2 puff Inhalation Q6H  . vitamin C  500 mg Oral Daily  . aspirin EC  81 mg Oral Daily  . atenolol  25 mg Oral Daily  . calcium carbonate  1 tablet Oral QPM  . dexamethasone (DECADRON) injection  6 mg Intravenous Q24H  . docusate sodium  100 mg Oral BID  . enoxaparin (LOVENOX) injection  40 mg Subcutaneous Q24H  . ferrous sulfate  325 mg Oral Q breakfast  . folic acid  1 mg Oral Daily  . furosemide  20 mg Oral Daily  . hydroxyurea  1,000 mg Oral QODAY   And  . hydroxyurea  500 mg Oral QODAY  . levothyroxine  25 mcg Oral Daily  . multivitamin   Oral Daily  . pantoprazole  40 mg Oral Daily  . sertraline  50 mg Oral Daily  . umeclidinium-vilanterol  1 puff Inhalation Daily  . zinc sulfate  220 mg Oral Daily   Continuous Infusions: . remdesivir 100 mg in NS 100 mL 100 mg (09/01/19 0809)     LOS: 2 days   Time spent: 25 minutes.  Patrecia Pour, MD [facility] [email or web address] 09/01/2019, 1:27 PM

## 2019-09-01 NOTE — Progress Notes (Signed)
Occupational Therapy Evaluation  Patient with functional deficits listed below impacting safety and independence with self care. Patient familiar to this therapist from hospitalization in July with hip fracture + repair. Patient reports she enjoyed the therapy at her rehab facility "they do a good job." Patient min A with standing from EOB and ambulating few feet to bedside commode using rolling walker with patient demonstrating understanding of safe hand placement during transfers. Patient maintain >90% on 3L O2 throughout session. As patient lives alone and still requiring assistance for functional transfers and self care recommend return to rehab at discharge from acute care. Will continue to follow.     09/01/19 1400  OT Visit Information  Last OT Received On 09/01/19  Assistance Needed +1  PT/OT/SLP Co-Evaluation/Treatment Yes  Reason for Co-Treatment To address functional/ADL transfers  OT goals addressed during session ADL's and self-care  History of Present Illness Kathy Pierce is a 86 y.o. female with a history of HFpEF, 3L O2-dependent COPD, HTN, lung CA, depression, left leg SCC s/p XRT 2018, B12 deficiency, underweight, hypothyroidism, and recent admission for left hip fracture (discharged to [facility] 7/19) who presented from SNF with shortness of breath requiring 6L O2, also tachypneic, Positive Covid.  Precautions  Precautions Fall  Precaution Comments monitor Sats, O2 dependent at baseline  Restrictions  Weight Bearing Restrictions Yes  LLE Weight Bearing WBAT  Home Living  Family/patient expects to be discharged to: [place] Alone  Available Help at Discharge Family;Friend(s)  Type of [place] Access Level entry  Home Layout One level  Bathroom Shower/Tub Tub/shower unit  So-Hi - single point;Grab bars - tub/shower;Shower seat;Walker - 2 wheels  Additional Comments from SNF, lived  alone prior to fracture  Prior Function  Level of Independence Independent  Gait / Transfers Assistance Needed ambulates with a cane  ADL's / [place] cleaning person 1x/week, otherwise I with self care  Comments still drives  Communication  Communication No difficulties  Pain Assessment  Pain Assessment Faces  Faces Pain Scale 4  Pain Location left hip  Pain Descriptors / Indicators Discomfort  Pain Intervention(s) Patient requesting pain meds-RN notified  Cognition  Arousal/Alertness Awake/alert  Behavior During Therapy WFL for tasks assessed/performed;Impulsive  Overall Cognitive Status No family/caregiver present to determine baseline cognitive functioning  General Comments patient follows directions appropriately, however at end of session reminded patient to use call light to get up patient states "I never get up by myself" however nursing staff report patient got up from chair herself and patient was trying to sit up EOB as therapy arriving to room  Upper Extremity Assessment  Upper Extremity Assessment Generalized weakness  Lower Extremity Assessment  Lower Extremity Assessment Defer to PT evaluation  ADL  Overall ADL's  Needs assistance/impaired  Grooming Set up;Sitting  Upper Body Bathing Set up;Sitting  Lower Body Bathing Minimal assistance;Sitting/lateral leans;Sit to/from stand  Upper Body Dressing  Set up;Sitting  Lower Body Dressing Minimal assistance;Sitting/lateral leans;Sit to/from stand  Toilet Transfer Minimal assistance;Cueing for safety;Ambulation;BSC;RW  Toileting- Clothing Manipulation and Hygiene Set up;Sitting/lateral lean  Toileting - Clothing Manipulation Details (indicate cue type and reason) peri care after voiding  Functional mobility during ADLs Minimal assistance;Rolling walker;Cueing for safety  Bed Mobility  Overal bed mobility Needs Assistance  Bed Mobility Supine to Sit;Sit to Supine  Supine to sit Supervision  Sit to  supine Min assist  General bed mobility comments assist  L LE onto bed  Transfers  Overall transfer level Needs assistance  Equipment used Rolling walker (2 wheeled)  Transfers Sit to/from Stand  Sit to Stand Min assist  General transfer comment increased time to power up to standing   Balance  Overall balance assessment Needs assistance  Sitting-balance support Feet supported  Sitting balance-Leahy Scale Good  Standing balance support Bilateral upper extremity supported;During functional activity  Standing balance-Leahy Scale Poor  General Comments  General comments (skin integrity, edema, etc.) patient O2 saturation maintained >90% on 3L O2 during session  OT - End of Session  Equipment Utilized During Treatment Rolling walker;Oxygen  Activity Tolerance Patient tolerated treatment well  Patient left in bed;with call bell/phone within reach;with bed alarm set  Nurse Communication Patient requests pain meds  OT Assessment  OT Recommendation/Assessment Patient needs continued OT Services  OT Visit Diagnosis Unsteadiness on feet (R26.81);Other abnormalities of gait and mobility (R26.89);History of falling (Z91.81);Pain  Pain - Right/Left Left  Pain - part of body Hip;Leg  OT Problem List Decreased strength;Decreased activity tolerance;Impaired balance (sitting and/or standing);Decreased safety awareness;Decreased knowledge of use of DME or AE;Pain  OT Plan  OT Frequency (ACUTE ONLY) Min 2X/week  OT Treatment/Interventions (ACUTE ONLY) Self-care/ADL training;Therapeutic exercise;DME and/or AE instruction;Therapeutic activities;Patient/family education;Balance training  AM-PAC OT "6 Clicks" Daily Activity Outcome Measure (Version 2)  Help from another person eating meals? 4  Help from another person taking care of personal grooming? 3  Help from another person toileting, which includes using toliet, bedpan, or urinal? 3  Help from another person bathing (including washing, rinsing,  drying)? 3  Help from another person to put on and taking off regular upper body clothing? 3  Help from another person to put on and taking off regular lower body clothing? 3  6 Click Score 19  OT Recommendation  Follow Up Recommendations SNF;Supervision/Assistance - 24 hour  OT Equipment None recommended by OT  Individuals Consulted  Consulted and Agree with Results and Recommendations Patient  Acute Rehab OT Goals  Patient Stated Goal go to rehab, they were good  OT Goal Formulation With patient  Time For Goal Achievement 09/15/19  Potential to Achieve Goals Good  OT Time Calculation  OT Start Time (ACUTE ONLY) 0932  OT Stop Time (ACUTE ONLY) 0955  OT Time Calculation (min) 23 min  OT General Charges  $OT Visit 1 Visit  OT Evaluation  $OT Eval Low Complexity 1 Low  Written Expression  Dominant Hand Right   Delbert Phenix OT OT pager: [phone]

## 2019-09-02 LAB — CBC WITH DIFFERENTIAL/PLATELET
Abs Immature Granulocytes: 0.09 10*3/uL — ABNORMAL HIGH (ref 0.00–0.07)
Basophils Absolute: 0 10*3/uL (ref 0.0–0.1)
Basophils Relative: 0 %
Eosinophils Absolute: 0.1 10*3/uL (ref 0.0–0.5)
Eosinophils Relative: 1 %
HCT: 33 % — ABNORMAL LOW (ref 36.0–46.0)
Hemoglobin: 10.9 g/dL — ABNORMAL LOW (ref 12.0–15.0)
Immature Granulocytes: 1 %
Lymphocytes Relative: 9 %
Lymphs Abs: 0.7 10*3/uL (ref 0.7–4.0)
MCH: 36.9 pg — ABNORMAL HIGH (ref 26.0–34.0)
MCHC: 33 g/dL (ref 30.0–36.0)
MCV: 111.9 fL — ABNORMAL HIGH (ref 80.0–100.0)
Monocytes Absolute: 1.4 10*3/uL — ABNORMAL HIGH (ref 0.1–1.0)
Monocytes Relative: 16 %
Neutro Abs: 6 10*3/uL (ref 1.7–7.7)
Neutrophils Relative %: 73 %
Platelets: 740 10*3/uL — ABNORMAL HIGH (ref 150–400)
RBC: 2.95 MIL/uL — ABNORMAL LOW (ref 3.87–5.11)
RDW: 17.8 % — ABNORMAL HIGH (ref 11.5–15.5)
WBC: 8.3 10*3/uL (ref 4.0–10.5)
nRBC: 0 % (ref 0.0–0.2)

## 2019-09-02 LAB — COMPREHENSIVE METABOLIC PANEL
ALT: 15 U/L (ref 0–44)
AST: 20 U/L (ref 15–41)
Albumin: 2.8 g/dL — ABNORMAL LOW (ref 3.5–5.0)
Alkaline Phosphatase: 194 U/L — ABNORMAL HIGH (ref 38–126)
Anion gap: 10 (ref 5–15)
BUN: 27 mg/dL — ABNORMAL HIGH (ref 8–23)
CO2: 30 mmol/L (ref 22–32)
Calcium: 8.5 mg/dL — ABNORMAL LOW (ref 8.9–10.3)
Chloride: 100 mmol/L (ref 98–111)
Creatinine, Ser: 0.58 mg/dL (ref 0.44–1.00)
GFR calc Af Amer: >60 mL/min (ref >60)
GFR calc non Af Amer: >60 mL/min (ref >60)
Glucose, Bld: 86 mg/dL (ref 70–99)
Potassium: 3.3 mmol/L — ABNORMAL LOW (ref 3.5–5.1)
Sodium: 140 mmol/L (ref 135–145)
Total Bilirubin: 0.6 mg/dL (ref 0.3–1.2)
Total Protein: 5.2 g/dL — ABNORMAL LOW (ref 6.5–8.1)

## 2019-09-02 LAB — C-REACTIVE PROTEIN: CRP: 1.2 mg/dL — ABNORMAL HIGH (ref <1.0)

## 2019-09-02 LAB — D-DIMER, QUANTITATIVE: D-Dimer, Quant: 2.7 ug/mL-FEU — ABNORMAL HIGH (ref 0.00–0.50)

## 2019-09-02 MED ORDER — DEXAMETHASONE 6 MG PO TABS
6.0000 mg | ORAL_TABLET | Freq: Every day | ORAL | 0 refills | Status: AC
Start: 2019-09-02 — End: 2019-09-08

## 2019-09-02 MED ORDER — POTASSIUM CHLORIDE CRYS ER 20 MEQ PO TBCR
40.0000 meq | EXTENDED_RELEASE_TABLET | Freq: Once | ORAL | Status: AC
  Administered 2019-09-02: 40 meq via ORAL
  Filled 2019-09-02: qty 2

## 2019-09-02 MED ORDER — FUROSEMIDE 20 MG PO TABS: 20.0000 mg | ORAL_TABLET | Freq: Every day | ORAL | 0 refills | Status: AC

## 2019-09-02 MED ORDER — DEXAMETHASONE 6 MG PO TABS
6.0000 mg | ORAL_TABLET | Freq: Every day | ORAL | 0 refills | Status: DC
Start: 2019-09-02 — End: 2019-09-02

## 2019-09-02 NOTE — TOC Progression Note (Signed)
Transition of Care [facility]) - Progression Note    Patient Details  Name: JONESSA TRIPLETT MRN: [id number] Date of Birth: 1925/12/16  Transition of Care [facility] CM/SW Contact  Ross Ludwig, [place] Phone Number: 09/02/2019, 10:26 AM  Clinical Narrative:     CSW spoke to patient's daughter [name] [phone] who stated patient is on 3L of oxygen as her base line.  CSW also was informed by patient's daughter that they would like to have patient go home with home health.  Patient's daughter [name] stated that they have home health set up through [facility], and patient was supposed to discharge on Tuesday, but ended up being readmitted to hospital from SNF.  CSW discussed if patient's daughter wanted her to go to a different SNF or have her go home with home health.  Per Patient's daughter, she would prefer home health.  CSW contacted [facility] to confirm they can accept patient and they said yes.  CSW to continue to follow patient's progress throughout discharge planning.      Expected Discharge Plan and [place] with home health services.       Expected Discharge Date: 09/02/19                                     Social Determinants of Health (SDOH) Interventions    Readmission Risk Interventions Readmission Risk Prevention Plan 07/29/2019  Transportation Screening Complete  Home Care Screening Complete  Medication Review (RN CM) Complete  Some recent data might be hidden

## 2019-09-02 NOTE — NC FL2 (Signed)
[place] LEVEL OF CARE SCREENING TOOL     IDENTIFICATION  Patient Name: Kathy Pierce Birthdate: 04/09/1925 Sex: female Admission Date (Current Location): 08/30/2019  [facility] and [place] Number:  Herbalist and Address:  [facility],  [place], [place]      Provider Number: [id number]  Attending Physician Name and Address:  Patrecia Pour, MD  Relative Name and Phone Number:  Yolande Jolly Daughter [phone]    Current Level of Care: Hospital Recommended Level of Care: [place] Prior Approval Number:    Date Approved/Denied:   PASRR Number: [id number]  Discharge Plan: SNF    Current Diagnoses: Patient Active Problem List   Diagnosis Date Noted  . Acute respiratory failure with hypoxia ([place]) 08/30/2019  . COVID-19 virus infection 08/30/2019  . Hip fracture ([place]) 08/11/2019  . Congestive heart failure (CHF) ([place]) 07/26/2019  . Chronic obstructive pulmonary disease ([place]) 12/18/2017  . Essential hypertension 12/18/2017  . Major depression 12/18/2017  . Osteoporosis 12/18/2017  . Vitamin B deficiency 12/18/2017  . Essential thrombocythemia ([place]) 11/18/2016  . 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 Conductive hearing loss in right ear 04/25/2015  . Cerumen impaction 04/25/2015  . Sciatica of left side 06/16/2012  . Spinal stenosis of lumbar region 06/16/2012    Orientation RESPIRATION BLADDER Height & Weight     Self, Time, Situation, Place  O2 (3L) Continent Weight: 114 lb 10.2 oz (52 kg) (bedscale) Height:  5\' 4"  (162.6 cm)  BEHAVIORAL SYMPTOMS/MOOD NEUROLOGICAL BOWEL NUTRITION STATUS      Continent Diet (Regular diet)  AMBULATORY STATUS COMMUNICATION OF NEEDS Skin   Limited Assist Verbally Surgical wounds                       Personal Care Assistance Level of Assistance  Bathing, Feeding, Dressing Bathing Assistance: Limited assistance Feeding assistance:  Independent Dressing Assistance: Limited assistance     Functional Limitations Info  Sight, Speech, Hearing Sight Info: Adequate Hearing Info: Adequate Speech Info: Adequate    SPECIAL CARE FACTORS FREQUENCY  PT (By licensed PT), OT (By licensed OT)     PT Frequency: Minimum 5x a week OT Frequency: Minimum 5x a week            Contractures Contractures Info: Not present    Additional Factors Info  Code Status, Allergies, Psychotropic, Isolation Precautions Code Status Info: DNR Allergies Info: Penicillins Codeine Erythromycin Sulfa Drugs Cross Reactors Psychotropic Info: sertraline (ZOLOFT) tablet 50 mg   Isolation Precautions Info: Air/Con precautions     Current Medications (09/02/2019):  This is the current hospital active medication list Current Facility-Administered Medications  Medication Dose Route Frequency Provider Last Rate Last Admin  . acetaminophen (TYLENOL) tablet 650 mg  650 mg Oral Q6H PRN Kc, Ramesh, MD      . albuterol (VENTOLIN HFA) 108 (90 Base) MCG/ACT inhaler 2 puff  2 puff Inhalation Q6H Antonieta Pert, MD   2 puff at 09/02/19 0834  . albuterol (VENTOLIN HFA) 108 (90 Base) MCG/ACT inhaler 2 puff  2 puff Inhalation Q6H PRN Antonieta Pert, MD   2 puff at 08/30/19 0858  . ascorbic acid (VITAMIN C) tablet 500 mg  500 mg Oral Daily Kc, Ramesh, MD   500 mg at 09/02/19 0829  . aspirin EC tablet 81 mg  81 mg Oral Daily Kc, Ramesh, MD   81 mg at 09/02/19 0830  .  atenolol (TENORMIN) tablet 25 mg  25 mg Oral Daily Kc, Ramesh, MD   25 mg at 09/02/19 0830  . benzonatate (TESSALON) capsule 100 mg  100 mg Oral TID PRN Antonieta Pert, MD   100 mg at 09/02/19 0829  . calcium carbonate (OS-CAL - dosed in mg of elemental calcium) tablet 500 mg of elemental calcium  1 tablet Oral QPM Kc, Ramesh, MD   500 mg of elemental calcium at 09/01/19 1724  . dexamethasone (DECADRON) injection 6 mg  6 mg Intravenous Q24H Kc, Ramesh, MD   6 mg at 09/02/19 0833  . docusate sodium (COLACE) capsule  100 mg  100 mg Oral BID Antonieta Pert, MD   100 mg at 09/02/19 0832  . enoxaparin (LOVENOX) injection 40 mg  40 mg Subcutaneous Q24H Kc, Ramesh, MD   40 mg at 09/02/19 0833  . ferrous sulfate tablet 325 mg  325 mg Oral Q breakfast Kc, Ramesh, MD   325 mg at 09/02/19 0832  . folic acid (FOLVITE) tablet 1 mg  1 mg Oral Daily Kc, Ramesh, MD   1 mg at 09/02/19 0832  . furosemide (LASIX) tablet 20 mg  20 mg Oral Daily Patrecia Pour, MD   20 mg at 09/02/19 0830  . guaiFENesin-dextromethorphan (ROBITUSSIN DM) 100-10 MG/5ML syrup 10 mL  10 mL Oral Q4H PRN Kc, Ramesh, MD   10 mL at 09/02/19 0833  . hydroxyurea (HYDREA) capsule 1,000 mg  1,000 mg Oral QODAY Kc, Ramesh, MD   1,000 mg at 09/01/19 1100   And  . hydroxyurea (HYDREA) capsule 500 mg  500 mg Oral Edwena Felty, MD   500 mg at 09/02/19 1002  . levothyroxine (SYNTHROID) tablet 25 mcg  25 mcg Oral Daily Antonieta Pert, MD   25 mcg at 09/02/19 0559  . meclizine (ANTIVERT) tablet 25 mg  25 mg Oral TID PRN Antonieta Pert, MD      . melatonin tablet 6 mg  6 mg Oral QHS PRN Lang Snow, FNP   6 mg at 09/01/19 2116  . multivitamin (PROSIGHT) tablet   Oral Daily Antonieta Pert, MD   1 tablet at 09/02/19 0829  . ondansetron (ZOFRAN) tablet 4 mg  4 mg Oral Q6H PRN Kc, Ramesh, MD       Or  . ondansetron (ZOFRAN) injection 4 mg  4 mg Intravenous Q6H PRN Kc, Ramesh, MD      . oxyCODONE (Oxy IR/ROXICODONE) immediate release tablet 5 mg  5 mg Oral Q6H PRN Antonieta Pert, MD   5 mg at 08/30/19 0911  . pantoprazole (PROTONIX) EC tablet 40 mg  40 mg Oral Daily Kc, Ramesh, MD   40 mg at 09/02/19 0831  . polyethylene glycol (MIRALAX / GLYCOLAX) packet 17 g  17 g Oral Daily PRN Kc, Ramesh, MD      . polyvinyl alcohol (LIQUIFILM TEARS) 1.4 % ophthalmic solution 1 drop  1 drop Both Eyes PRN Kc, Ramesh, MD      . remdesivir 100 mg in sodium chloride 0.9 % 100 mL IVPB  100 mg Intravenous Daily Rise Patience, MD 200 mL/hr at 09/02/19 0838 100 mg at 09/02/19 0838  . sertraline  (ZOLOFT) tablet 50 mg  50 mg Oral Daily Kc, Ramesh, MD   50 mg at 09/02/19 0832  . umeclidinium-vilanterol (ANORO ELLIPTA) 62.5-25 MCG/INH 1 puff  1 puff Inhalation Daily Antonieta Pert, MD   1 puff at 09/02/19 0835  . zinc sulfate capsule 220 mg  220  mg Oral Daily Antonieta Pert, MD   220 mg at 09/02/19 1443     Discharge Medications: Please see discharge summary for a list of discharge medications.  Relevant Imaging Results:  Relevant Lab Results:   Additional Information COVID + SSN [id number]  Ross Ludwig, LCSW

## 2019-09-02 NOTE — Progress Notes (Signed)
PROGRESS NOTE  Kathy Pierce  GYI:[id number] DOB: 05-23-1925 DOA: 08/30/2019 PCP: London Pepper, MD   Brief Narrative: Kathy Pierce is a 86 y.o. female with a history of HFpEF, 3L O2-dependent COPD, HTN, lung CA, depression, left leg SCC s/p XRT 2018, B12 deficiency, underweight, hypothyroidism, and recent admission for left hip fracture (discharged to [facility] 7/19) who presented from SNF with shortness of breath requiring 6L O2, also tachypneic.  Wbc at 11.6, slightly positive proBNP 236.5. Chest x-ray showed pulmonary congestion with subsequent CTA chest showing no PE but did have bilateral pleural effusions with interstitial and airspace infiltrates, likely edema. SARS-CoV-2 PCR was positive. The patient was admitted for acute on chronic diastolic CHF and [id number] infection. With treatment, hypoxia, work of breathing, and inflammatory marker elevations improved. She is felt to be stable for discharge to SNF. The patient strongly feels she will be able to be safe and rehabilitate at home, though after protracted discussions among the medical team, the patient, social work, and the patient's daughter, she has decided to go to SNF as recommended by PT. This is being pursued.  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  - Treat conditions as below. Weaned to putative baseline of 3L O2. Actually had resolution of hypoxia even at 1L O2, but increased back to 3L due to subjective dyspnea.  - Continue bronchodilators scheduled and prn. Steroids as  below.  Covid-19 infection: SARS-CoV-2 PCR positive on 8/3.  - Continue remdesivir x5 days (completes 8/7).  - Steroids x10 days; decadron 6mg  daily. CRP continues to improve. - Continue airborne, contact precautions for 21 days from positive testing. - Enoxaparin prophylactic dose while admitted. CTA showed no PE, d-dimer trending downward.  - Maintain euvolemia/net negative.   Acute on chronic HFpEF: Last echo showed LVEF 60-65%, G2DD, moderately elevated PASP. - Continue daily lasix 20mg  po (previously on lasix 20mg  po QOD). BUN:Cr stable.  - Monitor metabolic panel, daily weights, I/O.  - Continue atenolol, ASA  Post-operative blood loss anemia: Much improved from prior discharge.  - Continue iron supplement.  Hypothyroidism: TSH 1.9 - Continue low dose synthroid  Underweight: BMI 19.74.   ET:  - Continue hydroxyurea.  Vitamin B12 deficiency:  - Continue monthly B12 injections, recent level 823.  Left hip fracture, osteoporosis: Routine healing postoperatively.  - Continue Vit D, Ca.  - PT/OT consulted to assist with disposition planning  Depression: Mood euthymic. - Continue SSRI  GERD:  - Continue PPI  DVT prophylaxis: Lovenox Code Status: DNR Family Communication: None at bedside; Speaking with daughter daily, including this morning. Disposition Plan:  Status is: Inpatient  Remains inpatient appropriate because:Inpatient level of care appropriate due to severity of illness.   Dispo:  The patient is from: SNF              Anticipated d/c is to: SNF; pt was discharged home per her wishes earlier today but has reconsidered after discussion with her daughter.              Anticipated d/c date is: 1 day  Patient currently is medically stable to d/c.  Consultants:   None  Procedures:   None  Antimicrobials:  Remdesivir 8/3 - 8/7  Subjective: Slept better, clear-headed. Says she woke up short of breath though SpO2 ~95% on 1L O2. Wants to  stay at 3L O2 regardless of oximetry readings. Has been wheezing a bit more today, no chest pain.   Objective: Vitals:   09/01/19 2048 09/02/19 0458 09/02/19 0500 09/02/19 1253  BP: 122/70 130/76  125/66  Pulse: 79 75  69  Resp: 20 19  20   Temp: 97.8 F (36.6 C) 98.6 F (37 C)    TempSrc:      SpO2: 97% 100%  96%  Weight:   52 kg   Height:        Intake/Output Summary (Last 24 hours) at 09/02/2019 1701 Last data filed at 09/02/2019 0958 Gross per 24 hour  Intake 360 ml  Output 300 ml  Net 60 ml   Filed Weights   08/30/19 1445 09/01/19 0440 09/02/19 0500  Weight: 53.3 kg 52.8 kg 52 kg   Gen: Pleasant, interactive female in no distress Pulm: Nonlabored breathing with end-expiratory wheezes bilaterally, good air movement, no significant crackles. CV: Regular rate and rhythm. No murmur, rub, or gallop. No JVD, no dependent edema. GI: Abdomen soft, non-tender, non-distended, with normoactive bowel sounds.  Ext: Warm, no deformities Skin: No rashes, lesions or ulcers on visualized skin. Left hip surgical wound healing well. Neuro: Alert and oriented. No focal neurological deficits. Psych: Judgement and insight appear fair, has medical decision making capacity. Mood euthymic & affect congruent. Behavior is appropriate.     Data Reviewed: I have personally reviewed following labs and imaging studies  CBC: Recent Labs  Lab 08/30/19 0310 08/30/19 0845 08/31/19 0406 09/01/19 0358 09/02/19 0350  WBC 11.6* 9.5 9.9 8.8 8.3  NEUTROABS 9.3*  --  6.9 6.2 6.0  HGB 11.6* 11.7* 11.4* 11.0* 10.9*  HCT 36.6 36.7 34.8* 34.9* 33.0*  MCV 114.4* 114.3* 113.7* 114.4* 111.9*  PLT 817* 814* 797* 710* 449*   Basic Metabolic Panel: Recent Labs  Lab 08/30/19 0310 08/30/19 0845 08/31/19 0406 09/01/19 0358 09/02/19 0350  NA 137  --  139 135 140  K 4.2  --  3.5 3.6 3.3*  CL 101  --  99 98 100  CO2 25  --  28 27 30   GLUCOSE 142*  --  103* 94 86  BUN 17  --  26* 32* 27*  CREATININE 0.48  0.58 0.76 0.79 0.58  CALCIUM 8.4*  --  8.8* 8.6* 8.5*   GFR: Estimated Creatinine Clearance: 37.6 mL/min (by C-G formula based on SCr of 0.58 mg/dL). Liver Function Tests: Recent Labs  Lab 08/31/19 0406 09/01/19 0358 09/02/19 0350  AST 24 26 20   ALT 14 15 15   ALKPHOS 208* 193* 194*  BILITOT 0.8 0.6 0.6  PROT 5.7* 5.5* 5.2*  ALBUMIN 2.9* 2.8* 2.8*   No results for input(s): LIPASE, AMYLASE in the last 168 hours. No results for input(s): AMMONIA in the last 168 hours. Coagulation Profile: No results for input(s): INR, PROTIME in the last 168 hours. Cardiac Enzymes: No results for input(s): CKTOTAL, CKMB, CKMBINDEX, TROPONINI in the last 168 hours. BNP (last 3 results) No results for input(s): PROBNP in the last 8760 hours. HbA1C: No results for input(s): HGBA1C in the last 72 hours. CBG: No results for input(s): GLUCAP in the last 168 hours. Lipid Profile: No results for input(s): CHOL, HDL, LDLCALC, TRIG,  CHOLHDL, LDLDIRECT in the last 72 hours. Thyroid Function Tests: No results for input(s): TSH, T4TOTAL, FREET4, T3FREE, THYROIDAB in the last 72 hours. Anemia Panel: No results for input(s): VITAMINB12, FOLATE, FERRITIN, TIBC, IRON, RETICCTPCT in the last 72 hours. Urine analysis: No results found for: COLORURINE, APPEARANCEUR, LABSPEC, PHURINE, GLUCOSEU, HGBUR, BILIRUBINUR, KETONESUR, PROTEINUR, UROBILINOGEN, NITRITE, LEUKOCYTESUR Recent Results (from the past 240 hour(s))  SARS Coronavirus 2 by RT PCR (hospital order, performed in [facility] hospital lab) Nasopharyngeal Nasopharyngeal Swab     Status: Abnormal   Collection Time: 08/30/19  3:10 AM   Specimen: Nasopharyngeal Swab  Result Value Ref Range Status   SARS Coronavirus 2 POSITIVE (A) NEGATIVE Final    Comment: RESULT CALLED TO, READ BACK BY AND VERIFIED WITH: DOWD,P. RN AT 0708 08/30/19 MULLINS,T (NOTE) SARS-CoV-2 target nucleic acids are DETECTED  SARS-CoV-2 RNA is generally detectable in upper respiratory  specimens  during the acute phase of infection.  Positive results are indicative  of the presence of the identified virus, but do not rule out bacterial infection or co-infection with other pathogens not detected by the test.  Clinical correlation with patient history and  other diagnostic information is necessary to determine patient infection status.  The expected result is negative.  Fact Sheet for Patients:   StrictlyIdeas.no   Fact Sheet for Healthcare Providers:   BankingDealers.co.za    This test is not yet approved or cleared by the [place] FDA and  has been authorized for detection and/or diagnosis of SARS-CoV-2 by FDA under an Emergency Use Authorization (EUA).  This EUA will remain in effect (meaning this  test can be used) for the duration of  the COVID-19 declaration under Section 564(b)(1) of the Act, 21 U.S.C. section 360-bbb-3(b)(1), unless the authorization is terminated or revoked sooner.  Performed at [facility], [place] [address] [place], [place] [address]       Radiology Studies: No results found.  Scheduled Meds: . albuterol  2 puff Inhalation Q6H  . vitamin C  500 mg Oral Daily  . aspirin EC  81 mg Oral Daily  . atenolol  25 mg Oral Daily  . calcium carbonate  1 tablet Oral QPM  . dexamethasone (DECADRON) injection  6 mg Intravenous Q24H  . docusate sodium  100 mg Oral BID  . enoxaparin (LOVENOX) injection  40 mg Subcutaneous Q24H  . ferrous sulfate  325 mg Oral Q breakfast  . folic acid  1 mg Oral Daily  . furosemide  20 mg Oral Daily  . hydroxyurea  1,000 mg Oral QODAY   And  . hydroxyurea  500 mg Oral QODAY  . levothyroxine  25 mcg Oral Daily  . multivitamin   Oral Daily  . pantoprazole  40 mg Oral Daily  . sertraline  50 mg Oral Daily  . umeclidinium-vilanterol  1 puff Inhalation Daily  . zinc sulfate  220 mg Oral Daily   Continuous Infusions: . remdesivir 100 mg in  NS 100 mL 100 mg (09/02/19 0838)     LOS: 3 days   Time spent: 25 minutes.  Patrecia Pour, MD [facility] [email or web address] 09/02/2019, 5:01 PM

## 2019-09-02 NOTE — TOC Progression Note (Addendum)
Transition of Care [facility]) - Progression Note    Patient Details  Name: Kathy Pierce MRN: [id number] Date of Birth: 05/19/1925  Transition of Care [facility]) CM/SW Contact  Ross Ludwig, [place] Phone Number:  09/02/2019, 4:18 PM  Clinical Narrative:     CSW spoke to patient and her daughter to discuss SNF placement.  CSW informed patient and daughter that because she is Covid+, there are only two SNFs besides [facility] that can take Covid+ patients.  Due to bed availabilty [facility] is the only facility that is able to accept patient pending insurance authorization.  CSW started insurance auth awaiting decision by Public relations account executive number [phone].    CSW explained to patient's daughter that patient may have to pay part of her copay ahead of time.  CSW asked SNF to contact daughter and explain the financial aspects of rehab.  CSW to continue to follow patient's progress throughout discharge planning.  CSW spoke to [facility], and they should be able to accept patient over the weekend if she is approved by [employer]. CSW to updated weekend [facility] team member.        Expected Discharge Plan and Services  Patient to go to [facility] once insurance approves, and patient is medically ready for discharge.         Expected Discharge Date: 09/02/19                                     Social Determinants of Health (SDOH) Interventions    Readmission Risk Interventions Readmission Risk Prevention Plan 07/29/2019  Transportation Screening Complete  Home Care Screening Complete  Medication Review (RN CM) Complete  Some recent data might be hidden

## 2019-09-02 NOTE — Progress Notes (Signed)
Physical Therapy Treatment Patient Details Name: Kathy Pierce MRN: [id number] DOB: [birth year] Today's Date: 09/02/2019    History of Present Illness Kathy Pierce is a 86 y.o. female with a history of HFpEF, 3L O2-dependent COPD, HTN, lung CA, depression, left leg SCC s/p XRT 2018, B12 deficiency, underweight, hypothyroidism, and recent admission for left hip fracture (discharged to [facility] 7/19) who presented from SNF with shortness of breath requiring 6L O2, also tachypneic, Positive Covid.    PT Comments    Assisted patient x 2 to [place]. Patient noted with productive cough multiple times. SPO2 at rest 96, during mobility down to 90%. Continue progressive PT.  Follow Up Recommendations  SNF     Equipment Recommendations  None recommended by PT    Recommendations for Other Services       Precautions / Restrictions Precautions Precautions: Fall Precaution Comments: monitor Sats, O2 dependent at baseline Restrictions Weight Bearing Restrictions: No    Mobility  Bed Mobility               General bed mobility comments: in recliner  Transfers Overall transfer level: Needs assistance Equipment used: Rolling walker (2 wheeled) Transfers: Sit to/from Stand Sit to Stand: Min assist Stand pivot transfers: Min assist       General transfer comment: patient requested BSC be placed diectly infront of  her. Patient stood with effort pushing from recliner, turned slowly 180* to [facility], then back to recliner. After settling patinet in recliner, patient reported that she needed to get back onto the [facility], performed transfer as before.  Ambulation/Gait             General Gait Details: pt. declined   Stairs             Wheelchair Mobility    Modified Rankin (Stroke Patients Only)       Balance                                            Cognition Arousal/Alertness: Awake/alert Behavior During Therapy: WFL for tasks  assessed/performed;Impulsive                                   General Comments: expresses not happy that she will go to [place] to complete rehab      Exercises General Exercises - Lower Extremity Long Arc Quad: AROM;20 reps;Seated;Both Hip Flexion/Marching: AROM;10 reps;Seated    General Comments        Pertinent Vitals/Pain Faces Pain Scale: Hurts little more Pain Location: left hip Pain Descriptors / Indicators: Discomfort Pain Intervention(s): Monitored during session    Home Living                      Prior Function            PT Goals (current goals can now be found in the care plan section) Progress towards PT goals: Progressing toward goals    Frequency    Min 2X/week      PT Plan Current plan remains appropriate    Co-evaluation              AM-PAC PT "6 Clicks" Mobility   Outcome Measure  Help needed turning from your back to your side while in a flat bed without using bedrails?:  A Little Help needed moving from lying on your back to sitting on the side of a flat bed without using bedrails?: A Little Help needed moving to and from a bed to a chair (including a wheelchair)?: A Little Help needed standing up from a chair using your arms (e.g., wheelchair or bedside chair)?: A Little Help needed to walk in hospital room?: A Little Help needed climbing 3-5 steps with a railing? : A Lot 6 Click Score: 17    End of Session   Activity Tolerance: Patient limited by fatigue Patient left: with call bell/phone within reach;in chair Nurse Communication: Mobility status PT Visit Diagnosis: Unsteadiness on feet (R26.81);Muscle weakness (generalized) (M62.81)     Time: 2761-4709 PT Time Calculation (min) (ACUTE ONLY): 26 min  Charges:  $Therapeutic Activity: 8-22 mins $Self Care/Home Management: [place] Pager [phone] Office [phone]    Claretha Cooper 09/02/2019, 12:32 PM

## 2019-09-02 NOTE — Discharge Instructions (Signed)
Patient scheduled for outpatient Remdesivir infusion at 11am on Saturday 8/7 at [facility]. Please inform the patient to park at [place], as staff will be escorting the patient through the [place] entrance of the hospital.    There is a wave flag banner located near the entrance on [address]. Turn into this entrance and immediately turn left and park in 1 of the 5 designated Covid Infusion Parking spots. There is a phone number on the sign, please stay in the car and call and let the staff know what spot you're in and we will come out and get you. For questions call [phone].  Thanks.

## 2019-09-02 NOTE — Progress Notes (Signed)
Patient scheduled for outpatient Remdesivir infusion at 11am on Saturday 8/7 at [facility]. Please inform the patient to park at [place], as staff will be escorting the patient through the [place] entrance of the hospital.    There is a wave flag banner located near the entrance on [address]. Turn into this entrance and immediately turn left and park in 1 of the 5 designated Covid Infusion Parking spots. There is a phone number on the sign, please call and let the staff know what spot you are in and we will come out and get you. For questions call [phone].  Thanks.

## 2019-09-02 NOTE — Plan of Care (Signed)

## 2019-09-03 ENCOUNTER — Ambulatory Visit (HOSPITAL_COMMUNITY): Payer: Medicare Other

## 2019-09-03 LAB — CBC WITH DIFFERENTIAL/PLATELET
Abs Immature Granulocytes: 0.12 10*3/uL — ABNORMAL HIGH (ref 0.00–0.07)
Basophils Absolute: 0 10*3/uL (ref 0.0–0.1)
Basophils Relative: 0 %
Eosinophils Absolute: 0.1 10*3/uL (ref 0.0–0.5)
Eosinophils Relative: 1 %
HCT: 35.7 % — ABNORMAL LOW (ref 36.0–46.0)
Hemoglobin: 11.3 g/dL — ABNORMAL LOW (ref 12.0–15.0)
Immature Granulocytes: 2 %
Lymphocytes Relative: 10 %
Lymphs Abs: 0.8 10*3/uL (ref 0.7–4.0)
MCH: 36.3 pg — ABNORMAL HIGH (ref 26.0–34.0)
MCHC: 31.7 g/dL (ref 30.0–36.0)
MCV: 114.8 fL — ABNORMAL HIGH (ref 80.0–100.0)
Monocytes Absolute: 1.3 10*3/uL — ABNORMAL HIGH (ref 0.1–1.0)
Monocytes Relative: 16 %
Neutro Abs: 5.5 10*3/uL (ref 1.7–7.7)
Neutrophils Relative %: 71 %
Platelets: 794 10*3/uL — ABNORMAL HIGH (ref 150–400)
RBC: 3.11 MIL/uL — ABNORMAL LOW (ref 3.87–5.11)
RDW: 18.1 % — ABNORMAL HIGH (ref 11.5–15.5)
WBC: 7.8 10*3/uL (ref 4.0–10.5)
nRBC: 0 % (ref 0.0–0.2)

## 2019-09-03 LAB — COMPREHENSIVE METABOLIC PANEL
ALT: 14 U/L (ref 0–44)
AST: 20 U/L (ref 15–41)
Albumin: 2.8 g/dL — ABNORMAL LOW (ref 3.5–5.0)
Alkaline Phosphatase: 199 U/L — ABNORMAL HIGH (ref 38–126)
Anion gap: 9 (ref 5–15)
BUN: 22 mg/dL (ref 8–23)
CO2: 29 mmol/L (ref 22–32)
Calcium: 8.5 mg/dL — ABNORMAL LOW (ref 8.9–10.3)
Chloride: 100 mmol/L (ref 98–111)
Creatinine, Ser: 0.6 mg/dL (ref 0.44–1.00)
GFR calc Af Amer: >60 mL/min (ref >60)
GFR calc non Af Amer: >60 mL/min (ref >60)
Glucose, Bld: 89 mg/dL (ref 70–99)
Potassium: 3.4 mmol/L — ABNORMAL LOW (ref 3.5–5.1)
Sodium: 138 mmol/L (ref 135–145)
Total Bilirubin: 0.6 mg/dL (ref 0.3–1.2)
Total Protein: 5.6 g/dL — ABNORMAL LOW (ref 6.5–8.1)

## 2019-09-03 LAB — C-REACTIVE PROTEIN: CRP: 2.1 mg/dL — ABNORMAL HIGH (ref <1.0)

## 2019-09-03 LAB — D-DIMER, QUANTITATIVE: D-Dimer, Quant: 2.79 ug/mL-FEU — ABNORMAL HIGH (ref 0.00–0.50)

## 2019-09-03 MED ORDER — POTASSIUM CHLORIDE CRYS ER 20 MEQ PO TBCR
40.0000 meq | EXTENDED_RELEASE_TABLET | Freq: Once | ORAL | Status: AC
  Administered 2019-09-03: 40 meq via ORAL
  Filled 2019-09-03: qty 2

## 2019-09-03 MED ORDER — TRAZODONE HCL 50 MG PO TABS
50.0000 mg | ORAL_TABLET | Freq: Every day | ORAL | Status: DC
  Administered 2019-09-03: 50 mg via ORAL
  Filled 2019-09-03: qty 1

## 2019-09-03 NOTE — Progress Notes (Signed)
PROGRESS NOTE  Kathy Pierce  ESP:[id number] DOB: [birth year] DOA: 08/30/2019 PCP: London Pepper, MD   Brief Narrative: Kathy Pierce is a 86 y.o. female with a history of HFpEF, 3L O2-dependent COPD, HTN, lung CA, depression, left leg SCC s/p XRT 2018, B12 deficiency, underweight, hypothyroidism, and recent admission for left hip fracture (discharged to [facility] 7/19) who presented from SNF with shortness of breath requiring 6L O2, also tachypneic.  Wbc at 11.6, slightly positive proBNP 236.5. Chest x-ray showed pulmonary congestion with subsequent CTA chest showing no PE but did have bilateral pleural effusions with interstitial and airspace infiltrates, likely edema. SARS-CoV-2 PCR was positive. The patient was admitted for acute on chronic diastolic CHF and [id number] infection. With treatment, hypoxia, work of breathing, and inflammatory marker elevations improved. She is felt to be stable for discharge to SNF. The patient strongly feels she will be able to be safe and rehabilitate at home, though after protracted discussions among the medical team, the patient, social work, and the patient's daughter, she has decided to go to SNF as recommended by PT. This is being pursued.  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  - Treat conditions as below. Weaned to putative baseline of 3L O2. Actually had resolution of hypoxia even at 1L O2, but increased back to 3L due to subjective dyspnea.  - Continue bronchodilators scheduled and prn. Steroids as  below.  Covid-19 infection: SARS-CoV-2 PCR positive on 8/3.  - Continue remdesivir x5 days (completes 8/7).  - Steroids x10 days; decadron 6mg  daily. CRP continues to smolder between 1-2.  - Continue airborne, contact precautions for 21 days from positive testing. - Enoxaparin prophylactic dose while admitted. CTA showed no PE. - Maintain euvolemia/net negative.   Acute on chronic HFpEF: Last echo showed LVEF 60-65%, G2DD, moderately elevated PASP. - Continue daily lasix 20mg  po (previously on lasix 20mg  po QOD). BUN:Cr stable.  - Monitor metabolic panel, daily weights, I/O.  - Continue atenolol, ASA  Hypokalemia:  - Supplement and monitor.  Insomnia:  - Trazodone qHS  Post-operative blood loss anemia: Much improved from prior discharge.  - Continue iron supplement.  Hypothyroidism: TSH 1.9 - Continue low dose synthroid  Underweight: BMI 19.74.   ET:  - Continue hydroxyurea. Macrocytosis confirms adherence.  Vitamin B12 deficiency:  - Continue monthly B12 injections, recent level 823.  Left hip fracture, osteoporosis: Routine healing postoperatively.  - Continue Vit D, Ca.  - PT/OT consulted to assist with disposition planning  Depression: Mood euthymic. - Continue SSRI  GERD:  - Continue PPI  DVT prophylaxis: Lovenox Code Status: DNR Family Communication: None at bedside; Speaking with daughter daily by phone Disposition Plan:  Status is: Inpatient  Remains inpatient appropriate because:Inpatient level of care appropriate due to severity of illness.   Dispo:  The patient is from: SNF              Anticipated d/c is to: SNF; pt now amenable to SNF which is being pursued.  Anticipated d/c date is: 1 day              Patient currently is medically stable to d/c.  Consultants:   None  Procedures:   None  Antimicrobials:  Remdesivir 8/3 - 8/7  Subjective: Could not get any sleep, requests medication for assistance. Melatonin didn't help.  No dyspnea or chest pain. No leg swelling or other complaints. Definitely amenable to going to SNF.  Objective: Vitals:   09/02/19 2000 09/03/19 0500 09/03/19 0556 09/03/19 1542  BP: 105/65  130/67 (!) 110/55  Pulse: 70  70 72  Resp: 20  18   Temp: 97.7 F (36.5 C)  97.7 F (36.5 C) 98 F (36.7 C)  TempSrc: Oral  Oral Oral  SpO2: 99%  98% 96%  Weight:  53 kg    Height:        Intake/Output Summary (Last 24 hours) at 09/03/2019 1627 Last data filed at 09/03/2019 1315 Gross per 24 hour  Intake 120 ml  Output 1375 ml  Net -1255 ml   Filed Weights   09/01/19 0440 09/02/19 0500 09/03/19 0500  Weight: 52.8 kg 52 kg 53 kg   Gen: Pleasant, elderly female in no distress Pulm: Nonlabored breathing supplemental oxygen. Clear. CV: Regular rate and rhythm. No murmur, rub, or gallop. No JVD, no dependent edema. GI: Abdomen soft, non-tender, non-distended, with normoactive bowel sounds.  Ext: Warm, no deformities Skin: No rashes, lesions or ulcers on visualized skin. Left lateral hip surgical wound healing well, no erythema or discharge. Neuro: Alert and oriented. No focal neurological deficits. Psych: Judgement and insight appear fair. Mood euthymic & affect congruent. Behavior is appropriate.    Data Reviewed: I have personally reviewed following labs and imaging studies  CBC: Recent Labs  Lab 08/30/19 0310 08/30/19 0310 08/30/19 0845 08/31/19 0406 09/01/19 0358 09/02/19 0350 09/03/19 0427  WBC 11.6*   < > 9.5 9.9 8.8 8.3 7.8  NEUTROABS 9.3*  --   --  6.9 6.2 6.0 5.5  HGB 11.6*   < > 11.7* 11.4* 11.0* 10.9* 11.3*  HCT 36.6   < > 36.7 34.8* 34.9* 33.0* 35.7*  MCV 114.4*   < > 114.3* 113.7* 114.4* 111.9* 114.8*  PLT 817*   < > 814* 797* 710* 740* 794*   < > = values in this interval not displayed.   Basic Metabolic Panel: Recent Labs  Lab 08/30/19 0310 08/30/19 0310 08/30/19 0845 08/31/19 0406 09/01/19 0358 09/02/19 0350 09/03/19 0427  NA 137  --   --  139 135 140 138   K 4.2  --   --  3.5 3.6 3.3* 3.4*  CL 101  --   --  99 98 100 100  CO2 25  --   --  28 27 30 29   GLUCOSE 142*  --   --  103* 94 86 89  BUN 17  --   --  26* 32* 27* 22  CREATININE 0.48   < > 0.58 0.76 0.79 0.58 0.60  CALCIUM 8.4*  --   --  8.8* 8.6* 8.5* 8.5*   < > = values in this interval not displayed.   GFR: Estimated Creatinine Clearance: 38.3 mL/min (by C-G formula based on SCr of 0.6 mg/dL). Liver Function Tests: Recent Labs  Lab 08/31/19 0406 09/01/19 0358 09/02/19 0350 09/03/19 0427  AST 24 26 20 20   ALT 14 15 15 14   ALKPHOS 208* 193* 194* 199*  BILITOT 0.8 0.6 0.6 0.6  PROT  5.7* 5.5* 5.2* 5.6*  ALBUMIN 2.9* 2.8* 2.8* 2.8*   No results for input(s): LIPASE, AMYLASE in the last 168 hours. No results for input(s): AMMONIA in the last 168 hours. Coagulation Profile: No results for input(s): INR, PROTIME in the last 168 hours. Cardiac Enzymes: No results for input(s): CKTOTAL, CKMB, CKMBINDEX, TROPONINI in the last 168 hours. BNP (last 3 results) No results for input(s): PROBNP in the last 8760 hours. HbA1C: No results for input(s): HGBA1C in the last 72 hours. CBG: No results for input(s): GLUCAP in the last 168 hours. Lipid Profile: No results for input(s): CHOL, HDL, LDLCALC, TRIG, CHOLHDL, LDLDIRECT in the last 72 hours. Thyroid Function Tests: No results for input(s): TSH, T4TOTAL, FREET4, T3FREE, THYROIDAB in the last 72 hours. Anemia Panel: No results for input(s): VITAMINB12, FOLATE, FERRITIN, TIBC, IRON, RETICCTPCT in the last 72 hours. Urine analysis: No results found for: COLORURINE, APPEARANCEUR, LABSPEC, PHURINE, GLUCOSEU, HGBUR, BILIRUBINUR, KETONESUR, PROTEINUR, UROBILINOGEN, NITRITE, LEUKOCYTESUR Recent Results (from the past 240 hour(s))  SARS Coronavirus 2 by RT PCR (hospital order, performed in [facility] hospital lab) Nasopharyngeal Nasopharyngeal Swab     Status: Abnormal   Collection Time: 08/30/19  3:10 AM   Specimen: Nasopharyngeal Swab   Result Value Ref Range Status   SARS Coronavirus 2 POSITIVE (A) NEGATIVE Final    Comment: RESULT CALLED TO, READ BACK BY AND VERIFIED WITH: DOWD,P. RN AT 0708 08/30/19 MULLINS,T (NOTE) SARS-CoV-2 target nucleic acids are DETECTED  SARS-CoV-2 RNA is generally detectable in upper respiratory specimens  during the acute phase of infection.  Positive results are indicative  of the presence of the identified virus, but do not rule out bacterial infection or co-infection with other pathogens not detected by the test.  Clinical correlation with patient history and  other diagnostic information is necessary to determine patient infection status.  The expected result is negative.  Fact Sheet for Patients:   StrictlyIdeas.no   Fact Sheet for Healthcare Providers:   BankingDealers.co.za    This test is not yet approved or cleared by the [place] FDA and  has been authorized for detection and/or diagnosis of SARS-CoV-2 by FDA under an Emergency Use Authorization (EUA).  This EUA will remain in effect (meaning this  test can be used) for the duration of  the COVID-19 declaration under Section 564(b)(1) of the Act, 21 U.S.C. section 360-bbb-3(b)(1), unless the authorization is terminated or revoked sooner.  Performed at [facility], [place] [address], [place], [place] [address]       Radiology Studies: No results found.  Scheduled Meds: . albuterol  2 puff Inhalation Q6H  . vitamin C  500 mg Oral Daily  . aspirin EC  81 mg Oral Daily  . atenolol  25 mg Oral Daily  . calcium carbonate  1 tablet Oral QPM  . dexamethasone (DECADRON) injection  6 mg Intravenous Q24H  . docusate sodium  100 mg Oral BID  . enoxaparin (LOVENOX) injection  40 mg Subcutaneous Q24H  . ferrous sulfate  325 mg Oral Q breakfast  . folic acid  1 mg Oral Daily  . furosemide  20 mg Oral Daily  . hydroxyurea  1,000 mg Oral QODAY   And  .  hydroxyurea  500 mg Oral QODAY  . levothyroxine  25 mcg Oral Daily  . multivitamin   Oral Daily  . pantoprazole  40 mg Oral Daily  . sertraline  50 mg Oral Daily  . umeclidinium-vilanterol  1 puff Inhalation  Daily  . zinc sulfate  220 mg Oral Daily   Continuous Infusions:    LOS: 4 days   Time spent: 25 minutes.  Patrecia Pour, MD [facility] [email or web address] 09/03/2019, 4:27 PM

## 2019-09-04 LAB — CBC WITH DIFFERENTIAL/PLATELET
Abs Immature Granulocytes: 0.1 10*3/uL — ABNORMAL HIGH (ref 0.00–0.07)
Basophils Absolute: 0 10*3/uL (ref 0.0–0.1)
Basophils Relative: 0 %
Eosinophils Absolute: 0.1 10*3/uL (ref 0.0–0.5)
Eosinophils Relative: 1 %
HCT: 36.2 % (ref 36.0–46.0)
Hemoglobin: 11.2 g/dL — ABNORMAL LOW (ref 12.0–15.0)
Immature Granulocytes: 1 %
Lymphocytes Relative: 10 %
Lymphs Abs: 0.7 10*3/uL (ref 0.7–4.0)
MCH: 35.2 pg — ABNORMAL HIGH (ref 26.0–34.0)
MCHC: 30.9 g/dL (ref 30.0–36.0)
MCV: 113.8 fL — ABNORMAL HIGH (ref 80.0–100.0)
Monocytes Absolute: 1.3 10*3/uL — ABNORMAL HIGH (ref 0.1–1.0)
Monocytes Relative: 18 %
Neutro Abs: 5 10*3/uL (ref 1.7–7.7)
Neutrophils Relative %: 70 %
Platelets: 858 10*3/uL — ABNORMAL HIGH (ref 150–400)
RBC: 3.18 MIL/uL — ABNORMAL LOW (ref 3.87–5.11)
RDW: 17.9 % — ABNORMAL HIGH (ref 11.5–15.5)
WBC: 7.2 10*3/uL (ref 4.0–10.5)
nRBC: 0 % (ref 0.0–0.2)

## 2019-09-04 LAB — COMPREHENSIVE METABOLIC PANEL
ALT: 15 U/L (ref 0–44)
AST: 19 U/L (ref 15–41)
Albumin: 2.9 g/dL — ABNORMAL LOW (ref 3.5–5.0)
Alkaline Phosphatase: 189 U/L — ABNORMAL HIGH (ref 38–126)
Anion gap: 12 (ref 5–15)
BUN: 24 mg/dL — ABNORMAL HIGH (ref 8–23)
CO2: 26 mmol/L (ref 22–32)
Calcium: 8.5 mg/dL — ABNORMAL LOW (ref 8.9–10.3)
Chloride: 100 mmol/L (ref 98–111)
Creatinine, Ser: 0.59 mg/dL (ref 0.44–1.00)
GFR calc Af Amer: >60 mL/min (ref >60)
GFR calc non Af Amer: >60 mL/min (ref >60)
Glucose, Bld: 94 mg/dL (ref 70–99)
Potassium: 4.3 mmol/L (ref 3.5–5.1)
Sodium: 138 mmol/L (ref 135–145)
Total Bilirubin: 0.6 mg/dL (ref 0.3–1.2)
Total Protein: 5.4 g/dL — ABNORMAL LOW (ref 6.5–8.1)

## 2019-09-04 LAB — D-DIMER, QUANTITATIVE: D-Dimer, Quant: 1.63 ug/mL-FEU — ABNORMAL HIGH (ref 0.00–0.50)

## 2019-09-04 LAB — C-REACTIVE PROTEIN: CRP: 2 mg/dL — ABNORMAL HIGH (ref <1.0)

## 2019-09-04 MED ORDER — MELATONIN 5 MG PO TABS
5.0000 mg | ORAL_TABLET | Freq: Every day | ORAL | Status: DC
  Administered 2019-09-04: 5 mg via ORAL
  Filled 2019-09-04: qty 1

## 2019-09-04 MED ORDER — DIPHENHYDRAMINE HCL 25 MG PO CAPS
25.0000 mg | ORAL_CAPSULE | Freq: Once | ORAL | Status: AC
  Administered 2019-09-04: 25 mg via ORAL
  Filled 2019-09-04: qty 1

## 2019-09-04 MED ORDER — ALUM & MAG HYDROXIDE-SIMETH 200-200-20 MG/5ML PO SUSP
15.0000 mL | Freq: Four times a day (QID) | ORAL | Status: DC | PRN
  Administered 2019-09-04 (×2): 15 mL via ORAL
  Filled 2019-09-04 (×2): qty 30

## 2019-09-04 NOTE — Progress Notes (Signed)
Patient received Trazadone this evening for sleep aid, after melatonin ineffective yesterday. Patient states that Trazadone is ineffective, and is requesting an alternate sleep aid.

## 2019-09-04 NOTE — TOC Progression Note (Signed)
Transition of Care [facility]) - Progression Note    Patient Details  Name: Kathy Pierce MRN: [id number] Date of Birth: [birth year]  Transition of [facility]) CM/SW Contact  [name], [place] Phone Number: 09/04/2019, 11:39 AM  Clinical Narrative:   Insurance auth still pending.        Expected Discharge Plan and Services           Expected Discharge Date: 09/02/19                                     Social Determinants of Health (SDOH) Interventions    Readmission Risk Interventions Readmission Risk Prevention Plan 09/02/2019 07/29/2019  Transportation Screening Complete Complete  Home Care Screening - Complete  Medication Review (RN CM) - Complete  Medication Review Press photographer) Referral to Pharmacy -  PCP or Specialist appointment within 3-5 days of discharge Complete -  [place] or Home Care Consult Complete -  SW Recovery Care/Counseling Consult Complete -  Palliative Care Screening Not Applicable -  August Complete -  Some recent data might be hidden

## 2019-09-04 NOTE — Progress Notes (Signed)
PROGRESS NOTE  Kathy Pierce  ZDG:[id number] DOB: September 11, 1925 DOA: 08/30/2019 PCP: London Pepper, MD   Brief Narrative: Kathy Pierce is a 86 y.o. female with a history of HFpEF, 3L O2-dependent COPD, HTN, lung CA, depression, left leg SCC s/p XRT 2018, B12 deficiency, underweight, hypothyroidism, and recent admission for left hip fracture (discharged to [facility] 7/19) who presented from SNF with shortness of breath requiring 6L O2, also tachypneic.  Wbc at 11.6, slightly positive proBNP 236.5. Chest x-ray showed pulmonary congestion with subsequent CTA chest showing no PE but did have bilateral pleural effusions with interstitial and airspace infiltrates, likely edema. SARS-CoV-2 PCR was positive. The patient was admitted for acute on chronic diastolic CHF and [id number] infection. With treatment, hypoxia, work of breathing, and inflammatory marker elevations improved. She is felt to be stable for discharge to SNF. The patient strongly feels she will be able to be safe and rehabilitate at home, though after protracted discussions among the medical team, the patient, social work, and the patient's daughter, she has decided to go to SNF as recommended by PT. This is being pursued.  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  - Treat conditions as below. Weaned to previous baseline of 3L O2. Actually had resolution of hypoxia even at 1L O2, but increased back to 3L due to subjective dyspnea.  - Continue bronchodilators scheduled and prn. Steroids as  below.  Covid-19 infection: SARS-CoV-2 PCR positive on 8/3.  -Completed Remdesivir 09/03/2019 - Steroids x10 days; decadron 6mg  daily. CRP stable around 2.  - Continue airborne, contact precautions for 21 days from positive testing. - Enoxaparin prophylactic dose while admitted. CTA showed no PE. - Maintain euvolemia/net negative.   Acute on chronic HFpEF: Last echo showed LVEF 60-65%, G2DD, moderately elevated PASP. - Continue daily lasix 20mg  po (previously on lasix 20mg  po QOD). BUN:Cr stable.  - Monitor metabolic panel, daily weights, I/O.  - Continue atenolol, ASA  Hypokalemia:  - Supplement and monitor.  Insomnia:  - Trazodone qHS  Post-operative blood loss anemia: Much improved from prior discharge.  - Continue iron supplement.  Hypothyroidism: TSH 1.9 - Continue low dose synthroid  Underweight: BMI 19.74.   Essential thrombocythemia:  - Continue hydroxyurea. Macrocytosis confirms adherence.  Vitamin B12 deficiency:  - Continue monthly B12 injections, recent level 823.  Left hip fracture, osteoporosis: Routine healing postoperatively.  - Continue Vit D, Ca.  - PT/OT consulted to assist with disposition planning  Depression:  - Continue SSRI  GERD:  - Continue PPI  DVT prophylaxis: Lovenox Code Status: DNR Family Communication: None at bedside; updating family via phone Disposition Plan:  Status is: Inpatient  Remains inpatient appropriate because:Inpatient level of care appropriate due to severity of illness.   Dispo:  The patient is from: SNF              Anticipated d/c is to: SNF; pt now amenable to SNF which is being pursued.              Anticipated d/c date is: 1 day  Patient currently is medically stable to d/c.  Consultants:   None  Procedures:   None  Antimicrobials:  Remdesivir 8/3 - 8/7  Subjective: Continues to complain of poor sleep, indicates melatonin and Benadryl did not help. No dyspnea or chest pain. No leg  swelling or other complaints.  Objective: Vitals:   09/03/19 1542 09/03/19 2159 09/04/19 0500 09/04/19 0534  BP: (!) 110/55 (!) 111/58  (!) 115/57  Pulse: 72 76  69  Resp:  (!) 22  20  Temp: 98 F (36.7 C) 98.1 F (36.7 C)  97.8 F (36.6 C)  TempSrc: Oral     SpO2: 96% 96%  98%  Weight:   51.2 kg   Height:        Intake/Output Summary (Last 24 hours) at 09/04/2019 0803 Last data filed at 09/04/2019 9563 Gross per 24 hour  Intake 120 ml  Output 2125 ml  Net -2005 ml   Filed Weights   09/02/19 0500 09/03/19 0500 09/04/19 0500  Weight: 52 kg 53 kg 51.2 kg   Gen: Pleasant, elderly female in no distress Pulm: Nonlabored breathing supplemental oxygen. Clear. CV: Regular rate and rhythm. No murmur, rub, or gallop. No JVD, no dependent edema. GI: Abdomen soft, non-tender, non-distended, with normoactive bowel sounds.  Ext: Warm, no deformities Skin: No rashes, lesions or ulcers on visualized skin. Left lateral hip surgical wound healing well, no erythema or discharge. Neuro: Alert and oriented. No focal neurological deficits. Psych: Judgement and insight appear fair. Mood euthymic & affect congruent. Behavior is appropriate.    Data Reviewed: I have personally reviewed following labs and imaging studies  CBC: Recent Labs  Lab 08/31/19 0406 09/01/19 0358 09/02/19 0350 09/03/19 0427 09/04/19 0335  WBC 9.9 8.8 8.3 7.8 7.2  NEUTROABS 6.9 6.2 6.0 5.5 5.0  HGB 11.4* 11.0* 10.9* 11.3* 11.2*  HCT 34.8* 34.9* 33.0* 35.7* 36.2  MCV 113.7* 114.4* 111.9* 114.8* 113.8*  PLT 797* 710* 740* 794* 875*   Basic Metabolic Panel: Recent Labs  Lab 08/31/19 0406 09/01/19 0358 09/02/19 0350 09/03/19 0427 09/04/19 0335  NA 139 135 140 138 138  K 3.5 3.6 3.3* 3.4* 4.3  CL 99 98 100 100 100  CO2 28 27 30 29 26   GLUCOSE 103* 94 86 89 94  BUN 26* 32* 27* 22 24*  CREATININE 0.76 0.79 0.58 0.60 0.59  CALCIUM 8.8* 8.6* 8.5* 8.5* 8.5*   GFR: Estimated Creatinine Clearance: 37 mL/min (by  C-G formula based on SCr of 0.59 mg/dL). Liver Function Tests: Recent Labs  Lab 08/31/19 0406 09/01/19 0358 09/02/19 0350 09/03/19 0427 09/04/19 0335  AST 24 26 20 20 19   ALT 14 15 15 14 15   ALKPHOS 208* 193* 194* 199* 189*  BILITOT 0.8 0.6 0.6 0.6 0.6  PROT 5.7* 5.5* 5.2* 5.6* 5.4*  ALBUMIN 2.9* 2.8* 2.8* 2.8* 2.9*   No results for input(s): LIPASE, AMYLASE in the last 168 hours. No results for input(s): AMMONIA in the last 168 hours. Coagulation Profile: No results for input(s): INR, PROTIME in the last 168 hours. Cardiac Enzymes: No results for input(s): CKTOTAL, CKMB, CKMBINDEX, TROPONINI in the last 168 hours. BNP (last 3 results) No results for input(s): PROBNP in the last 8760 hours. HbA1C: No results for input(s): HGBA1C in the last 72 hours. CBG: No results for input(s): GLUCAP in the last 168 hours. Lipid Profile: No results for input(s): CHOL, HDL, LDLCALC, TRIG, CHOLHDL, LDLDIRECT in the last 72 hours. Thyroid Function Tests: No results for input(s):  TSH, T4TOTAL, FREET4, T3FREE, THYROIDAB in the last 72 hours. Anemia Panel: No results for input(s): VITAMINB12, FOLATE, FERRITIN, TIBC, IRON, RETICCTPCT in the last 72 hours. Urine analysis: No results found for: COLORURINE, APPEARANCEUR, LABSPEC, PHURINE, GLUCOSEU, HGBUR, BILIRUBINUR, KETONESUR, PROTEINUR, UROBILINOGEN, NITRITE, LEUKOCYTESUR Recent Results (from the past 240 hour(s))  SARS Coronavirus 2 by RT PCR (hospital order, performed in [facility] hospital lab) Nasopharyngeal Nasopharyngeal Swab     Status: Abnormal   Collection Time: 08/30/19  3:10 AM   Specimen: Nasopharyngeal Swab  Result Value Ref Range Status   SARS Coronavirus 2 POSITIVE (A) NEGATIVE Final    Comment: RESULT CALLED TO, READ BACK BY AND VERIFIED WITH: DOWD,P. RN AT 0708 08/30/19 MULLINS,T (NOTE) SARS-CoV-2 target nucleic acids are DETECTED  SARS-CoV-2 RNA is generally detectable in upper respiratory specimens  during the acute  phase of infection.  Positive results are indicative  of the presence of the identified virus, but do not rule out bacterial infection or co-infection with other pathogens not detected by the test.  Clinical correlation with patient history and  other diagnostic information is necessary to determine patient infection status.  The expected result is negative.  Fact Sheet for Patients:   StrictlyIdeas.no   Fact Sheet for Healthcare Providers:   BankingDealers.co.za    This test is not yet approved or cleared by the [place] FDA and  has been authorized for detection and/or diagnosis of SARS-CoV-2 by FDA under an Emergency Use Authorization (EUA).  This EUA will remain in effect (meaning this  test can be used) for the duration of  the COVID-19 declaration under Section 564(b)(1) of the Act, 21 U.S.C. section 360-bbb-3(b)(1), unless the authorization is terminated or revoked sooner.  Performed at [facility], [place] [address], [place], [place] [address]       Radiology Studies: No results found.  Scheduled Meds: . albuterol  2 puff Inhalation Q6H  . vitamin C  500 mg Oral Daily  . aspirin EC  81 mg Oral Daily  . atenolol  25 mg Oral Daily  . calcium carbonate  1 tablet Oral QPM  . dexamethasone (DECADRON) injection  6 mg Intravenous Q24H  . docusate sodium  100 mg Oral BID  . enoxaparin (LOVENOX) injection  40 mg Subcutaneous Q24H  . ferrous sulfate  325 mg Oral Q breakfast  . folic acid  1 mg Oral Daily  . furosemide  20 mg Oral Daily  . hydroxyurea  1,000 mg Oral QODAY   And  . hydroxyurea  500 mg Oral QODAY  . levothyroxine  25 mcg Oral Daily  . multivitamin   Oral Daily  . pantoprazole  40 mg Oral Daily  . sertraline  50 mg Oral Daily  . umeclidinium-vilanterol  1 puff Inhalation Daily  . zinc sulfate  220 mg Oral Daily   Continuous Infusions:   LOS: 5 days   Time spent: 25 minutes.  Little Ishikawa, DO [facility] [email or web address] 09/04/2019, 8:03 AM

## 2019-09-05 DIAGNOSIS — J1282 Pneumonia due to coronavirus disease 2019: Secondary | ICD-10-CM | POA: Diagnosis not present

## 2019-09-05 DIAGNOSIS — Z4789 Encounter for other orthopedic aftercare: Secondary | ICD-10-CM | POA: Diagnosis not present

## 2019-09-05 DIAGNOSIS — M79605 Pain in left leg: Secondary | ICD-10-CM | POA: Diagnosis not present

## 2019-09-05 DIAGNOSIS — M810 Age-related osteoporosis without current pathological fracture: Secondary | ICD-10-CM | POA: Diagnosis not present

## 2019-09-05 DIAGNOSIS — C3490 Malignant neoplasm of unspecified part of unspecified bronchus or lung: Secondary | ICD-10-CM | POA: Diagnosis not present

## 2019-09-05 DIAGNOSIS — M6281 Muscle weakness (generalized): Secondary | ICD-10-CM | POA: Diagnosis not present

## 2019-09-05 DIAGNOSIS — D473 Essential (hemorrhagic) thrombocythemia: Secondary | ICD-10-CM | POA: Diagnosis not present

## 2019-09-05 DIAGNOSIS — H04123 Dry eye syndrome of bilateral lacrimal glands: Secondary | ICD-10-CM | POA: Diagnosis not present

## 2019-09-05 DIAGNOSIS — R7989 Other specified abnormal findings of blood chemistry: Secondary | ICD-10-CM | POA: Diagnosis not present

## 2019-09-05 DIAGNOSIS — D649 Anemia, unspecified: Secondary | ICD-10-CM | POA: Diagnosis not present

## 2019-09-05 DIAGNOSIS — J9600 Acute respiratory failure, unspecified whether with hypoxia or hypercapnia: Secondary | ICD-10-CM | POA: Diagnosis not present

## 2019-09-05 DIAGNOSIS — Z7401 Bed confinement status: Secondary | ICD-10-CM | POA: Diagnosis not present

## 2019-09-05 DIAGNOSIS — R488 Other symbolic dysfunctions: Secondary | ICD-10-CM | POA: Diagnosis not present

## 2019-09-05 DIAGNOSIS — M25552 Pain in left hip: Secondary | ICD-10-CM | POA: Diagnosis not present

## 2019-09-05 DIAGNOSIS — W19XXXA Unspecified fall, initial encounter: Secondary | ICD-10-CM | POA: Diagnosis not present

## 2019-09-05 DIAGNOSIS — J9621 Acute and chronic respiratory failure with hypoxia: Secondary | ICD-10-CM | POA: Diagnosis not present

## 2019-09-05 DIAGNOSIS — J449 Chronic obstructive pulmonary disease, unspecified: Secondary | ICD-10-CM | POA: Diagnosis not present

## 2019-09-05 DIAGNOSIS — M79652 Pain in left thigh: Secondary | ICD-10-CM | POA: Diagnosis not present

## 2019-09-05 DIAGNOSIS — I5033 Acute on chronic diastolic (congestive) heart failure: Secondary | ICD-10-CM | POA: Diagnosis not present

## 2019-09-05 DIAGNOSIS — R278 Other lack of coordination: Secondary | ICD-10-CM | POA: Diagnosis not present

## 2019-09-05 DIAGNOSIS — E559 Vitamin D deficiency, unspecified: Secondary | ICD-10-CM | POA: Diagnosis not present

## 2019-09-05 DIAGNOSIS — M2550 Pain in unspecified joint: Secondary | ICD-10-CM | POA: Diagnosis not present

## 2019-09-05 DIAGNOSIS — L97921 Non-pressure chronic ulcer of unspecified part of left lower leg limited to breakdown of skin: Secondary | ICD-10-CM | POA: Diagnosis not present

## 2019-09-05 DIAGNOSIS — R5381 Other malaise: Secondary | ICD-10-CM | POA: Diagnosis not present

## 2019-09-05 DIAGNOSIS — R2681 Unsteadiness on feet: Secondary | ICD-10-CM | POA: Diagnosis not present

## 2019-09-05 DIAGNOSIS — U071 COVID-19: Secondary | ICD-10-CM | POA: Diagnosis not present

## 2019-09-05 DIAGNOSIS — I110 Hypertensive heart disease with heart failure: Secondary | ICD-10-CM | POA: Diagnosis not present

## 2019-09-05 DIAGNOSIS — S72002A Fracture of unspecified part of neck of left femur, initial encounter for closed fracture: Secondary | ICD-10-CM | POA: Diagnosis not present

## 2019-09-05 DIAGNOSIS — S7292XD Unspecified fracture of left femur, subsequent encounter for closed fracture with routine healing: Secondary | ICD-10-CM | POA: Diagnosis not present

## 2019-09-05 DIAGNOSIS — M48061 Spinal stenosis, lumbar region without neurogenic claudication: Secondary | ICD-10-CM | POA: Diagnosis not present

## 2019-09-05 DIAGNOSIS — J9601 Acute respiratory failure with hypoxia: Secondary | ICD-10-CM | POA: Diagnosis not present

## 2019-09-05 DIAGNOSIS — R41841 Cognitive communication deficit: Secondary | ICD-10-CM | POA: Diagnosis not present

## 2019-09-05 MED ORDER — OXYCODONE HCL 5 MG PO TABS: ORAL_TABLET | ORAL | 0 refills | Status: AC

## 2019-09-05 NOTE — Care Management Important Message (Signed)
Important Message  Patient Details IM Letter given to the Patient Name: Kathy Pierce MRN: [id number] Date of Birth: 09/16/1925   Medicare Important Message Given:  Yes     Kerin Salen 09/05/2019, 10:59 AM

## 2019-09-05 NOTE — Consult Note (Signed)
   [facility] Inpatient Consult   09/05/2019  Kathy Pierce 03/18/1925 [id number]   [place] [facility] [ACO] Patient: Primary Care Provider and [facility]   [place] for [place]  Patient screened for extreme high risk score for unplanned readmission score and for less than 30 days readmission hospitalization to check if potential [facility] service needs.  Review of patient's medical record reveals patient is for a skilled nursing facility level of care for transition.  Primary Care Provider is London Pepper, MD [place] practice does the follow up for transition of care.  Patient needs to be met for transition at a skilled nursing facility at this time.  Plan: Will sign off at transition, needs for transition at a skilled facility.  For questions contact:   Natividad Brood, RN BSN [facility] Liaison  [phone] business mobile phone Toll free office [phone]  Fax number: [phone] [email or web address]

## 2019-09-05 NOTE — Discharge Summary (Signed)
Physician Discharge Summary  Kathy Pierce ACZ:[id number] DOB: 04-18-1925 DOA: 08/30/2019  PCP: London Pepper, MD  Admit date: 08/30/2019 Discharge date: 09/05/2019  Admitted From: SNF Disposition: SNF  Recommendations for Outpatient Follow-up:  1. Follow up with PCP in 1-2 weeks, orthopedics as scheduled 2. Please obtain BMP/CBC in one week 3. Please follow up on the following pending results:  Discharge Condition: Stable CODE STATUS: DNR Diet recommendation: As tolerated  Brief/Interim Summary: Kathy Pierce is a 86 y.o. female with a history of HFpEF, 3L O2-dependent COPD, HTN, lung CA, depression, left leg SCC s/p XRT 2018, B12 deficiency, underweight, hypothyroidism, and recent admission for left hip fracture (discharged to [facility] 7/19) who presented from SNF with shortness of breath requiring 6L O2, also tachypneic. Wbc at 11.6, slightly positive proBNP 236.5.Chest x-ray showed pulmonary congestion with subsequent CTA chest showing no PE but did have bilateral pleural effusions with interstitial and airspace infiltrates, likely edema. SARS-CoV-2 PCR was positive. The patient was admitted for acute on chronic diastolic CHF and FUXNA-[id number] infection. With treatment, hypoxia, work of breathing, and inflammatory marker elevations improved. She is felt to be stable for discharge to SNF. The patient strongly feels she will be able to be safe and rehabilitate at home, though after protracted discussions among the medical team, the patient, social work, and the patient's daughter, she has decided to go to SNF as recommended by PT.  Patient admitted from SNF with worsening shortness of breath and acute worsening on chronic hypoxia found to be Covid positive.  Patient's hospital stay she drastically improved on supportive care including meds.  Patient's inflammatory markers were downtrending appropriately, now she is back to her baseline chronic hypoxic needs at 3 L nasal cannula.  Is otherwise stable and  agreeable for discharge to SNF to continue physical therapy as outlined.  Follow-up with PCP and orthopedic surgery as indicated and scheduled.  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)   Essential thrombocythemia ([place])   Chronic obstructive pulmonary disease (HCC)   Essential hypertension   Major depression   Osteoporosis   Congestive heart failure (CHF) ([place])   COVID-19 virus infection    Discharge Instructions  Discharge Instructions    MyChart COVID-19 home monitoring program   Complete by: Sep 02, 2019    Is the patient willing to use the [place] for home monitoring?: Yes   Call MD for:  difficulty breathing, headache or visual disturbances   Complete by: As directed    Call MD for:  extreme fatigue   Complete by: As directed    Call MD for:  persistant dizziness or light-headedness   Complete by: As directed    Call MD for:  persistant nausea and vomiting   Complete by: As directed    Call MD for:  redness, tenderness, or signs of infection (pain, swelling, redness, odor or green/yellow discharge around incision site)   Complete by: As directed    Call MD for:  severe uncontrolled pain   Complete by: As directed    Call MD for:  temperature >100.4   Complete by: As directed    Diet - low sodium heart healthy   Complete by: As directed    Discharge instructions   Complete by: As directed    You are being discharged from the hospital after treatment for covid-19 infection. You are felt to be  stable enough to no longer require inpatient monitoring, testing, and treatment, though you will need to follow the recommendations below: - Continue taking decadron daily (sent to your pharmacy) - Complete remdesivir therapy tomorrow by returning to [facility] and following instructions to get the infusion. - Per CDC guidelines, you will need to remain in  isolation for 21 days from your first positive covid test. - Follow up with your doctor in the next week via telehealth or seek medical attention right away if your symptoms get WORSE.   Directions for you at home:  Wear a facemask You should wear a facemask that covers your nose and mouth when you are in the same room with other people and when you visit a healthcare provider. People who live with or visit you should also wear a facemask while they are in the same room with you.  Separate yourself from other people in your home As much as possible, you should stay in a different room from other people in your home. Also, you should use a separate bathroom, if available.  Avoid sharing household items You should not share dishes, drinking glasses, cups, eating utensils, towels, bedding, or other items with other people in your home. After using these items, you should wash them thoroughly with soap and water.  Cover your coughs and sneezes Cover your mouth and nose with a tissue when you cough or sneeze, or you can cough or sneeze into your sleeve. Throw used tissues in a lined trash can, and immediately wash your hands with soap and water for at least 20 seconds or use an alcohol-based hand rub.  Wash your [facility] your hands often and thoroughly with soap and water for at least 20 seconds. You can use an alcohol-based hand sanitizer if soap and water are not available and if your hands are not visibly dirty. Avoid touching your eyes, nose, and mouth with unwashed hands.  Directions for those who live with, or provide care at home for you:  Limit the number of people who have contact with the patient If possible, have only one caregiver for the patient. Other household members should stay in another home or place of residence. If this is not possible, they should stay in another room, or be separated from the patient as much as possible. Use a separate bathroom, if  available. Restrict visitors who do not have an essential need to be in the home.  Ensure good ventilation Make sure that shared spaces in the home have good air flow, such as from an air conditioner or an opened window, weather permitting.  Wash your hands often Wash your hands often and thoroughly with soap and water for at least 20 seconds. You can use an alcohol based hand sanitizer if soap and water are not available and if your hands are not visibly dirty. Avoid touching your eyes, nose, and mouth with unwashed hands. Use disposable paper towels to dry your hands. If not available, use dedicated cloth towels and replace them when they become wet.  Wear a facemask and gloves Wear a disposable facemask at all times in the room and gloves when you touch or have contact with the patient's blood, body fluids, and/or secretions or excretions, such as sweat, saliva, sputum, nasal mucus, vomit, urine, or feces.  Ensure the mask fits over your nose and mouth tightly, and do not touch it during use. Throw out disposable facemasks and gloves after using them. Do not reuse. Wash your  hands immediately after removing your facemask and gloves. If your personal clothing becomes contaminated, carefully remove clothing and launder. Wash your hands after handling contaminated clothing. Place all used disposable facemasks, gloves, and other waste in a lined container before disposing them with other household waste. Remove gloves and wash your hands immediately after handling these items.  Do not share dishes, glasses, or other household items with the patient Avoid sharing household items. You should not share dishes, drinking glasses, cups, eating utensils, towels, bedding, or other items with a patient who is confirmed to have, or being evaluated for, COVID-19 infection. After the person uses these items, you should wash them thoroughly with soap and water.  Wash laundry thoroughly Immediately remove  and wash clothes or bedding that have blood, body fluids, and/or secretions or excretions, such as sweat, saliva, sputum, nasal mucus, vomit, urine, or feces, on them. Wear gloves when handling laundry from the patient. Read and follow directions on labels of laundry or clothing items and detergent. In general, wash and dry with the warmest temperatures recommended on the label.  Clean all areas the individual has used often Clean all touchable surfaces, such as counters, tabletops, doorknobs, bathroom fixtures, toilets, phones, keyboards, tablets, and bedside tables, every day. Also, clean any surfaces that may have blood, body fluids, and/or secretions or excretions on them. Wear gloves when cleaning surfaces the patient has come in contact with. Use a diluted bleach solution (e.g., dilute bleach with 1 part bleach and 10 parts water) or a household disinfectant with a label that says EPA-registered for coronaviruses. To make a bleach solution at home, add 1 tablespoon of bleach to 1 quart (4 cups) of water. For a larger supply, add  cup of bleach to 1 gallon (16 cups) of water. Read labels of cleaning products and follow recommendations provided on product labels. Labels contain instructions for safe and effective use of the cleaning product including precautions you should take when applying the product, such as wearing gloves or eye protection and making sure you have good ventilation during use of the product. Remove gloves and wash hands immediately after cleaning.  Monitor yourself for signs and symptoms of illness Caregivers and household members are considered close contacts, should monitor their health, and will be asked to limit movement outside of the home to the extent possible. Follow the monitoring steps for close contacts listed on the symptom monitoring form.  If you have additional questions, contact your local health department or call the epidemiologist on call at  [phone] (available 24/7). This guidance is subject to change. For the most up-to-date guidance from [facility], please refer to their website: [email or web address]   Increase activity slowly   Complete by: As directed    No wound care   Complete by: As directed    No wound care   Complete by: As directed      Allergies as of 09/05/2019      Reactions   Penicillins Hives, Rash   Did it involve swelling of the face/tongue/throat, SOB, or low BP? N Did it involve sudden or severe rash/hives, skin peeling, or any reaction on the inside of your mouth or nose? Y Did you need to seek medical attention at a hospital or doctor's office? N When did it last happen?Over 35 Years Ago If all above answers are "NO", may proceed with cephalosporin use.   Codeine Nausea And Vomiting   Erythromycin Nausea And Vomiting   Sulfa Drugs Cross Reactors Nausea And Vomiting  Medication List    TAKE these medications   albuterol 108 (90 Base) MCG/ACT inhaler Commonly known as: VENTOLIN HFA Inhale 2 puffs into the lungs every 6 (six) hours as needed for wheezing or shortness of breath.   Anoro Ellipta 62.5-25 MCG/INH Aepb Generic drug: umeclidinium-vilanterol Inhale 1 puff into the lungs daily.   ARTIFICIAL TEARS OP Place 1 drop into both eyes as needed (for dry eyes).   aspirin 81 MG tablet Take 81 mg by mouth daily.   atenolol 25 MG tablet Commonly known as: TENORMIN Take 25 mg by mouth daily.   benzonatate 100 MG capsule Commonly known as: TESSALON Take 100 mg by mouth 3 (three) times daily as needed for cough.   calcium carbonate 1250 (500 Ca) MG tablet Commonly known as: OS-CAL - dosed in mg of elemental calcium Take 1 tablet by mouth every evening.   cyanocobalamin 1000 MCG/ML injection Commonly known as: (VITAMIN B-12) Inject 1,000 mcg into the muscle every 30 (thirty) days.   dexamethasone 6 MG tablet Commonly known as:  Decadron Take 1 tablet (6 mg total) by mouth daily for 6 days.   docusate sodium 100 MG capsule Commonly known as: COLACE Take 1 capsule (100 mg total) by mouth 2 (two) times daily.   enoxaparin 40 MG/0.4ML injection Commonly known as: LOVENOX Inject 0.4 mLs (40 mg total) into the skin daily. For DVT prophylaxis after surgery   ferrous sulfate 325 (65 FE) MG tablet Take 1 tablet (325 mg total) by mouth daily with breakfast.   folic acid 1 MG tablet Commonly known as: FOLVITE Take 1 tablet (1 mg total) by mouth daily.   furosemide 20 MG tablet Commonly known as: LASIX Take 1 tablet (20 mg total) by mouth daily. What changed: when to take this   hydroxyurea 500 MG capsule Commonly known as: HYDREA Take 500-1,000 mg by mouth every other day. May take with food to minimize GI side effects. Alternate Take 1 capsule (500 mg) and Take 2 capsules (1000 mg) Other Day   levothyroxine 25 MCG tablet Commonly known as: SYNTHROID Take 25 mcg by mouth daily.   meclizine 25 MG tablet Commonly known as: ANTIVERT Take 25 mg by mouth 3 (three) times daily as needed for dizziness.   omeprazole 20 MG capsule Commonly known as: PRILOSEC Take 20 mg by mouth every morning.   oxyCODONE 5 MG immediate release tablet Commonly known as: Oxy IR/ROXICODONE Take 1/2 to 1 pill every 6 hrs as needed for pain   polyethylene glycol 17 g packet Commonly known as: MIRALAX / GLYCOLAX Take 17 g by mouth daily.   PRESERVISION AREDS PO Take 1 tablet by mouth daily.   sertraline 100 MG tablet Commonly known as: ZOLOFT Take 50 mg by mouth daily.       Follow-up Information    London Pepper, MD. Schedule an appointment as soon as possible for a visit.   Specialty: Family Medicine Contact information: [address] [place] [address] [phone]              Allergies  Allergen Reactions  . Penicillins Hives and Rash    Did it involve swelling of the  face/tongue/throat, SOB, or low BP? N Did it involve sudden or severe rash/hives, skin peeling, or any reaction on the inside of your mouth or nose? Y Did you need to seek medical attention at a hospital or doctor's office? N When did it last happen?Over 28 Years Ago If all  above answers are "NO", may proceed with cephalosporin use.    . Codeine Nausea And Vomiting  . Erythromycin Nausea And Vomiting  . Sulfa Drugs Cross Reactors Nausea And Vomiting    Consultations:  None   Procedures/Studies: DG Chest 1 View  Result Date: 08/11/2019 CLINICAL DATA:  Pain following fall EXAM: CHEST  1 VIEW COMPARISON:  July 26, 2019 chest radiograph and July 27, 2019 chest CT FINDINGS: There is underlying emphysematous change. There is scarring in the left mid lung region with nearby fiducial clips, indicative of post radiation therapy fibrosis. No appreciable edema or airspace opacity. Heart is slightly enlarged with pulmonary vascularity reflective of underlying emphysematous change. No adenopathy. No bone lesions. IMPRESSION: Scarring with post radiation therapy fibrosis on the left, stable. Underlying emphysematous changes throughout the lungs, better delineated on recent CT. No frank edema or airspace opacity. Stable cardiac prominence. No adenopathy evident by radiography. Electronically Signed   By: Lowella Grip III M.D.   On: 08/11/2019 11:21   CT Head Wo Contrast  Result Date: 08/11/2019 CLINICAL DATA:  Fall EXAM: CT HEAD WITHOUT CONTRAST TECHNIQUE: Contiguous axial images were obtained from the base of the skull through the vertex without intravenous contrast. COMPARISON:  None. FINDINGS: Brain: There is no acute intracranial hemorrhage, mass effect, or edema. There is no acute appearing loss of gray-white differentiation. Chronic left parietal infarct is present. Patchy and confluent areas of hypoattenuation in the supratentorial white matter are nonspecific but probably reflect moderate  chronic microvascular ischemic changes. There is no extra-axial fluid collection. Prominence of the ventricles and sulci reflects mild generalized parenchymal volume loss. Vascular: There is atherosclerotic calcification at the skull base. Skull: Calvarium is unremarkable. Sinuses/Orbits: No acute finding. Other: None. IMPRESSION: No evidence of acute intracranial injury. Chronic/nonemergent findings detailed above. Electronically Signed   By: Macy Mis M.D.   On: 08/11/2019 13:34   CT Angio Chest PE W and/or Wo Contrast  Result Date: 08/30/2019 CLINICAL DATA:  Suspected pulmonary embolus. High probability. Low oxygen saturation. Elevated white cell count. History of hypertension and lung cancer. EXAM: CT ANGIOGRAPHY CHEST WITH CONTRAST TECHNIQUE: Multidetector CT imaging of the chest was performed using the standard protocol during bolus administration of intravenous contrast. Multiplanar CT image reconstructions and MIPs were obtained to evaluate the vascular anatomy. CONTRAST:  71mL OMNIPAQUE IOHEXOL 350 MG/ML SOLN COMPARISON:  07/27/2019 FINDINGS: Cardiovascular: Motion artifact limits the examination. There is good opacification of the central and segmental pulmonary arteries. No focal filling defects. No evidence of significant pulmonary embolus. Cardiac enlargement. Coronary artery and aortic calcifications. No pericardial effusions. Normal caliber aorta. Mediastinum/Nodes: No enlarged mediastinal, hilar, or axillary lymph nodes. Thyroid gland, trachea, and esophagus demonstrate no significant findings. Lungs/Pleura: Small bilateral pleural effusions. Atelectasis in the lung bases. Mosaic pattern to the lungs may be due to air trapping or edema. Interstitial thickening is likely edema. Peribronchial thickening with patent airways. Appearances are progressed since the previous study. Upper Abdomen: No acute abnormality. Musculoskeletal: Degenerative changes in the spine. Midthoracic vertebral  compression deformities without change since the prior study. Review of the MIP images confirms the above findings. IMPRESSION: 1. No evidence of significant pulmonary embolus. 2. Cardiac enlargement. Bilateral pleural effusions with interstitial and airspace infiltrates, likely edema. Progression since previous study. 3. Aortic atherosclerosis. Aortic Atherosclerosis (ICD10-I70.0). Electronically Signed   By: Lucienne Capers M.D.   On: 08/30/2019 05:56   CT Cervical Spine Wo Contrast  Result Date: 08/11/2019 CLINICAL DATA:  Fall EXAM: CT CERVICAL  SPINE WITHOUT CONTRAST TECHNIQUE: Multidetector CT imaging of the cervical spine was performed without intravenous contrast. Multiplanar CT image reconstructions were also generated. COMPARISON:  None. FINDINGS: Alignment: Anteroposterior alignment is maintained. Skull base and vertebrae: No acute cervical spine fracture. Vertebral body heights are preserved apart from degenerative endplate irregularity. No destructive osseous lesion. Soft tissues and spinal canal: No prevertebral fluid or swelling. No visible canal hematoma. Disc levels: Multilevel degenerative changes are present including disc space narrowing, endplate osteophytes, and facet and uncovertebral hypertrophy. Mild canal stenosis. Multilevel foraminal stenosis. Upper chest: Emphysema and scarring at the lung apices. Other: Mild calcified plaque at the ICA origins. IMPRESSION: No acute cervical spine fracture. Electronically Signed   By: Macy Mis M.D.   On: 08/11/2019 13:38   DG Chest Portable 1 View  Result Date: 08/30/2019 CLINICAL DATA:  Shortness of breath EXAM: PORTABLE CHEST 1 VIEW COMPARISON:  08/11/2019 FINDINGS: Cardiac enlargement with central pulmonary vascular congestion. Developing interstitial changes suggesting edema. Scarring and postoperative change in the left mid lung is unchanged. No pleural effusions. Mediastinal contours appear intact. IMPRESSION: Cardiac enlargement with  central pulmonary vascular congestion and developing interstitial edema. Stable scarring and postoperative change in the left mid lung related to previous radiation therapy. Electronically Signed   By: Lucienne Capers M.D.   On: 08/30/2019 03:23   DG C-Arm 1-60 Min-No Report  Result Date: 08/11/2019 Fluoroscopy was utilized by the requesting physician.  No radiographic interpretation.   DG HIP OPERATIVE UNILAT W OR W/O PELVIS LEFT  Result Date: 08/11/2019 CLINICAL DATA:  Hip fracture EXAM: OPERATIVE left HIP (WITH PELVIS IF PERFORMED) 5 VIEWS TECHNIQUE: Fluoroscopic spot image(s) were submitted for interpretation post-operatively. COMPARISON:  Pelvis 08/11/2019 FINDINGS: Left intertrochanteric fracture has been fixed with 2 screws and a locking intramedullary rod extending to the distal femur. Satisfactory fracture alignment. Satisfactory hardware positioning. IMPRESSION: ORIF left intertrochanteric fracture. Electronically Signed   By: Franchot Gallo M.D.   On: 08/11/2019 20:57   DG Hip Unilat With Pelvis 2-3 Views Left  Result Date: 08/11/2019 CLINICAL DATA:  Left hip pain after a fall today. EXAM: DG HIP (WITH OR WITHOUT PELVIS) 2-3V LEFT COMPARISON:  None. FINDINGS: The patient has a comminuted intertrochanteric fracture with subtrochanteric extension of the left hip. The lesser trochanter is a separate fragment. The femoral head is located. No other acute abnormality. IMPRESSION: Comminuted left intertrochanteric fracture with subtrochanteric extension. Electronically Signed   By: Inge Rise M.D.   On: 08/11/2019 11:22   DG FEMUR PORT MIN 2 VIEWS LEFT  Result Date: 08/11/2019 CLINICAL DATA:  Postop intertrochanteric femur fracture repair. EXAM: LEFT FEMUR PORTABLE 2 VIEWS COMPARISON:  Preoperative hip radiograph earlier today. FINDINGS: Intramedullary nail with distal locking and trans trochanteric screw fixation of intertrochanteric femur fracture. Persistent displacement of the lesser  trochanter but improved from preoperative imaging. Recent postsurgical change includes air and edema in the soft tissues. IMPRESSION: Post ORIF intertrochanteric left femur fracture without immediate postoperative complication. Electronically Signed   By: Keith Rake M.D.   On: 08/11/2019 20:43   VAS [place] LOWER EXTREMITY VENOUS (DVT)  Result Date: 08/22/2019  Lower Venous DVTStudy Indications: Swelling, Edema, and Pain. Other Indications: Status post left hip replacement 7/21. Comparison Study: No priors. Performing Technologist: Oda Cogan RDMS, RVT  Examination Guidelines: A complete evaluation includes B-mode imaging, spectral Doppler, color Doppler, and power Doppler as needed of all accessible portions of each vessel. Bilateral testing is considered an integral part of a complete examination. Limited  examinations for reoccurring indications may be performed as noted. The reflux portion of the exam is performed with the patient in reverse Trendelenburg.  +-----+---------------+---------+-----------+----------+--------------+ RIGHTCompressibilityPhasicitySpontaneityPropertiesThrombus Aging +-----+---------------+---------+-----------+----------+--------------+ CFV  Full           Yes      Yes                                 +-----+---------------+---------+-----------+----------+--------------+ SFJ  Full                                                        +-----+---------------+---------+-----------+----------+--------------+   +---------+---------------+---------+-----------+----------+--------------+ LEFT     CompressibilityPhasicitySpontaneityPropertiesThrombus Aging +---------+---------------+---------+-----------+----------+--------------+ CFV      Full           Yes      Yes                                 +---------+---------------+---------+-----------+----------+--------------+ SFJ      Full                                                         +---------+---------------+---------+-----------+----------+--------------+ FV Prox  Full                                                        +---------+---------------+---------+-----------+----------+--------------+ FV Mid   Full                                                        +---------+---------------+---------+-----------+----------+--------------+ FV DistalFull                                                        +---------+---------------+---------+-----------+----------+--------------+ PFV      Full                                                        +---------+---------------+---------+-----------+----------+--------------+ POP      Full           Yes      Yes                                 +---------+---------------+---------+-----------+----------+--------------+ PTV      Full                                                        +---------+---------------+---------+-----------+----------+--------------+  PERO     Full                                                        +---------+---------------+---------+-----------+----------+--------------+     Summary: RIGHT: - No evidence of common femoral vein obstruction.  LEFT: - There is no evidence of deep vein thrombosis in the lower extremity.  - A cystic structure is found in the popliteal fossa. - Small Baker's cyst 3.5 x 2.4 x 1.0 cm.  *See table(s) above for measurements and observations. Electronically signed by Ruta Hinds MD on 08/22/2019 at 7:34:54 PM.    Final       Subjective: No acute issues or events overnight, denies chest pain, shortness of breath, nausea, vomiting, diarrhea, constipation, headache, fevers, chills.   Discharge Exam: Vitals:   09/05/19 0739 09/05/19 1239  BP: 116/68 117/65  Pulse: 74 73  Resp: (!) 22 (!) 26  Temp: 97.8 F (36.6 C) 97.7 F (36.5 C)  SpO2: 95% 100%   Vitals:   09/04/19 2301 09/05/19 0058 09/05/19 0739 09/05/19 1239  BP:  108/65 129/76 116/68 117/65  Pulse: 76 76 74 73  Resp: 20 20 (!) 22 (!) 26  Temp: 98.2 F (36.8 C) 98.1 F (36.7 C) 97.8 F (36.6 C) 97.7 F (36.5 C)  TempSrc:      SpO2: 97% 97% 95% 100%  Weight:      Height:        General: Pt is alert, awake, not in acute distress Cardiovascular: RRR, S1/S2 +, no rubs, no gallops Respiratory: CTA bilaterally, no wheezing, no rhonchi Abdominal: Soft, NT, ND, bowel sounds + Extremities: no edema, no cyanosis    The results of significant diagnostics from this hospitalization (including imaging, microbiology, ancillary and laboratory) are listed below for reference.     Microbiology: Recent Results (from the past 240 hour(s))  SARS Coronavirus 2 by RT PCR (hospital order, performed in [facility] hospital lab) Nasopharyngeal Nasopharyngeal Swab     Status: Abnormal   Collection Time: 08/30/19  3:10 AM   Specimen: Nasopharyngeal Swab  Result Value Ref Range Status   SARS Coronavirus 2 POSITIVE (A) NEGATIVE Final    Comment: RESULT CALLED TO, READ BACK BY AND VERIFIED WITH: DOWD,P. RN AT 0708 08/30/19 MULLINS,T (NOTE) SARS-CoV-2 target nucleic acids are DETECTED  SARS-CoV-2 RNA is generally detectable in upper respiratory specimens  during the acute phase of infection.  Positive results are indicative  of the presence of the identified virus, but do not rule out bacterial infection or co-infection with other pathogens not detected by the test.  Clinical correlation with patient history and  other diagnostic information is necessary to determine patient infection status.  The expected result is negative.  Fact Sheet for Patients:   StrictlyIdeas.no   Fact Sheet for Healthcare Providers:   BankingDealers.co.za    This test is not yet approved or cleared by the [place] FDA and  has been authorized for detection and/or diagnosis of SARS-CoV-2 by FDA under an Emergency Use Authorization  (EUA).  This EUA will remain in effect (meaning this  test can be used) for the duration of  the COVID-19 declaration under Section 564(b)(1) of the Act, 21 U.S.C. section 360-bbb-3(b)(1), unless the authorization is terminated or revoked sooner.  Performed at [facility], [address] [place], [place] [address]      Labs: BNP (last 3 results) Recent Labs    07/26/19 2027 08/30/19 0310  BNP 350.8* 704.8*   Basic Metabolic Panel: Recent Labs  Lab 08/31/19 0406 09/01/19 0358 09/02/19 0350 09/03/19 0427 09/04/19 0335  NA 139 135 140 138 138  K 3.5 3.6 3.3* 3.4* 4.3  CL 99 98 100 100 100  CO2 28 27 30 29 26   GLUCOSE 103* 94 86 89 94  BUN 26* 32* 27* 22 24*  CREATININE 0.76 0.79 0.58 0.60 0.59  CALCIUM 8.8* 8.6* 8.5* 8.5* 8.5*   Liver Function Tests: Recent Labs  Lab 08/31/19 0406 09/01/19 0358 09/02/19 0350 09/03/19 0427 09/04/19 0335  AST 24 26 20 20 19   ALT 14 15 15 14 15   ALKPHOS 208* 193* 194* 199* 189*  BILITOT 0.8 0.6 0.6 0.6 0.6  PROT 5.7* 5.5* 5.2* 5.6* 5.4*  ALBUMIN 2.9* 2.8* 2.8* 2.8* 2.9*   No results for input(s): LIPASE, AMYLASE in the last 168 hours. No results for input(s): AMMONIA in the last 168 hours. CBC: Recent Labs  Lab 08/31/19 0406 09/01/19 0358 09/02/19 0350 09/03/19 0427 09/04/19 0335  WBC 9.9 8.8 8.3 7.8 7.2  NEUTROABS 6.9 6.2 6.0 5.5 5.0  HGB 11.4* 11.0* 10.9* 11.3* 11.2*  HCT 34.8* 34.9* 33.0* 35.7* 36.2  MCV 113.7* 114.4* 111.9* 114.8* 113.8*  PLT 797* 710* 740* 794* 858*   Cardiac Enzymes: No results for input(s): CKTOTAL, CKMB, CKMBINDEX, TROPONINI in the last 168 hours. BNP: Invalid input(s): POCBNP CBG: No results for input(s): GLUCAP in the last 168 hours. D-Dimer Recent Labs    09/03/19 0427 09/04/19 0335  DDIMER 2.79* 1.63*   Hgb A1c No results for input(s): HGBA1C in the last 72 hours. Lipid Profile No results for input(s): CHOL, HDL, LDLCALC, TRIG, CHOLHDL, LDLDIRECT in the  last 72 hours. Thyroid function studies No results for input(s): TSH, T4TOTAL, T3FREE, THYROIDAB in the last 72 hours.  Invalid input(s): FREET3 Anemia work up No results for input(s): VITAMINB12, FOLATE, FERRITIN, TIBC, IRON, RETICCTPCT in the last 72 hours. Urinalysis No results found for: COLORURINE, APPEARANCEUR, [place], [place], [place], [place], [place], [place], PROTEINUR, UROBILINOGEN, NITRITE, LEUKOCYTESUR Sepsis Labs Invalid input(s): PROCALCITONIN,  WBC,  LACTICIDVEN Microbiology Recent Results (from the past 240 hour(s))  SARS Coronavirus 2 by RT PCR (hospital order, performed in [facility] hospital lab) Nasopharyngeal Nasopharyngeal Swab     Status: Abnormal   Collection Time: 08/30/19  3:10 AM   Specimen: Nasopharyngeal Swab  Result Value Ref Range Status   SARS Coronavirus 2 POSITIVE (A) NEGATIVE Final    Comment: RESULT CALLED TO, READ BACK BY AND VERIFIED WITH: DOWD,P. RN AT 8891 08/30/19 MULLINS,T (NOTE) SARS-CoV-2 target nucleic acids are DETECTED  SARS-CoV-2 RNA is generally detectable in upper respiratory specimens  during the acute phase of infection.  Positive results are indicative  of the presence of the identified virus, but do not rule out bacterial infection or co-infection with other pathogens not detected by the test.  Clinical correlation with patient history and  other diagnostic information is necessary to determine patient infection status.  The expected result is negative.  Fact Sheet for Patients:   StrictlyIdeas.no   Fact Sheet for Healthcare Providers:   BankingDealers.co.za    This test is not yet approved or cleared by the [place] FDA and  has been authorized for detection and/or diagnosis of SARS-CoV-2 by FDA under an Emergency Use Authorization (EUA).  This EUA  will remain in effect (meaning this  test can be used) for the duration of  the COVID-19 declaration under Section  564(b)(1) of the Act, 21 U.S.C. section 360-bbb-3(b)(1), unless the authorization is terminated or revoked sooner.  Performed at [facility], [place] [address] [place], [place] [address]      Time coordinating discharge: Over 30 minutes  SIGNED:   Little Ishikawa, DO [facility] 09/05/2019, 1:23 PM Pager   If 7PM-7AM, please contact night-coverage [email or web address]

## 2019-09-05 NOTE — TOC Transition Note (Addendum)
Transition of Care [facility]) - CM/SW Discharge Note   Patient Details  Name: Kathy Pierce MRN: [id number] Date of Birth: Jun 27, 1925  Transition of Care [facility] CM/SW Contact:  Ross Ludwig, LCSW Phone Number: 09/05/2019, 1:31 PM   Clinical Narrative:     CSW received insurance authorization for patient to go to SNF for short term rehab.  Authorization number [id number], next review 09/07/19.  CSW contacted [facility], they are able to accept patient today.  CSW updated bedside nurse and physician.  Patient to be d/c'ed today to [facility].  Patient and family agreeable to plans will transport via ems RN to call report to [phone].  CSW spoke to patient's daughter and she is aware that patient is discharging today.   Final next level of care: Skilled Nursing Facility Barriers to Discharge: Barriers Resolved   Patient Goals and CMS Choice Patient states their goals for this hospitalization and ongoing recovery are:: To go to SNF for short term rehab, then return back home with home health services. CMS Medicare.gov Compare Post Acute Care list provided to:: Patient Represenative (must comment) Choice offered to / list presented to : Adult Children, Patient  Discharge Placement   Existing PASRR number confirmed : 08/31/19          Patient chooses bed at: [facility] Patient to be transferred to facility by: [facility] Name of family member notified: Patient's daughter [name] [phone] Patient and family notified of of transfer: 09/05/19  Discharge Plan and Services                                     Social Determinants of Health (SDOH) Interventions     Readmission Risk Interventions Readmission Risk Prevention Plan 09/02/2019 07/29/2019  Transportation Screening Complete Complete  Home Care Screening - Complete  Medication Review (RN CM) - Complete  Medication Review Press photographer) Referral to Pharmacy -  PCP or Specialist appointment  within 3-5 days of discharge Complete -  [place] or Home Care Consult Complete -  SW Recovery Care/Counseling Consult Complete -  Palliative Care Screening Not Applicable -  Skilled Nursing Facility Complete -  Some recent data might be hidden

## 2019-09-06 ENCOUNTER — Encounter: Payer: Self-pay | Admitting: General Practice

## 2019-09-07 DIAGNOSIS — J9621 Acute and chronic respiratory failure with hypoxia: Secondary | ICD-10-CM | POA: Diagnosis not present

## 2019-09-07 DIAGNOSIS — U071 COVID-19: Secondary | ICD-10-CM | POA: Diagnosis not present

## 2019-09-07 DIAGNOSIS — S72002A Fracture of unspecified part of neck of left femur, initial encounter for closed fracture: Secondary | ICD-10-CM | POA: Diagnosis not present

## 2019-09-07 DIAGNOSIS — I5033 Acute on chronic diastolic (congestive) heart failure: Secondary | ICD-10-CM | POA: Diagnosis not present

## 2019-09-12 ENCOUNTER — Ambulatory Visit: Payer: Medicare Other | Admitting: Internal Medicine

## 2019-09-12 DIAGNOSIS — R5381 Other malaise: Secondary | ICD-10-CM | POA: Diagnosis not present

## 2019-09-12 DIAGNOSIS — U071 COVID-19: Secondary | ICD-10-CM | POA: Diagnosis not present

## 2019-09-12 DIAGNOSIS — J449 Chronic obstructive pulmonary disease, unspecified: Secondary | ICD-10-CM | POA: Diagnosis not present

## 2019-09-12 DIAGNOSIS — D473 Essential (hemorrhagic) thrombocythemia: Secondary | ICD-10-CM | POA: Diagnosis not present

## 2019-09-15 DIAGNOSIS — J449 Chronic obstructive pulmonary disease, unspecified: Secondary | ICD-10-CM | POA: Diagnosis not present

## 2019-09-15 DIAGNOSIS — U071 COVID-19: Secondary | ICD-10-CM | POA: Diagnosis not present

## 2019-09-19 DIAGNOSIS — L97921 Non-pressure chronic ulcer of unspecified part of left lower leg limited to breakdown of skin: Secondary | ICD-10-CM | POA: Diagnosis not present

## 2019-09-19 DIAGNOSIS — M6281 Muscle weakness (generalized): Secondary | ICD-10-CM | POA: Diagnosis not present

## 2019-09-19 DIAGNOSIS — R488 Other symbolic dysfunctions: Secondary | ICD-10-CM | POA: Diagnosis not present

## 2019-09-19 DIAGNOSIS — R2681 Unsteadiness on feet: Secondary | ICD-10-CM | POA: Diagnosis not present

## 2019-09-19 DIAGNOSIS — R41841 Cognitive communication deficit: Secondary | ICD-10-CM | POA: Diagnosis not present

## 2019-09-19 DIAGNOSIS — M48061 Spinal stenosis, lumbar region without neurogenic claudication: Secondary | ICD-10-CM | POA: Diagnosis not present

## 2019-09-20 DIAGNOSIS — R2681 Unsteadiness on feet: Secondary | ICD-10-CM | POA: Diagnosis not present

## 2019-09-20 DIAGNOSIS — M48061 Spinal stenosis, lumbar region without neurogenic claudication: Secondary | ICD-10-CM | POA: Diagnosis not present

## 2019-09-20 DIAGNOSIS — M6281 Muscle weakness (generalized): Secondary | ICD-10-CM | POA: Diagnosis not present

## 2019-09-20 DIAGNOSIS — L97921 Non-pressure chronic ulcer of unspecified part of left lower leg limited to breakdown of skin: Secondary | ICD-10-CM | POA: Diagnosis not present

## 2019-09-22 DIAGNOSIS — I110 Hypertensive heart disease with heart failure: Secondary | ICD-10-CM | POA: Diagnosis not present

## 2019-09-22 DIAGNOSIS — D649 Anemia, unspecified: Secondary | ICD-10-CM | POA: Diagnosis not present

## 2019-09-22 DIAGNOSIS — M80052D Age-related osteoporosis with current pathological fracture, left femur, subsequent encounter for fracture with routine healing: Secondary | ICD-10-CM | POA: Diagnosis not present

## 2019-09-22 DIAGNOSIS — L97822 Non-pressure chronic ulcer of other part of left lower leg with fat layer exposed: Secondary | ICD-10-CM | POA: Diagnosis not present

## 2019-09-22 DIAGNOSIS — I872 Venous insufficiency (chronic) (peripheral): Secondary | ICD-10-CM | POA: Diagnosis not present

## 2019-09-27 ENCOUNTER — Other Ambulatory Visit: Payer: Self-pay | Admitting: Orthopaedic Surgery

## 2019-09-27 DIAGNOSIS — M898X5 Other specified disorders of bone, thigh: Secondary | ICD-10-CM

## 2019-09-27 DIAGNOSIS — S72142D Displaced intertrochanteric fracture of left femur, subsequent encounter for closed fracture with routine healing: Secondary | ICD-10-CM | POA: Diagnosis not present

## 2019-10-07 ENCOUNTER — Other Ambulatory Visit: Payer: Medicare Other

## 2019-10-10 ENCOUNTER — Telehealth: Payer: Self-pay | Admitting: Pulmonary Disease

## 2019-10-17 ENCOUNTER — Ambulatory Visit: Payer: Medicare Other | Admitting: Internal Medicine

## 2019-10-19 ENCOUNTER — Other Ambulatory Visit: Payer: Self-pay

## 2019-10-19 ENCOUNTER — Ambulatory Visit
Admission: RE | Admit: 2019-10-19 | Discharge: 2019-10-19 | Disposition: A | Discharge status: Home / Self Care | Payer: Medicare Other | Source: Ambulatory Visit | Attending: Orthopaedic Surgery | Admitting: Orthopaedic Surgery

## 2019-10-19 DIAGNOSIS — Z8781 Personal history of (healed) traumatic fracture: Secondary | ICD-10-CM | POA: Diagnosis not present

## 2019-10-19 DIAGNOSIS — S82002A Unspecified fracture of left patella, initial encounter for closed fracture: Secondary | ICD-10-CM | POA: Diagnosis not present

## 2019-10-19 DIAGNOSIS — S72142D Displaced intertrochanteric fracture of left femur, subsequent encounter for closed fracture with routine healing: Secondary | ICD-10-CM | POA: Diagnosis not present

## 2019-10-19 DIAGNOSIS — M898X5 Other specified disorders of bone, thigh: Secondary | ICD-10-CM

## 2019-10-19 DIAGNOSIS — Z8616 Personal history of COVID-19: Secondary | ICD-10-CM | POA: Diagnosis not present

## 2019-10-19 DIAGNOSIS — M25462 Effusion, left knee: Secondary | ICD-10-CM | POA: Diagnosis not present

## 2019-10-19 DIAGNOSIS — S72002A Fracture of unspecified part of neck of left femur, initial encounter for closed fracture: Secondary | ICD-10-CM | POA: Diagnosis not present

## 2019-10-19 DIAGNOSIS — J449 Chronic obstructive pulmonary disease, unspecified: Secondary | ICD-10-CM | POA: Diagnosis not present

## 2019-10-19 DIAGNOSIS — L97909 Non-pressure chronic ulcer of unspecified part of unspecified lower leg with unspecified severity: Secondary | ICD-10-CM | POA: Diagnosis not present

## 2019-10-20 DIAGNOSIS — S81802D Unspecified open wound, left lower leg, subsequent encounter: Secondary | ICD-10-CM | POA: Diagnosis not present

## 2019-10-20 DIAGNOSIS — Z08 Encounter for follow-up examination after completed treatment for malignant neoplasm: Secondary | ICD-10-CM | POA: Diagnosis not present

## 2019-10-26 ENCOUNTER — Ambulatory Visit: Payer: Medicare Other | Admitting: Cardiology

## 2019-10-31 ENCOUNTER — Telehealth: Payer: Self-pay | Admitting: Medical Oncology

## 2019-10-31 DIAGNOSIS — J449 Chronic obstructive pulmonary disease, unspecified: Secondary | ICD-10-CM | POA: Diagnosis not present

## 2019-10-31 DIAGNOSIS — I509 Heart failure, unspecified: Secondary | ICD-10-CM | POA: Diagnosis not present

## 2019-10-31 NOTE — Telephone Encounter (Signed)
dtr requested to cancel 10/22 appt . Pt unable to walk and has a lot of appts for other doctors.Schedule message sent to r/s.

## 2019-11-02 ENCOUNTER — Ambulatory Visit (INDEPENDENT_AMBULATORY_CARE_PROVIDER_SITE_OTHER): Payer: Medicare Other | Admitting: Primary Care

## 2019-11-02 ENCOUNTER — Encounter: Payer: Self-pay | Admitting: Primary Care

## 2019-11-02 ENCOUNTER — Ambulatory Visit (INDEPENDENT_AMBULATORY_CARE_PROVIDER_SITE_OTHER): Payer: Medicare Other

## 2019-11-02 ENCOUNTER — Other Ambulatory Visit: Payer: Self-pay

## 2019-11-02 VITALS — BP 120/74 | HR 82 | Temp 97.5°F | Ht 64.0 in | Wt 114.0 lb

## 2019-11-02 DIAGNOSIS — I517 Cardiomegaly: Secondary | ICD-10-CM | POA: Diagnosis not present

## 2019-11-02 DIAGNOSIS — J90 Pleural effusion, not elsewhere classified: Secondary | ICD-10-CM | POA: Diagnosis not present

## 2019-11-02 DIAGNOSIS — R0602 Shortness of breath: Secondary | ICD-10-CM | POA: Diagnosis not present

## 2019-11-02 DIAGNOSIS — C3490 Malignant neoplasm of unspecified part of unspecified bronchus or lung: Secondary | ICD-10-CM

## 2019-11-02 DIAGNOSIS — J449 Chronic obstructive pulmonary disease, unspecified: Secondary | ICD-10-CM

## 2019-11-02 DIAGNOSIS — I5033 Acute on chronic diastolic (congestive) heart failure: Secondary | ICD-10-CM | POA: Diagnosis not present

## 2019-11-02 DIAGNOSIS — J9611 Chronic respiratory failure with hypoxia: Secondary | ICD-10-CM

## 2019-11-02 DIAGNOSIS — I509 Heart failure, unspecified: Secondary | ICD-10-CM | POA: Diagnosis not present

## 2019-11-02 NOTE — Assessment & Plan Note (Signed)
-   No signs of exacerbation. Lungs were clear on exam without rhonchi or wheezing.  - Continue Anoro Ellipta 1 puff daily  - Encourage Incentive spirometry twice daily

## 2019-11-02 NOTE — Progress Notes (Signed)
@Patient  ID: [name], female    DOB: August 14, 1925, 86 y.o.   MRN: [id number]  Chief Complaint  Patient presents with  . Follow-up    Pt has had problems with heart failure and then was placed on O2 while at the hospital. Pt was diagnosed with covid around 8/14 while in rehab facility and was admitted to the hospital due to covid. Pt has had a lot of edema in legs especially the left leg.    Referring provider: London Pepper, MD  HPI: 86 year old female, former smoker quit 1978 (21-pack-year history).  Past medical history significant for hypertension, congestive heart failure, chronic obstructive pulmonary disease, lung cancer, chronic respiratory failure O2 dependent, COVID-15 September 2019, osteoporosis, depression.  Patient of Dr. Vaughan Browner, last seen in office on 06/07/2019.  Maintained on Anoro Ellipta.  11/02/2019- Interim hx Patient presents today for regular follow-up for COPD/chronic respiratory failure. She was hospitalized in August from 08/30/19-09/05/19 for acute on chronic diastolic heart failure and COVID-19 infection.  She was admitted from skilled nursing facility with worsening shortness of breath and acute on chronic respiratory failure with hypoxia, she was found to be Covid positive.  WBC 11.6, proBNP 236, chest x-ray showed pulmonary congestion with subsequent CTA chest showing no pulmonary embolism but did have bilateral pleural effusions with interstitial and airspace infiltrates, likely edema.  She received 20 mg of Lasix x1, transition to oral lasix 20 mg daily.  She completed course of remdesivir and she was discharged on Decadron.   Patient is accompanied by her daughter. She reports having a lot of lower extremity edema. Left leg is weeping fluid, nursing facility started compression wrap to LE two days ago. She has a chronic cough. She is compliant with taking lasix and Anoro once daily in the morning. She uses flutter valve once daily that she feels helps with congested.  She is on chronic oxygen at 3L. O2 88% on RA. O2 91-96% on 3L pulsed, saturation did not drop when patient stood today in office. She is not extremely mobile. Unable to walk in office today. She is getting physical therapy at nursing facility twice a week. Denies chest tightness or wheezing.    Allergies  Allergen Reactions  . Penicillins Hives and Rash    Did it involve swelling of the face/tongue/throat, SOB, or low BP? N Did it involve sudden or severe rash/hives, skin peeling, or any reaction on the inside of your mouth or nose? Y Did you need to seek medical attention at a hospital or doctor's office? N When did it last happen?Over 36 Years Ago If all above answers are "NO", may proceed with cephalosporin use.    . Codeine Nausea And Vomiting  . Erythromycin Nausea And Vomiting  . Sulfa Drugs Cross Reactors Nausea And Vomiting    Immunization History  Administered Date(s) Administered  . Influenza Split 11/23/2006, 12/15/2007, 11/30/2008, 11/26/2010, 12/04/2011, 11/19/2012  . Influenza, High Dose Seasonal PF 11/02/2017, 10/12/2019  . Influenza,inj,Quad PF,6+ Mos 10/31/2015  . Influenza,inj,quad, With Preservative 09/29/2013, 10/19/2014  . Influenza-Unspecified 11/14/2015  . PFIZER SARS-COV-2 Vaccination 02/10/2019, 03/03/2019  . Pneumococcal Conjugate-13 09/29/2013  . Pneumococcal Polysaccharide-23 11/20/2001  . Td 01/04/2001  . Tdap 08/21/2010  . Zoster 06/17/2006    Past Medical History:  Diagnosis Date  . Cancer (HCC)    Squamous cell carcinoma, skin, basal cell carcinoma  . Complication of anesthesia    wakes up slowly   . COPD (chronic obstructive pulmonary disease) ([place])   . Depression   .  Dyspnea   . Essential thrombocythemia ([place])   . Hypertension   . Lung cancer ([place]) dx'd 2017  . Osteoporosis   . Sciatica of left side 06/16/2012  . Spinal stenosis of lumbar region 06/16/2012  . Vitamin B12 deficiency     Tobacco History: Social History    Tobacco Use  Smoking Status Former Smoker  . Packs/day: 1.00  . Years: 21.00  . Pack years: 21.00  . Types: Cigarettes  . Quit date: 07/10/1976  . Years since quitting: 43.3  Smokeless Tobacco Never Used   Counseling given: Not Answered   Outpatient Medications Prior to Visit  Medication Sig Dispense Refill  . albuterol (PROVENTIL HFA;VENTOLIN HFA) 108 (90 Base) MCG/ACT inhaler Inhale 2 puffs into the lungs every 6 (six) hours as needed for wheezing or shortness of breath.    Marland Kitchen aspirin 81 MG tablet Take 81 mg by mouth daily.    Marland Kitchen atenolol (TENORMIN) 25 MG tablet Take 25 mg by mouth daily.     . calcium carbonate (OS-CAL - DOSED IN MG OF ELEMENTAL CALCIUM) 1250 MG tablet Take 1 tablet by mouth every evening.     . cyanocobalamin (,VITAMIN B-12,) 1000 MCG/ML injection Inject 1,000 mcg into the muscle every 30 (thirty) days.    Marland Kitchen docusate sodium (COLACE) 100 MG capsule Take 1 capsule (100 mg total) by mouth 2 (two) times daily. 10 capsule 0  . ferrous sulfate 325 (65 FE) MG tablet Take 325 mg by mouth daily with breakfast.    . folic acid (FOLVITE) 1 MG tablet Take 1 tablet (1 mg total) by mouth daily. 30 tablet 0  . furosemide (LASIX) 20 MG tablet Take 1 tablet (20 mg total) by mouth daily. 30 tablet 0  . hydroxyurea (HYDREA) 500 MG capsule Take 500-1,000 mg by mouth every other day. May take with food to minimize GI side effects. Alternate Take 1 capsule (500 mg) and Take 2 capsules (1000 mg) Other Day    . Hypromellose (ARTIFICIAL TEARS OP) Place 1 drop into both eyes as needed (for dry eyes).    Marland Kitchen levothyroxine (SYNTHROID) 25 MCG tablet Take 25 mcg by mouth daily.    . meclizine (ANTIVERT) 25 MG tablet Take 25 mg by mouth 3 (three) times daily as needed for dizziness.    . Multiple Vitamins-Minerals (PRESERVISION AREDS PO) Take 1 tablet by mouth daily.    Marland Kitchen omeprazole (PRILOSEC) 20 MG capsule Take 20 mg by mouth every morning.     Marland Kitchen oxyCODONE (OXY IR/ROXICODONE) 5 MG immediate  release tablet Take 1/2 to 1 pill every 6 hrs as needed for pain 20 tablet 0  . OXYGEN Inhale 3 L into the lungs.    . sertraline (ZOLOFT) 50 MG tablet Take 50 mg by mouth daily.    Marland Kitchen umeclidinium-vilanterol (ANORO ELLIPTA) 62.5-25 MCG/INH AEPB Inhale 1 puff into the lungs daily. 3 each 1  . enoxaparin (LOVENOX) 40 MG/0.4ML injection Inject 0.4 mLs (40 mg total) into the skin daily. For DVT prophylaxis after surgery 12 mL 0  . ferrous sulfate 325 (65 FE) MG tablet Take 1 tablet (325 mg total) by mouth daily with breakfast. 30 tablet 0  . benzonatate (TESSALON) 100 MG capsule Take 100 mg by mouth 3 (three) times daily as needed for cough.     . polyethylene glycol (MIRALAX / GLYCOLAX) 17 g packet Take 17 g by mouth daily. 14 each 0  . sertraline (ZOLOFT) 100 MG tablet Take 50 mg by mouth daily.  No facility-administered medications prior to visit.   Review of Systems  Review of Systems  Constitutional: Negative for fever.  Respiratory: Positive for shortness of breath. Negative for cough and chest tightness.   Cardiovascular: Positive for leg swelling.   Physical Exam  BP 120/74 (BP Location: Right Arm, Cuff Size: Normal)   Pulse 82   Temp (!) 97.5 F (36.4 C) (Other (Comment)) Comment (Src): wrist  Ht 5\' 4"  (1.626 m)   Wt 114 lb (51.7 kg)   SpO2 100%   BMI 19.57 kg/m  Physical Exam Constitutional:      Appearance: Normal appearance.  HENT:     Head: Normocephalic and atraumatic.  Cardiovascular:     Rate and Rhythm: Normal rate and regular rhythm.     Comments: +2 BLE edema, weeping (LLE wrapped) Pulmonary:     Comments: Lungs clear, diminished; O2 91-96% 3L POC Musculoskeletal:     Comments: In WC, unable to ambulate without walker  Neurological:     Mental Status: She is alert.  Psychiatric:        Mood and Affect: Mood normal.        Behavior: Behavior normal.        Thought Content: Thought content normal.        Judgment: Judgment normal.      Lab  Results:  CBC    Component Value Date/Time   WBC 7.2 09/04/2019 0335   RBC 3.18 (L) 09/04/2019 0335   HGB 11.2 (L) 09/04/2019 0335   HGB 14.4 11/19/2018 1340   HGB 13.0 11/05/2016 1031   HCT 36.2 09/04/2019 0335   HCT 39.2 11/05/2016 1031   PLT 858 (H) 09/04/2019 0335   PLT 538 (H) 11/19/2018 1340   PLT 362 11/05/2016 1031   MCV 113.8 (H) 09/04/2019 0335   MCV 110.7 (H) 11/05/2016 1031   MCH 35.2 (H) 09/04/2019 0335   MCHC 30.9 09/04/2019 0335   RDW 17.9 (H) 09/04/2019 0335   RDW 15.5 (H) 11/05/2016 1031   LYMPHSABS 0.7 09/04/2019 0335   LYMPHSABS 0.4 (L) 11/05/2016 1031   MONOABS 1.3 (H) 09/04/2019 0335   MONOABS 1.6 (H) 11/05/2016 1031   EOSABS 0.1 09/04/2019 0335   EOSABS 0.2 11/05/2016 1031   BASOSABS 0.0 09/04/2019 0335   BASOSABS 0.0 11/05/2016 1031    BMET    Component Value Date/Time   NA 138 09/04/2019 0335   NA 137 11/05/2016 1031   K 4.3 09/04/2019 0335   K 4.4 11/05/2016 1031   CL 100 09/04/2019 0335   CO2 26 09/04/2019 0335   CO2 28 11/05/2016 1031   GLUCOSE 94 09/04/2019 0335   GLUCOSE 93 11/05/2016 1031   BUN 24 (H) 09/04/2019 0335   BUN 19.1 11/05/2016 1031   CREATININE 0.59 09/04/2019 0335   CREATININE 0.74 11/19/2018 1340   CREATININE 0.7 11/05/2016 1031   CALCIUM 8.5 (L) 09/04/2019 0335   CALCIUM 9.4 11/05/2016 1031   GFRNONAA >60 09/04/2019 0335   GFRNONAA >60 11/19/2018 1340   GFRAA >60 09/04/2019 0335   GFRAA >60 11/19/2018 1340    BNP    Component Value Date/Time   BNP 236.5 (H) 08/30/2019 0310    ProBNP No results found for: PROBNP  Imaging: DG Chest 2 View  Result Date: 11/02/2019 CLINICAL DATA:  Shortness of breath.  History of CHF and COVID. EXAM: CHEST - 2 VIEW COMPARISON:  Chest x-ray and CTA chest dated August 30, 2019. FINDINGS: Unchanged mild cardiomegaly. Increased diffuse interstitial thickening. Unchanged  small left pleural effusion. Similar scarring and postsurgical changes in the left lung. No pneumothorax. No  acute osseous abnormality. IMPRESSION: 1. Progressive interstitial thickening, favored to reflect pulmonary edema in the setting of a small left pleural effusion. Atypical infection, including viral pneumonia, could have a similar appearance. Electronically Signed   By: Titus Dubin M.D.   On: 11/02/2019 15:09   CT FEMUR LEFT WO CONTRAST  Result Date: 10/20/2019 CLINICAL DATA:  Proximal left femur fracture status post ORIF 08/11/2019 EXAM: CT OF THE LOWER LEFT EXTREMITY WITHOUT CONTRAST TECHNIQUE: Multidetector CT imaging of the lower left extremity was performed according to the standard protocol. COMPARISON:  X-ray 08/11/2019 FINDINGS: Bones/Joint/Cartilage Status post ORIF of comminuted left intertrochanteric fracture with anterograde long intramedullary rod with proximal lag screws and single distal interlocking screw. Proximal femoral fracture alignment is not appear significantly changed compared to the prior study. There is bridging callus formation across the fracture site. Lesser trochanteric fragment remains medially displaced with bridging callus formation. Left hip joint alignment is maintained without dislocation. Left hip joint space is preserved. Subacute to chronic appearing mildly displaced spiral fracture of the left femur more distally. Fracture extends from the level of the mid femoral diaphysis anteriorly to the distal metaphysis closely approximating the superior most aspect of the lateral trochlea. Partial bridging callus formation across the fracture site. Nondisplaced vertically oriented fracture of the lateral aspect of the patella with intra-articular extension to the knee joint (series 7, images 19-20; series 3, image 203). Mild tricompartmental osteoarthritis of the left knee with chondrocalcinosis. Visualized tibial plateau is intact without evidence of fracture. Moderate-sized knee joint effusion without fat-fluid level. Ligaments Suboptimally assessed by CT. Muscles and Tendons  Mild diffuse muscle atrophy. No evidence of acute tendinous abnormality by CT. Soft tissues Mild diffuse anasarca. No well-defined soft tissue hematoma. No left inguinal lymphadenopathy. Partially visualized sigmoid diverticulosis. IMPRESSION: 1. Status post ORIF of comminuted left intertrochanteric fracture with evidence of interval healing. 2. Subacute-to-chronic appearing mildly displaced spiral fracture of the left femur more distally extending from the mid diaphysis to the distal metaphysis. Finding is new from previous study. 3. Nondisplaced vertically oriented fracture of the patella with intra-articular extension to the knee joint, also new from prior. 4. Moderate-sized knee joint effusion. These results will be called to the ordering clinician or representative by the Radiologist Assistant, and communication documented in the PACS or [employer]. Electronically Signed   By: Davina Poke D.O.   On: 10/20/2019 09:05     Assessment & Plan:   Chronic respiratory failure with hypoxia (HCC) - O2 dependent 3L/min. Oxygen level on room air was 88%; on 3L pulsed her O2 level ranged from 91-96%. She did not desaturate upon standing today. Unable to perform ambulatory walk today.   Congestive heart failure (CHF) (HCC) - Mild-moderate dyspnea. Appear fluid overloaded today on exam. She has +2-3 pitting LE edema, weeping. CXR showed increased interstitial thickening, favoring pulmonary edema with small left pleural effusion. Checking BNP and BMET. Continue lasix 20mg  daily, will likely need to adjust. Recommending compression wraps to bilateral lower extremities during the day, elevate legs as much as possible. She seeing cardiology on October 19th.   Chronic obstructive pulmonary disease (HCC) - No signs of exacerbation. Lungs were clear on exam without rhonchi or wheezing.  - Continue Anoro Ellipta 1 puff daily  - Encourage Incentive spirometry twice daily   Martyn Ehrich,  NP 11/02/2019

## 2019-11-02 NOTE — Patient Instructions (Addendum)
Recommendations: - Compression wraps to bilateral lower extremities during the day, elevate legs as much as possible  - Continue lasix 20mg  daily - Continue Anoro 1 puff daily - Keep apt with Cardiology on Oct 19th   Orders: - Labs and CXR today   Follow-up: - 6-8 weeks with Dr. Vaughan Browner

## 2019-11-02 NOTE — Assessment & Plan Note (Addendum)
-   O2 dependent 3L/min. Oxygen level on room air was 88%; on 3L pulsed her O2 level ranged from 91-96%. She did not desaturate upon standing today. Unable to perform ambulatory walk today.

## 2019-11-02 NOTE — Assessment & Plan Note (Addendum)
-   Mild-moderate dyspnea. Appear fluid overloaded today on exam. She has +2-3 pitting LE edema, weeping. CXR showed increased interstitial thickening, favoring pulmonary edema with small left pleural effusion. Checking BNP and BMET. Continue lasix 20mg  daily, will likely need to adjust. Recommending compression wraps to bilateral lower extremities during the day, elevate legs as much as possible. She seeing cardiology on October 19th.

## 2019-11-03 ENCOUNTER — Other Ambulatory Visit (INDEPENDENT_AMBULATORY_CARE_PROVIDER_SITE_OTHER): Payer: Medicare Other

## 2019-11-03 DIAGNOSIS — R0602 Shortness of breath: Secondary | ICD-10-CM

## 2019-11-03 LAB — BASIC METABOLIC PANEL
BUN: 12 mg/dL (ref 6–23)
CO2: 34 mEq/L — ABNORMAL HIGH (ref 19–32)
Calcium: 9.3 mg/dL (ref 8.4–10.5)
Chloride: 97 mEq/L (ref 96–112)
Creatinine, Ser: 0.6 mg/dL (ref 0.40–1.20)
GFR: 79.24 mL/min (ref >60.00)
Glucose, Bld: 104 mg/dL — ABNORMAL HIGH (ref 70–99)
Potassium: 4.1 mEq/L (ref 3.5–5.1)
Sodium: 137 mEq/L (ref 135–145)

## 2019-11-03 LAB — BRAIN NATRIURETIC PEPTIDE: Pro B Natriuretic peptide (BNP): 227 pg/mL — ABNORMAL HIGH (ref 0.0–100.0)

## 2019-11-09 ENCOUNTER — Ambulatory Visit: Payer: Medicare Other | Admitting: Cardiology

## 2019-11-10 ENCOUNTER — Ambulatory Visit: Payer: Medicare Other | Admitting: Cardiology

## 2019-11-10 ENCOUNTER — Ambulatory Visit: Payer: Medicare Other | Admitting: Internal Medicine

## 2019-11-15 ENCOUNTER — Other Ambulatory Visit: Payer: Self-pay

## 2019-11-15 ENCOUNTER — Encounter: Payer: Self-pay | Admitting: Cardiology

## 2019-11-15 ENCOUNTER — Ambulatory Visit (INDEPENDENT_AMBULATORY_CARE_PROVIDER_SITE_OTHER): Payer: Medicare Other | Admitting: Cardiology

## 2019-11-15 ENCOUNTER — Telehealth: Payer: Self-pay | Admitting: Cardiology

## 2019-11-15 VITALS — BP 114/76 | HR 84 | Ht 64.0 in | Wt 114.0 lb

## 2019-11-15 DIAGNOSIS — R600 Localized edema: Secondary | ICD-10-CM

## 2019-11-15 DIAGNOSIS — R0602 Shortness of breath: Secondary | ICD-10-CM | POA: Diagnosis not present

## 2019-11-15 DIAGNOSIS — I5032 Chronic diastolic (congestive) heart failure: Secondary | ICD-10-CM | POA: Diagnosis not present

## 2019-11-15 DIAGNOSIS — J9611 Chronic respiratory failure with hypoxia: Secondary | ICD-10-CM

## 2019-11-15 DIAGNOSIS — I872 Venous insufficiency (chronic) (peripheral): Secondary | ICD-10-CM

## 2019-11-15 DIAGNOSIS — J449 Chronic obstructive pulmonary disease, unspecified: Secondary | ICD-10-CM

## 2019-11-15 DIAGNOSIS — Z7189 Other specified counseling: Secondary | ICD-10-CM

## 2019-11-15 DIAGNOSIS — Z8616 Personal history of COVID-19: Secondary | ICD-10-CM

## 2019-11-15 NOTE — Progress Notes (Signed)
Cardiology Office Note:    Date:  11/15/2019   ID:  Kathy Pierce, DOB 1925/11/20, MRN [id number]  PCP:  Kathy Pepper, MD  Cardiologist:  Kathy Dresser, MD  Referring MD: Kathy Pepper, MD   CC: new patient evaluation for heart failure  History of Present Illness:    Kathy Pierce is a 86 y.o. female with a hx of hypertension, COPD, chronic hypoxic respiratory failure on home O2, history of lung cancer, who is seen as a new consult at the request of Kathy Pepper, MD for the evaluation and management of heart failure.  I reviewed her hospital admission and discharge on 09/05/19. She was found to be Covid positive, with bilateral pleural effusions and pulmonary edema. She did not have a new echo at that time, but prior echo 07/27/19 as below and notable for EF 81-44%, grade 2 diastolic dysfunction with elevated LVEDP, RVSP 48.  I also reviewed recent note from Dr. Orland Pierce dated 08/08/19. Had been admitted 07/26/19-07/29/19, started on lasix and as needed oxygen at that time. She was referred to cardiology at that time, but she was readmitted to the hospital before she could be seen by outpatient cardiology.  I reviewed the discharge summary from 07/29/19 as well. Noted to have severe shortness of breath and LE edema, BNP 350.  Today: Here with her daughter Kathy Pierce today.   Daughter's concerns today: has several months of LE edema. Doubling the lasix dose helped some but not fully resolved. Also worried if her kidneys have been affected. Had a coughing spell yesterday, had a nosebleed after. Had some mucus with it as well, is this related to her heart.    Past Medical History:  Diagnosis Date  . Cancer (HCC)    Squamous cell carcinoma, skin, basal cell carcinoma  . Complication of anesthesia    wakes up slowly   . COPD (chronic obstructive pulmonary disease) ([place])   . Depression   . Dyspnea   . Essential thrombocythemia ([place])   . Hypertension   . Lung cancer ([place]) dx'd 2017   . Osteoporosis   . Sciatica of left side 06/16/2012  . Spinal stenosis of lumbar region 06/16/2012  . Vitamin B12 deficiency     Past Surgical History:  Procedure Laterality Date  . APPENDECTOMY    . CATARACT EXTRACTION, BILATERAL Bilateral   . INTRAMEDULLARY (IM) NAIL INTERTROCHANTERIC Left 08/11/2019   Procedure: INTRAMEDULLARY (IM) NAIL INTERTROCHANTRIC;  Surgeon: Kathy Gash, MD;  Location: [facility];  Service: Orthopedics;  Laterality: Left;  . STAPEDECTOMY Right   . TONSILLECTOMY    . VIDEO BRONCHOSCOPY WITH ENDOBRONCHIAL NAVIGATION N/A 02/11/2016   Procedure: VIDEO BRONCHOSCOPY WITH ENDOBRONCHIAL NAVIGATION;  Surgeon: Kathy Isaac, MD;  Location: [facility];  Service: Thoracic;  Laterality: N/A;  . VIDEO BRONCHOSCOPY WITH ENDOBRONCHIAL ULTRASOUND N/A 02/11/2016   Procedure: VIDEO BRONCHOSCOPY WITH ENDOBRONCHIAL ULTRASOUND with biopsy of nodes #7 and #10;  Surgeon: Kathy Isaac, MD;  Location: [facility];  Service: Thoracic;  Laterality: N/A;    Current Medications: Current Outpatient Medications on File Prior to Visit  Medication Sig  . albuterol (PROVENTIL HFA;VENTOLIN HFA) 108 (90 Base) MCG/ACT inhaler Inhale 2 puffs into the lungs every 6 (six) hours as needed for wheezing or shortness of breath.  Marland Kitchen aspirin 81 MG tablet Take 81 mg by mouth daily.  Marland Kitchen atenolol (TENORMIN) 25 MG tablet Take 25 mg by mouth daily.   . calcium carbonate (OS-CAL - DOSED IN MG OF ELEMENTAL CALCIUM)  1250 MG tablet Take 1 tablet by mouth every evening.   . cyanocobalamin (,VITAMIN B-12,) 1000 MCG/ML injection Inject 1,000 mcg into the muscle every 30 (thirty) days.  Marland Kitchen docusate sodium (COLACE) 100 MG capsule Take 1 capsule (100 mg total) by mouth 2 (two) times daily.  Marland Kitchen enoxaparin (LOVENOX) 40 MG/0.4ML injection Inject 0.4 mLs (40 mg total) into the skin daily. For DVT prophylaxis after surgery  . ferrous sulfate 325 (65 FE) MG tablet Take 1 tablet (325 mg total) by mouth daily with breakfast.  . ferrous  sulfate 325 (65 FE) MG tablet Take 325 mg by mouth daily with breakfast.  . folic acid (FOLVITE) 1 MG tablet Take 1 tablet (1 mg total) by mouth daily.  . furosemide (LASIX) 20 MG tablet Take 1 tablet (20 mg total) by mouth daily.  . hydroxyurea (HYDREA) 500 MG capsule Take 500-1,000 mg by mouth every other day. May take with food to minimize GI side effects. Alternate Take 1 capsule (500 mg) and Take 2 capsules (1000 mg) Other Day  . Hypromellose (ARTIFICIAL TEARS OP) Place 1 drop into both eyes as needed (for dry eyes).  Marland Kitchen levothyroxine (SYNTHROID) 25 MCG tablet Take 25 mcg by mouth daily.  . meclizine (ANTIVERT) 25 MG tablet Take 25 mg by mouth 3 (three) times daily as needed for dizziness.  . Multiple Vitamins-Minerals (PRESERVISION AREDS PO) Take 1 tablet by mouth daily.  Marland Kitchen omeprazole (PRILOSEC) 20 MG capsule Take 20 mg by mouth every morning.   Marland Kitchen oxyCODONE (OXY IR/ROXICODONE) 5 MG immediate release tablet Take 1/2 to 1 pill every 6 hrs as needed for pain  . OXYGEN Inhale 3 L into the lungs.  . sertraline (ZOLOFT) 50 MG tablet Take 50 mg by mouth daily.  Marland Kitchen umeclidinium-vilanterol (ANORO ELLIPTA) 62.5-25 MCG/INH AEPB Inhale 1 puff into the lungs daily.   No current facility-administered medications on file prior to visit.     Allergies:   Penicillins, Codeine, Erythromycin, and Sulfa drugs cross reactors   Social History   Tobacco Use  . Smoking status: Former Smoker    Packs/day: 1.00    Years: 21.00    Pack years: 21.00    Types: Cigarettes    Quit date: 07/10/1976    Years since quitting: 43.3  . Smokeless tobacco: Never Used  Vaping Use  . Vaping Use: Never used  Substance Use Topics  . Alcohol use: No  . Drug use: No    Family History: family history includes Cancer in her brother, brother, daughter, mother, and sister; Heart attack in her father.  ROS:   Please see the history of present illness.  Additional pertinent ROS: Constitutional: Negative for chills, fever,  night sweats, unintentional weight loss  HENT: Negative for ear pain and hearing loss.   Eyes: Negative for loss of vision and eye pain.  Respiratory: Negative for cough, sputum, wheezing.   Cardiovascular: See HPI. Gastrointestinal: Negative for abdominal pain, melena, and hematochezia.  Genitourinary: Negative for dysuria and hematuria.  Musculoskeletal: Negative for falls and myalgias.  Skin: Negative for itching and rash.  Neurological: Negative for focal weakness, focal sensory changes and loss of consciousness.  Endo/Heme/Allergies: Does not bruise/bleed easily.     EKGs/Labs/Other Studies Reviewed:    The following studies were reviewed today: Echo 07/27/19 1. Left ventricular ejection fraction, by estimation, is 60 to 65%. The  left ventricle has normal function. The left ventricle has no regional  wall motion abnormalities. There is mild concentric left ventricular  hypertrophy. Left ventricular diastolic  parameters are consistent with Grade II diastolic dysfunction  (pseudonormalization). Elevated left ventricular end-diastolic pressure.  2. Right ventricular systolic function is normal. The right ventricular  size is normal. There is moderately elevated pulmonary artery systolic  pressure.  3. Left atrial size was severely dilated.  4. Right atrial size was mildly dilated.  5. The mitral valve is normal in structure. Trivial mitral valve  regurgitation. No evidence of mitral stenosis.  6. The aortic valve is tricuspid. Aortic valve regurgitation is trivial.  Mild aortic valve sclerosis is present, with no evidence of aortic valve  stenosis.  7. Aortic dilatation noted. There is mild dilatation of the ascending  aorta measuring 38 mm.  8. The inferior vena cava is dilated in size with >50% respiratory  variability, suggesting right atrial pressure of 8 mmHg.   EKG:  EKG is personally reviewed.  The ekg ordered today demonstrates sinus rhythm with PACs at 84  bpm  Recent Labs: 07/27/2019: TSH 1.929 08/12/2019: Magnesium 1.9 08/30/2019: B Natriuretic Peptide 236.5 09/04/2019: ALT 15; Hemoglobin 11.2; Platelets 858 11/03/2019: BUN 12; Creatinine, Ser 0.60; Potassium 4.1; Pro B Natriuretic peptide (BNP) 227.0; Sodium 137  Recent Lipid Panel    Component Value Date/Time   CHOL 122 07/27/2019 0118   TRIG 35 07/27/2019 0118   HDL 43 07/27/2019 0118   CHOLHDL 2.8 07/27/2019 0118   VLDL 7 07/27/2019 0118   LDLCALC 72 07/27/2019 0118    Physical Exam:    VS:  BP 114/76   Pulse 84   Ht 5\' 4"  (1.626 m)   Wt 114 lb (51.7 kg)   BMI 19.57 kg/m     Wt Readings from Last 3 Encounters:  11/15/19 114 lb (51.7 kg)  11/02/19 114 lb (51.7 kg)  09/04/19 112 lb 14.4 oz (51.2 kg)    GEN: Well nourished, well developed in no acute distress HEENT: Normal, moist mucous membranes NECK: No JVD CARDIAC: regular rhythm, normal S1 and S2, no rubs or gallops. No murmurs. VASCULAR: Radial and DP pulses 2+ bilaterally. No carotid bruits RESPIRATORY:  Clear to auscultation without rales, wheezing or rhonchi  ABDOMEN: Soft, non-tender, non-distended MUSCULOSKELETAL:  Ambulates independently SKIN: Warm and dry, no edema NEUROLOGIC:  Alert and oriented x 3. No focal neuro deficits noted. PSYCHIATRIC:  Normal affect     Palpable but faint bilateral pulses.  Continue 20 mg lasix  ASSESSMENT:    1. Chronic diastolic congestive heart failure ([place])   2. Shortness of breath   3. Bilateral leg edema   4. Encounter for education about heart failure   5. Venous insufficiency (chronic) (peripheral)   6. Chronic respiratory failure with hypoxia (HCC)   7. Chronic obstructive pulmonary disease, unspecified COPD type ([place])   8. Personal history of COVID-19    PLAN:    Two recent hospitalizations for acute diastolic heart failure, now with chronic diastolic heart failure Shortness of breath Lower extremity edema Heart failure education -we reviewed her testing and  recent hospitalizations at length today -we discussed the difference between systolic, diastolic, and right sided heart failure -we discussed how these types of heart failure are managed -she has no JVD and clear lungs today, with only trace pitting edema. Appears euvolemic. Would continue lasix 20 mg daily.  -we did extensive heart failure education. Daughter will speak to patient's facility to see how much assistance she can get -Do the following things EVERY DAY:  1) Weigh yourself EVERY morning after you go  to the bathroom but before you eat or drink anything. Write this number down in a weight log/diary. If you gain 3 pounds overnight or 5 pounds in a week, call the office.  2) Take your medicines as prescribed. If you have concerns about your medications, please call us before you stop taking them.   3) Eat low salt foods--Limit salt (sodium) to 2000 mg per day. This will help prevent your body from holding onto fluid. Read food labels as many processed foods have a lot of sodium, especially canned goods and prepackaged meats. If you would like some assistance choosing low sodium foods, we would be happy to set you up with a nutritionist.  4) Stay as active as you can everyday. Staying active will give you more energy and make your muscles stronger. Start with 5 minutes at a time and work your way up to 30 minutes a day. Break up your activities--do some in the morning and some in the afternoon. Start with 3 days per week and work your way up to 5 days as you can.  If you have chest pain, feel short of breath, dizzy, or lightheaded, STOP. If you don't feel better after a short rest, call 911. If you do feel better, call the office to let us know you have symptoms with exercise.  5) Limit all fluids for the day to less than 2 liters. Fluid includes all drinks, coffee, juice, ice chips, soup, jello, and all other liquids.  Chronic venous insufficiency: -we discussed this at length, including  reviewing images, of how this can contribute to LE edema -she currently has an Haematologist on the left. I would recommend being measured for bilateral compression stockings once her [place] boot is no longer needed -discussed compression, elevation, salt avoidance at length  COPD, chronic hypoxic respiratory failure on home O2, recent covid infection -lungs clear today -management per pulmonary team, last seen 11/02/19  Plan for follow up: 2 mos or sooner as needed  Total time of encounter: 62 minutes total time of encounter, including 38 minutes spent in face-to-face patient care. This time includes coordination of care and counseling regarding recent hospitalizations, test results, and multiple comorbid conditions. Remainder of non-face-to-face time involved reviewing chart documents/testing relevant to the patient encounter and documentation in the medical record.  Kathy Dresser, MD, PhD [place]  [facility]  Medication Adjustments/Labs and Tests Ordered: Current medicines are reviewed at length with the patient today.  Concerns regarding medicines are outlined above.  Orders Placed This Encounter  Procedures  . EKG 12-Lead   No orders of the defined types were placed in this encounter.   Patient Instructions  Medication Instructions:  Your Physician recommend you continue on your current medication as directed.     *If you need a refill on your cardiac medications before your next appointment, please call your pharmacy*   Lab Work: None ordered   Testing/Procedures: None ordered    Follow-Up: At [facility], you and your health needs are our priority.  As part of our continuing mission to provide you with exceptional heart care, we have created designated Provider Care Teams.  These Care Teams include your primary Cardiologist (physician) and Advanced Practice Providers (APPs -  Physician Assistants and Nurse Practitioners) who all work together to provide  you with the care you need, when you need it.  We recommend signing up for the patient portal called "MyChart".  Sign up information is provided on this After Visit  Summary.  MyChart is used to connect with patients for Virtual Visits (Telemedicine).  Patients are able to view lab/test results, encounter notes, upcoming appointments, etc.  Non-urgent messages can be sent to your provider as well.   To learn more about what you can do with MyChart, go to [email or web address].    Your next appointment:   2 month(s)  The format for your next appointment:   In Person  Provider:   Buford Dresser, MD  Do the following things EVERY DAY:  6) Weigh yourself EVERY morning after you go to the bathroom but before you eat or drink anything. Write this number down in a weight log/diary. If you gain 3 pounds overnight or 5 pounds in a week, call the office.  7) Take your medicines as prescribed. If you have concerns about your medications, please call us before you stop taking them.   8) Eat low salt foods--Limit salt (sodium) to 2000 mg per day. This will help prevent your body from holding onto fluid. Read food labels as many processed foods have a lot of sodium, especially canned goods and prepackaged meats. If you would like some assistance choosing low sodium foods, we would be happy to set you up with a nutritionist.  9) Stay as active as you can everyday. Staying active will give you more energy and make your muscles stronger. Start with 5 minutes at a time and work your way up to 30 minutes a day. Break up your activities--do some in the morning and some in the afternoon. Start with 3 days per week and work your way up to 5 days as you can.  If you have chest pain, feel short of breath, dizzy, or lightheaded, STOP. If you don't feel better after a short rest, call 911. If you do feel better, call the office to let us know you have symptoms with exercise.  10) Limit all fluids for the day  to less than 2 liters (about 64 ounces). Fluid includes all drinks, coffee, juice, ice chips, soup, jello, and all other liquids.    Signed, Kathy Dresser, MD PhD 11/15/2019 7:16 PM    [place]

## 2019-11-15 NOTE — Patient Instructions (Addendum)
Medication Instructions:  Your Physician recommend you continue on your current medication as directed.     *If you need a refill on your cardiac medications before your next appointment, please call your pharmacy*   Lab Work: None ordered   Testing/Procedures: None ordered    Follow-Up: At [facility], you and your health needs are our priority.  As part of our continuing mission to provide you with exceptional heart care, we have created designated Provider Care Teams.  These Care Teams include your primary Cardiologist (physician) and Advanced Practice Providers (APPs -  Physician Assistants and Nurse Practitioners) who all work together to provide you with the care you need, when you need it.  We recommend signing up for the patient portal called "MyChart".  Sign up information is provided on this After Visit Summary.  MyChart is used to connect with patients for Virtual Visits (Telemedicine).  Patients are able to view lab/test results, encounter notes, upcoming appointments, etc.  Non-urgent messages can be sent to your provider as well.   To learn more about what you can do with MyChart, go to [email or web address].    Your next appointment:   2 month(s)  The format for your next appointment:   In Person  Provider:   Buford Dresser, MD  Do the following things EVERY DAY:  1) Weigh yourself EVERY morning after you go to the bathroom but before you eat or drink anything. Write this number down in a weight log/diary. If you gain 3 pounds overnight or 5 pounds in a week, call the office.  2) Take your medicines as prescribed. If you have concerns about your medications, please call us before you stop taking them.   3) Eat low salt foods--Limit salt (sodium) to 2000 mg per day. This will help prevent your body from holding onto fluid. Read food labels as many processed foods have a lot of sodium, especially canned goods and prepackaged meats. If you would like some  assistance choosing low sodium foods, we would be happy to set you up with a nutritionist.  4) Stay as active as you can everyday. Staying active will give you more energy and make your muscles stronger. Start with 5 minutes at a time and work your way up to 30 minutes a day. Break up your activities--do some in the morning and some in the afternoon. Start with 3 days per week and work your way up to 5 days as you can.  If you have chest pain, feel short of breath, dizzy, or lightheaded, STOP. If you don't feel better after a short rest, call 911. If you do feel better, call the office to let us know you have symptoms with exercise.  5) Limit all fluids for the day to less than 2 liters (about 64 ounces). Fluid includes all drinks, coffee, juice, ice chips, soup, jello, and all other liquids.

## 2019-11-15 NOTE — Telephone Encounter (Signed)
Kathy Pierce from [facility] states they received an order for the patient to be weighed every day or 3-4 times a week. She states they need a specific amount of times to weigh her.

## 2019-11-15 NOTE — Telephone Encounter (Signed)
Returned call to [employer] at [facility] who states they need a new order from Dr. Harrell Gave with a specific order to weigh patient daily or to weigh patient a few days a week, the order must be specific. Please fax order to ATTN: Pharmacy @ [phone] and ATTN: Pamala Duffel, LPN @ [phone]

## 2019-11-21 ENCOUNTER — Other Ambulatory Visit: Payer: Medicare Other

## 2019-11-21 DIAGNOSIS — M80052D Age-related osteoporosis with current pathological fracture, left femur, subsequent encounter for fracture with routine healing: Secondary | ICD-10-CM | POA: Diagnosis not present

## 2019-11-21 DIAGNOSIS — L97822 Non-pressure chronic ulcer of other part of left lower leg with fat layer exposed: Secondary | ICD-10-CM | POA: Diagnosis not present

## 2019-11-21 DIAGNOSIS — I872 Venous insufficiency (chronic) (peripheral): Secondary | ICD-10-CM | POA: Diagnosis not present

## 2019-11-21 DIAGNOSIS — I110 Hypertensive heart disease with heart failure: Secondary | ICD-10-CM | POA: Diagnosis not present

## 2019-11-22 NOTE — Telephone Encounter (Signed)
Please place order for daily weights and fax back to above information. Thank you.

## 2019-11-23 ENCOUNTER — Ambulatory Visit: Payer: Medicare Other | Admitting: Internal Medicine

## 2019-11-23 NOTE — Telephone Encounter (Signed)
Daily weight orders faxed back to [facility].

## 2019-12-01 DIAGNOSIS — J449 Chronic obstructive pulmonary disease, unspecified: Secondary | ICD-10-CM | POA: Diagnosis not present

## 2019-12-01 DIAGNOSIS — I509 Heart failure, unspecified: Secondary | ICD-10-CM | POA: Diagnosis not present

## 2019-12-06 DIAGNOSIS — I509 Heart failure, unspecified: Secondary | ICD-10-CM | POA: Diagnosis not present

## 2019-12-06 DIAGNOSIS — R2689 Other abnormalities of gait and mobility: Secondary | ICD-10-CM | POA: Diagnosis not present

## 2019-12-15 ENCOUNTER — Telehealth: Payer: Self-pay | Admitting: Internal Medicine

## 2019-12-15 NOTE — Telephone Encounter (Signed)
Called pt per 11/18 sch msg :    Scheduling Message Entered by Ardeen Garland on 10/31/2019 at 2:12 PM Priority: Low EST PT 15 Department: [facility] Provider: Curt Bears, MD Appointment Notes: call dtr with new appt  Scheduling Notes: 11/25 -labs then [facility] a few days later.   Patients daughter does not want to schedule until next year. Patient is very difficult to transport around and has trouble breathing so its hard moving around with the oxygen . She says she will call in next year to set something up

## 2019-12-20 ENCOUNTER — Ambulatory Visit: Payer: Medicare Other | Admitting: Pulmonary Disease

## 2019-12-20 DIAGNOSIS — L97921 Non-pressure chronic ulcer of unspecified part of left lower leg limited to breakdown of skin: Secondary | ICD-10-CM | POA: Diagnosis not present

## 2019-12-20 DIAGNOSIS — Z4789 Encounter for other orthopedic aftercare: Secondary | ICD-10-CM | POA: Diagnosis not present

## 2019-12-20 DIAGNOSIS — S7292XD Unspecified fracture of left femur, subsequent encounter for closed fracture with routine healing: Secondary | ICD-10-CM | POA: Diagnosis not present

## 2019-12-20 DIAGNOSIS — M6281 Muscle weakness (generalized): Secondary | ICD-10-CM | POA: Diagnosis not present

## 2019-12-27 ENCOUNTER — Encounter: Payer: Medicare Other | Admitting: Sports Medicine

## 2019-12-31 DIAGNOSIS — J449 Chronic obstructive pulmonary disease, unspecified: Secondary | ICD-10-CM | POA: Diagnosis not present

## 2019-12-31 DIAGNOSIS — I509 Heart failure, unspecified: Secondary | ICD-10-CM | POA: Diagnosis not present

## 2020-01-06 ENCOUNTER — Ambulatory Visit: Payer: Medicare Other | Admitting: Pulmonary Disease

## 2020-01-10 ENCOUNTER — Other Ambulatory Visit: Payer: Self-pay

## 2020-01-10 ENCOUNTER — Ambulatory Visit (INDEPENDENT_AMBULATORY_CARE_PROVIDER_SITE_OTHER): Payer: Medicare Other | Admitting: Sports Medicine

## 2020-01-10 ENCOUNTER — Encounter: Payer: Self-pay | Admitting: Pulmonary Disease

## 2020-01-10 ENCOUNTER — Ambulatory Visit (INDEPENDENT_AMBULATORY_CARE_PROVIDER_SITE_OTHER): Payer: Medicare Other | Admitting: Pulmonary Disease

## 2020-01-10 VITALS — BP 130/68 | HR 97 | Ht 64.0 in | Wt 117.0 lb

## 2020-01-10 VITALS — BP 150/123 | Ht 64.0 in | Wt 114.0 lb

## 2020-01-10 DIAGNOSIS — M2170 Unequal limb length (acquired), unspecified site: Secondary | ICD-10-CM

## 2020-01-10 DIAGNOSIS — J9611 Chronic respiratory failure with hypoxia: Secondary | ICD-10-CM | POA: Diagnosis not present

## 2020-01-10 DIAGNOSIS — J449 Chronic obstructive pulmonary disease, unspecified: Secondary | ICD-10-CM | POA: Diagnosis not present

## 2020-01-10 NOTE — Patient Instructions (Addendum)
I am glad you are doing well with regard to your breathing Continue the Anoro inhaler and supplemental oxygen  Follow-up in 6 months.

## 2020-01-10 NOTE — Progress Notes (Signed)
Kathy Pierce    [id number]    May 18, 1925  Primary Care Physician:Morrow, Marjory Lies, MD  Referring Physician: London Pepper, MD [address] [place],  [place] [address]  Chief complaint: Follow up for COPD  HPI: 86 year old with past medical history of lung cancer, COPD, essential thrombocythemia Followed by Dr. Julien Nordmann for suspicious left lower lobe nodule suspected to be bronchogenic carcinoma.  Bronchoscopy of the pelvis in January 2018 was nondiagnostic.  She underwent stereotactic radiation [completed March 2018] for this and has been under observation since then.  Pets: No pets Occupation: Used to work as a Network engineer for a night agency Exposures: No known exposures, no mold, hot tub, Jacuzzi Smoking history: 21-pack-year smoker.  Quit in 1978 Travel history: Lived in [place] for few years.  No other significant travel Relevant family history: No significant family history of lung disease  Interim history:  Hospitalized in July 2021 for hip fracture and CHF She was hospitalized in August from 08/30/19-09/05/19 for acute on chronic diastolic heart failure and COVID-19 infection. Treated with diuretics, remdesivir, Decadron.  Went to rehab after that  States that breathing is stable, continues on supplemental oxygen and anoro.  Outpatient Encounter Medications as of 01/10/2020  Medication Sig  . albuterol (PROVENTIL HFA;VENTOLIN HFA) 108 (90 Base) MCG/ACT inhaler Inhale 2 puffs into the lungs every 6 (six) hours as needed for wheezing or shortness of breath.  Marland Kitchen aspirin 81 MG tablet Take 81 mg by mouth daily.  Marland Kitchen atenolol (TENORMIN) 25 MG tablet Take 25 mg by mouth daily.   . calcium carbonate (OS-CAL - DOSED IN MG OF ELEMENTAL CALCIUM) 1250 MG tablet Take 1 tablet by mouth every evening.   . cyanocobalamin (,VITAMIN B-12,) 1000 MCG/ML injection Inject 1,000 mcg into the muscle every 30 (thirty) days.  Marland Kitchen docusate sodium (COLACE) 100 MG capsule Take 1 capsule  (100 mg total) by mouth 2 (two) times daily.  . ferrous sulfate 325 (65 FE) MG tablet Take 325 mg by mouth daily with breakfast.  . folic acid (FOLVITE) 1 MG tablet Take 1 tablet (1 mg total) by mouth daily.  . furosemide (LASIX) 20 MG tablet Take 1 tablet (20 mg total) by mouth daily.  . hydroxyurea (HYDREA) 500 MG capsule Take 500-1,000 mg by mouth every other day. May take with food to minimize GI side effects. Alternate Take 1 capsule (500 mg) and Take 2 capsules (1000 mg) Other Day  . Hypromellose (ARTIFICIAL TEARS OP) Place 1 drop into both eyes as needed (for dry eyes).  Marland Kitchen levothyroxine (SYNTHROID) 25 MCG tablet Take 25 mcg by mouth daily.  . meclizine (ANTIVERT) 25 MG tablet Take 25 mg by mouth 3 (three) times daily as needed for dizziness.  . Multiple Vitamins-Minerals (PRESERVISION AREDS PO) Take 1 tablet by mouth daily.  Marland Kitchen omeprazole (PRILOSEC) 20 MG capsule Take 20 mg by mouth every morning.   Marland Kitchen oxyCODONE (OXY IR/ROXICODONE) 5 MG immediate release tablet Take 1/2 to 1 pill every 6 hrs as needed for pain  . OXYGEN Inhale 3 L into the lungs.  . sertraline (ZOLOFT) 50 MG tablet Take 50 mg by mouth daily.  Marland Kitchen umeclidinium-vilanterol (ANORO ELLIPTA) 62.5-25 MCG/INH AEPB Inhale 1 puff into the lungs daily.  . [DISCONTINUED] enoxaparin (LOVENOX) 40 MG/0.4ML injection Inject 0.4 mLs (40 mg total) into the skin daily. For DVT prophylaxis after surgery  . [DISCONTINUED] ferrous sulfate 325 (65 FE) MG tablet Take 1 tablet (325 mg  total) by mouth daily with breakfast.   No facility-administered encounter medications on file as of 01/10/2020.   Physical Exam: Blood pressure 130/68, pulse 97, height 5\' 4"  (1.626 m), weight 117 lb (53.1 kg), SpO2 94 %. Gen:      No acute distress HEENT:  EOMI, sclera anicteric Neck:     No masses; no thyromegaly Lungs:    Clear to auscultation bilaterally; normal respiratory effort CV:         Regular rate and rhythm; no murmurs Abd:      + bowel sounds; soft,  non-tender; no palpable masses, no distension Ext:    No edema; adequate peripheral perfusion Skin:      Warm and dry; no rash Neuro: alert and oriented x 3 Psych: normal mood and affect  Data Reviewed: Imaging: PET scan 65/46/5035- hypermetabolic left lower lobe pulmonary nodule with AP window lymph node, precarinal and hilar lymphadenopathy. CT chest 11/05/2016- left lower lobe spiculated nodule with mild reticular changes in the periphery. CT chest 11/16/2017- 18 to 10 mm left lower lobe nodule mildly decreased with surrounding radiation fibrosis.  No evidence of metastatic disease. CT chest 11/19/2018- 8 mm left lower lobe nodule, radiation changes in superior segment left lower lobe  CTA 08/30/2019-no significant pulmonary embolism, bilateral pleural effusion with interstitialinfiltrates  PFTs: 05/24/2013-  FVC 1.72 [72%), FEV1 0.97 [45%), F/F 56, TLC 89%, DLCO 23% Moderate-severe obstruction with severe diffusion defect.  12/17/2017 FVC 2.03 [92%], FEV1 1.17 [72%), F/F 58, TLC 85%, DLCO 34% Moderate obstruction with severe diffusion defect.  Labs: CBC 11/16/2017-WBC 6.1, eos 3%, absolute eosinophil count 183 CBC 11/19/2018 - WBC 6.6 , eos 2%, absolute eosinophil count 132  Pathology Lung biopsy 02/11/2016-benign lung, with chronic inflammation, anthracosis. Lymph node biopsy of 7, 10 R-negative for malignancy.  Assessment:  Moderate COPD Continues on supplemental oxygen, anoro inhaler  Left lower lobe lung cancer Follow-up with surveillance imaging  HFpEF, Lower extremity edema from chronic venous insufficiency Continue Lasix per cardiology, compression stockings  Health maintenance 11/02/2017-influenza 09/29/2013-Prevnar 11/20/2001-Pneumovax Received Pfizer COVID-19 Vaccines   Plan/Recommendations: Continue anoro, supplemental oxygen next Follow-up in 6 months.  Marshell Garfinkel MD [facility] 01/10/2020, 3:04 PM

## 2020-01-11 ENCOUNTER — Ambulatory Visit (INDEPENDENT_AMBULATORY_CARE_PROVIDER_SITE_OTHER): Payer: Medicare Other | Admitting: Cardiology

## 2020-01-11 ENCOUNTER — Encounter: Payer: Self-pay | Admitting: Cardiology

## 2020-01-11 ENCOUNTER — Encounter: Payer: Self-pay | Admitting: Sports Medicine

## 2020-01-11 VITALS — BP 118/72 | HR 64 | Ht 62.0 in | Wt 109.4 lb

## 2020-01-11 DIAGNOSIS — J9611 Chronic respiratory failure with hypoxia: Secondary | ICD-10-CM

## 2020-01-11 DIAGNOSIS — R600 Localized edema: Secondary | ICD-10-CM

## 2020-01-11 DIAGNOSIS — I872 Venous insufficiency (chronic) (peripheral): Secondary | ICD-10-CM

## 2020-01-11 DIAGNOSIS — I5032 Chronic diastolic (congestive) heart failure: Secondary | ICD-10-CM

## 2020-01-11 MED ORDER — ACETAMINOPHEN 325 MG PO TABS: 650.0000 mg | ORAL_TABLET | Freq: Four times a day (QID) | ORAL | Status: AC | PRN

## 2020-01-11 NOTE — Patient Instructions (Addendum)
Medication Instructions:  Ok to take tylenol 650 mg every 6 hours as needed for pain.  *If you need a refill on your cardiac medications before your next appointment, please call your pharmacy*   Lab Work: None   Testing/Procedures: None   Follow-Up: At [facility], you and your health needs are our priority.  As part of our continuing mission to provide you with exceptional heart care, we have created designated Provider Care Teams.  These Care Teams include your primary Cardiologist (physician) and Advanced Practice Providers (APPs -  Physician Assistants and Nurse Practitioners) who all work together to provide you with the care you need, when you need it.  We recommend signing up for the patient portal called "MyChart".  Sign up information is provided on this After Visit Summary.  MyChart is used to connect with patients for Virtual Visits (Telemedicine).  Patients are able to view lab/test results, encounter notes, upcoming appointments, etc.  Non-urgent messages can be sent to your provider as well.   To learn more about what you can do with MyChart, go to [email or web address].    Your next appointment:   6 month(s)  The format for your next appointment:   In Person  Provider:   Buford Dresser, MD  -Continue using compression stocking and elevating legs -Continue daily weights

## 2020-01-11 NOTE — Progress Notes (Signed)
   Subjective:    Patient ID: Kathy Pierce, female    DOB: 1925-05-24, 86 y.o.   MRN: [id number]  HPI chief complaint: "I am here for orthotics"  Very pleasant 86 year old female comes in today with her daughter at the request of Dr. Griffin Basil to discuss possible custom orthotics.  She is status post ORIF for an intertrochanteric left femur fracture done in July of this year.  According to her daughter, the patient fell again 2 weeks later resulting in some displacement of the intramedullary rod.  This is all resulted in a leg length discrepancy with her left leg being shorter than the right.  Dr. Griffin Basil felt she might benefit from custom orthotics so he referred her to [place].  The patient presents today in a wheelchair and she did not bring her walker.  It sounds like her ambulation is minimal at best but the patient is concerned that her altered gait due to her leg length discrepancy may result in another fall.  She denies any pain in her foot or ankle.  She is wearing her most comfortable shoes today.  Past medical history reviewed Surgical history reviewed Allergies reviewed Medications reviewed    Review of Systems    As above Objective:   Physical Exam  Patient sits comfortably in her wheelchair.  She is in no acute distress.  Patient was unable to lie supine on the exam table so leg length measurements were approximated by measuring the distance from the ASIS to the medial malleolus with her seated in her wheelchair.  That measurement is 33.5 cm on the right and 29 cm on the left.  Her left foot and ankle is bandaged.      Assessment & Plan:   Leg length discrepancy Status post left hip ORIF  Patient's  leg length discrepancy is significant.  She measures approximately 1.7 inches shorter on the left than on the right.  Again, this is an approximated measurement since she was unable to lie supine on the exam table.  It sounds like her ambulation is minimal so I think it best to start  with a slight correction and then see her back in 4 weeks.  Patient's daughter is in agreement with this plan.  I have given the patient both a 5/16 inch lift as well as a 7/16 inch lift on a green insert which she can move from one pair of shoes to another.  We can continue to build this up at follow-up but we may also consider referral to biotech or [employer] for a custom made shoe instead.

## 2020-01-11 NOTE — Progress Notes (Signed)
Cardiology Office Note:    Date:  01/11/2020   ID:  Kathy Pierce, DOB [birth year], MRN [id number]  PCP:  London Pepper, MD  Cardiologist:  Buford Dresser, MD  Referring MD: London Pepper, MD   CC: follow up  History of Present Illness:    Kathy Pierce is a 87 y.o. female with a hx of hypertension, COPD, chronic hypoxic respiratory failure on home O2, history of lung cancer, who is seen for follow up today. I initially met her 11/15/19 as a new consult at the request of London Pepper, MD for the evaluation and management of heart failure.  Today: Weight down about 8 lbs form yesterday, which is likely inaccurate. Seen by Dr. Vaughan Browner yesterday. Breathing and LE edema continue to be an issue but has health care assistance in her facility to monitor this. We discussed leg compression, elevation. She needs an order for tylenol for her facility to be able to give it as needed.    Past Medical History:  Diagnosis Date   Cancer ([place])    Squamous cell carcinoma, skin, basal cell carcinoma   Complication of anesthesia    wakes up slowly    COPD (chronic obstructive pulmonary disease) (HCC)    Depression    Dyspnea    Essential thrombocythemia ([place])    Hypertension    Lung cancer ([place]) dx'd 2017   Osteoporosis    Sciatica of left side 06/16/2012   Spinal stenosis of lumbar region 06/16/2012   Vitamin B12 deficiency     Past Surgical History:  Procedure Laterality Date   APPENDECTOMY     CATARACT EXTRACTION, BILATERAL Bilateral    INTRAMEDULLARY (IM) NAIL INTERTROCHANTERIC Left 08/11/2019   Procedure: INTRAMEDULLARY (IM) NAIL INTERTROCHANTRIC;  Surgeon: Hiram Gash, MD;  Location: [facility];  Service: Orthopedics;  Laterality: Left;   STAPEDECTOMY Right    TONSILLECTOMY     VIDEO BRONCHOSCOPY WITH ENDOBRONCHIAL NAVIGATION N/A 02/11/2016   Procedure: VIDEO BRONCHOSCOPY WITH ENDOBRONCHIAL NAVIGATION;  Surgeon: Grace Isaac, MD;  Location: [facility];  Service: Thoracic;   Laterality: N/A;   VIDEO BRONCHOSCOPY WITH ENDOBRONCHIAL ULTRASOUND N/A 02/11/2016   Procedure: VIDEO BRONCHOSCOPY WITH ENDOBRONCHIAL ULTRASOUND with biopsy of nodes #7 and #10;  Surgeon: Grace Isaac, MD;  Location: [facility];  Service: Thoracic;  Laterality: N/A;    Current Medications: Current Outpatient Medications on File Prior to Visit  Medication Sig   albuterol (PROVENTIL HFA;VENTOLIN HFA) 108 (90 Base) MCG/ACT inhaler Inhale 2 puffs into the lungs every 6 (six) hours as needed for wheezing or shortness of breath.   aspirin 81 MG tablet Take 81 mg by mouth daily.   atenolol (TENORMIN) 25 MG tablet Take 25 mg by mouth daily.    calcium carbonate (OS-CAL - DOSED IN MG OF ELEMENTAL CALCIUM) 1250 MG tablet Take 1 tablet by mouth every evening.    cyanocobalamin (,VITAMIN B-12,) 1000 MCG/ML injection Inject 1,000 mcg into the muscle every 30 (thirty) days.   docusate sodium (COLACE) 100 MG capsule Take 1 capsule (100 mg total) by mouth 2 (two) times daily.   ferrous sulfate 325 (65 FE) MG tablet Take 325 mg by mouth daily with breakfast.   folic acid (FOLVITE) 1 MG tablet Take 1 tablet (1 mg total) by mouth daily.   furosemide (LASIX) 20 MG tablet Take 1 tablet (20 mg total) by mouth daily.   hydroxyurea (HYDREA) 500 MG capsule Take 500-1,000 mg by mouth every other day. May take with food to  minimize GI side effects. Alternate Take 1 capsule (500 mg) and Take 2 capsules (1000 mg) Other Day   Hypromellose (ARTIFICIAL TEARS OP) Place 1 drop into both eyes as needed (for dry eyes).   levothyroxine (SYNTHROID) 25 MCG tablet Take 25 mcg by mouth daily.   meclizine (ANTIVERT) 25 MG tablet Take 25 mg by mouth 3 (three) times daily as needed for dizziness.   Multiple Vitamins-Minerals (PRESERVISION AREDS PO) Take 1 tablet by mouth daily.   omeprazole (PRILOSEC) 20 MG capsule Take 20 mg by mouth every morning.    oxyCODONE (OXY IR/ROXICODONE) 5 MG immediate release tablet Take 1/2 to 1 pill every 6  hrs as needed for pain   OXYGEN Inhale 3 L into the lungs.   sertraline (ZOLOFT) 50 MG tablet Take 50 mg by mouth daily.   umeclidinium-vilanterol (ANORO ELLIPTA) 62.5-25 MCG/INH AEPB Inhale 1 puff into the lungs daily.   No current facility-administered medications on file prior to visit.     Allergies:   Penicillins, Codeine, Erythromycin, and Sulfa drugs cross reactors   Social History   Tobacco Use   Smoking status: Former Smoker    Packs/day: 1.00    Years: 21.00    Pack years: 21.00    Types: Cigarettes    Quit date: 07/10/1976    Years since quitting: 43.5   Smokeless tobacco: Never Used  Vaping Use   Vaping Use: Never used  Substance Use Topics   Alcohol use: No   Drug use: No    Family History: family history includes Cancer in her brother, brother, daughter, mother, and sister; Heart attack in her father.  ROS:   Please see the history of present illness.  Additional pertinent ROS: Constitutional: Negative for chills, fever, night sweats, unintentional weight loss  HENT: Negative for ear pain and hearing loss.   Eyes: Negative for loss of vision and eye pain.  Respiratory: Negative for cough, sputum, wheezing.   Cardiovascular: See HPI. Gastrointestinal: Negative for abdominal pain, melena, and hematochezia.  Genitourinary: Negative for dysuria and hematuria.  Musculoskeletal: Negative for falls and myalgias.  Skin: Negative for itching and rash.  Neurological: Negative for focal weakness, focal sensory changes and loss of consciousness.  Endo/Heme/Allergies: Does not bruise/bleed easily.     EKGs/Labs/Other Studies Reviewed:    The following studies were reviewed today: Echo 07/27/19 1. Left ventricular ejection fraction, by estimation, is 60 to 65%. The  left ventricle has normal function. The left ventricle has no regional  wall motion abnormalities. There is mild concentric left ventricular  hypertrophy. Left ventricular diastolic  parameters are  consistent with Grade II diastolic dysfunction  (pseudonormalization). Elevated left ventricular end-diastolic pressure.   2. Right ventricular systolic function is normal. The right ventricular  size is normal. There is moderately elevated pulmonary artery systolic  pressure.   3. Left atrial size was severely dilated.   4. Right atrial size was mildly dilated.   5. The mitral valve is normal in structure. Trivial mitral valve  regurgitation. No evidence of mitral stenosis.   6. The aortic valve is tricuspid. Aortic valve regurgitation is trivial.  Mild aortic valve sclerosis is present, with no evidence of aortic valve  stenosis.   7. Aortic dilatation noted. There is mild dilatation of the ascending  aorta measuring 38 mm.   8. The inferior vena cava is dilated in size with >50% respiratory  variability, suggesting right atrial pressure of 8 mmHg.   EKG:  EKG is personally reviewed.  The ekg ordered today demonstrates sinus rhythm with PACs at 84 bpm  Recent Labs: 07/27/2019: TSH 1.929 08/12/2019: Magnesium 1.9 08/30/2019: B Natriuretic Peptide 236.5 09/04/2019: ALT 15; Hemoglobin 11.2; Platelets 858 11/03/2019: BUN 12; Creatinine, Ser 0.60; Potassium 4.1; Pro B Natriuretic peptide (BNP) 227.0; Sodium 137  Recent Lipid Panel    Component Value Date/Time   CHOL 122 07/27/2019 0118   TRIG 35 07/27/2019 0118   HDL 43 07/27/2019 0118   CHOLHDL 2.8 07/27/2019 0118   VLDL 7 07/27/2019 0118   LDLCALC 72 07/27/2019 0118    Physical Exam:    VS:  BP 118/72    Pulse 64    Ht _0  (1.575 m)    Wt 109 lb 6.4 oz (49.6 kg)    BMI 20.01 kg/m     Wt Readings from Last 3 Encounters:  01/11/20 109 lb 6.4 oz (49.6 kg)  01/10/20 117 lb (53.1 kg)  01/10/20 114 lb (51.7 kg)    GEN: Well nourished, well developed in no acute distress HEENT: Normal, moist mucous membranes NECK: No JVD CARDIAC: regular rhythm, normal S1 and S2, no rubs or gallops. No murmur. VASCULAR: Radial pulses 2+  bilaterally. No carotid bruits. LE pulses faint but palpable RESPIRATORY:  Clear to auscultation without rales, wheezing or rhonchi  ABDOMEN: Soft, non-tender, non-distended MUSCULOSKELETAL:  Ambulates independently SKIN: Warm and dry, bilateral trivial LE edema NEUROLOGIC:  Alert and oriented x 3. No focal neuro deficits noted. PSYCHIATRIC:  Normal affect     ASSESSMENT:    1. Chronic diastolic congestive heart failure ([place])   2. Bilateral leg edema   3. Venous insufficiency (chronic) (peripheral)   4. Chronic respiratory failure with hypoxia (HCC)    PLAN:    Chronic diastolic heart failure Shortness of breath Lower extremity edema Heart failure education -she has no JVD and clear lungs today, with only trace pitting edema. Appears euvolemic. Would continue lasix 20 mg daily.  -we did extensive heart failure education. She is in a facility but they will try to make sure that recommendations are being followed   Chronic venous insufficiency -discussed compression, elevation, salt avoidance at length  COPD, chronic hypoxic respiratory failure on home O2, recent covid infection -lungs clear today -management per pulmonary team  Plan for follow up: 6 mos or sooner as needed  Buford Dresser, MD, PhD [place]   [facility]  Medication Adjustments/Labs and Tests Ordered: Current medicines are reviewed at length with the patient today.  Concerns regarding medicines are outlined above.  No orders of the defined types were placed in this encounter.  Meds ordered this encounter  Medications   acetaminophen (TYLENOL) 325 MG tablet    Sig: Take 2 tablets (650 mg total) by mouth every 6 (six) hours as needed.    Patient Instructions  Medication Instructions:  Ok to take tylenol 650 mg every 6 hours as needed for pain.  *If you need a refill on your cardiac medications before your next appointment, please call your pharmacy*   Lab  Work: None   Testing/Procedures: None   Follow-Up: At [facility], you and your health needs are our priority.  As part of our continuing mission to provide you with exceptional heart care, we have created designated Provider Care Teams.  These Care Teams include your primary Cardiologist (physician) and Advanced Practice Providers (APPs -  Physician Assistants and Nurse Practitioners) who all work together to provide you with the care you need, when you need it.  We recommend signing up for the patient portal called "MyChart".  Sign up information is provided on this After Visit Summary.  MyChart is used to connect with patients for Virtual Visits (Telemedicine).  Patients are able to view lab/test results, encounter notes, upcoming appointments, etc.  Non-urgent messages can be sent to your provider as well.   To learn more about what you can do with MyChart, go to [email or web address].    Your next appointment:   6 month(s)  The format for your next appointment:   In Person  Provider:   Buford Dresser, MD  -Continue using compression stocking and elevating legs -Continue daily weights     Signed, Buford Dresser, MD PhD 01/11/2020  [place]

## 2020-01-19 DIAGNOSIS — S7292XD Unspecified fracture of left femur, subsequent encounter for closed fracture with routine healing: Secondary | ICD-10-CM | POA: Diagnosis not present

## 2020-01-19 DIAGNOSIS — M6281 Muscle weakness (generalized): Secondary | ICD-10-CM | POA: Diagnosis not present

## 2020-01-19 DIAGNOSIS — Z4789 Encounter for other orthopedic aftercare: Secondary | ICD-10-CM | POA: Diagnosis not present

## 2020-01-19 DIAGNOSIS — L97921 Non-pressure chronic ulcer of unspecified part of left lower leg limited to breakdown of skin: Secondary | ICD-10-CM | POA: Diagnosis not present

## 2020-01-20 DIAGNOSIS — I110 Hypertensive heart disease with heart failure: Secondary | ICD-10-CM | POA: Diagnosis not present

## 2020-01-20 DIAGNOSIS — S81802D Unspecified open wound, left lower leg, subsequent encounter: Secondary | ICD-10-CM | POA: Diagnosis not present

## 2020-01-20 DIAGNOSIS — J449 Chronic obstructive pulmonary disease, unspecified: Secondary | ICD-10-CM | POA: Diagnosis not present

## 2020-01-20 DIAGNOSIS — I5030 Unspecified diastolic (congestive) heart failure: Secondary | ICD-10-CM | POA: Diagnosis not present

## 2020-01-31 DIAGNOSIS — I509 Heart failure, unspecified: Secondary | ICD-10-CM | POA: Diagnosis not present

## 2020-01-31 DIAGNOSIS — J449 Chronic obstructive pulmonary disease, unspecified: Secondary | ICD-10-CM | POA: Diagnosis not present

## 2020-02-19 DIAGNOSIS — M6281 Muscle weakness (generalized): Secondary | ICD-10-CM | POA: Diagnosis not present

## 2020-02-19 DIAGNOSIS — Z4789 Encounter for other orthopedic aftercare: Secondary | ICD-10-CM | POA: Diagnosis not present

## 2020-02-19 DIAGNOSIS — L97921 Non-pressure chronic ulcer of unspecified part of left lower leg limited to breakdown of skin: Secondary | ICD-10-CM | POA: Diagnosis not present

## 2020-02-19 DIAGNOSIS — S7292XD Unspecified fracture of left femur, subsequent encounter for closed fracture with routine healing: Secondary | ICD-10-CM | POA: Diagnosis not present

## 2020-02-21 DIAGNOSIS — H40013 Open angle with borderline findings, low risk, bilateral: Secondary | ICD-10-CM | POA: Diagnosis not present

## 2020-02-21 DIAGNOSIS — H35371 Puckering of macula, right eye: Secondary | ICD-10-CM | POA: Diagnosis not present

## 2020-02-21 DIAGNOSIS — H02831 Dermatochalasis of right upper eyelid: Secondary | ICD-10-CM | POA: Diagnosis not present

## 2020-02-21 DIAGNOSIS — H353132 Nonexudative age-related macular degeneration, bilateral, intermediate dry stage: Secondary | ICD-10-CM | POA: Diagnosis not present

## 2020-02-22 DIAGNOSIS — C44629 Squamous cell carcinoma of skin of left upper limb, including shoulder: Secondary | ICD-10-CM | POA: Diagnosis not present

## 2020-02-22 DIAGNOSIS — L570 Actinic keratosis: Secondary | ICD-10-CM | POA: Diagnosis not present

## 2020-02-22 DIAGNOSIS — D485 Neoplasm of uncertain behavior of skin: Secondary | ICD-10-CM | POA: Diagnosis not present

## 2020-02-22 DIAGNOSIS — C44622 Squamous cell carcinoma of skin of right upper limb, including shoulder: Secondary | ICD-10-CM | POA: Diagnosis not present

## 2020-02-28 DIAGNOSIS — Z0000 Encounter for general adult medical examination without abnormal findings: Secondary | ICD-10-CM | POA: Diagnosis not present

## 2020-02-28 DIAGNOSIS — I10 Essential (primary) hypertension: Secondary | ICD-10-CM | POA: Diagnosis not present

## 2020-02-28 DIAGNOSIS — J449 Chronic obstructive pulmonary disease, unspecified: Secondary | ICD-10-CM | POA: Diagnosis not present

## 2020-02-28 DIAGNOSIS — M810 Age-related osteoporosis without current pathological fracture: Secondary | ICD-10-CM | POA: Diagnosis not present

## 2020-03-02 DIAGNOSIS — J449 Chronic obstructive pulmonary disease, unspecified: Secondary | ICD-10-CM | POA: Diagnosis not present

## 2020-03-02 DIAGNOSIS — I509 Heart failure, unspecified: Secondary | ICD-10-CM | POA: Diagnosis not present

## 2020-03-05 ENCOUNTER — Telehealth: Payer: Self-pay

## 2020-03-05 ENCOUNTER — Encounter (HOSPITAL_COMMUNITY): Payer: Self-pay | Admitting: Emergency Medicine

## 2020-03-05 ENCOUNTER — Other Ambulatory Visit: Payer: Self-pay

## 2020-03-05 ENCOUNTER — Emergency Department (HOSPITAL_COMMUNITY): Payer: Medicare Other

## 2020-03-05 ENCOUNTER — Emergency Department (HOSPITAL_COMMUNITY)
Admission: EM | Admit: 2020-03-05 | Discharge: 2020-03-06 | Disposition: A | Discharge status: Home / Self Care | Payer: Medicare Other | Attending: Emergency Medicine | Admitting: Emergency Medicine

## 2020-03-05 DIAGNOSIS — Z87891 Personal history of nicotine dependence: Secondary | ICD-10-CM | POA: Insufficient documentation

## 2020-03-05 DIAGNOSIS — Z85118 Personal history of other malignant neoplasm of bronchus and lung: Secondary | ICD-10-CM | POA: Insufficient documentation

## 2020-03-05 DIAGNOSIS — I509 Heart failure, unspecified: Secondary | ICD-10-CM | POA: Insufficient documentation

## 2020-03-05 DIAGNOSIS — Z85828 Personal history of other malignant neoplasm of skin: Secondary | ICD-10-CM | POA: Insufficient documentation

## 2020-03-05 DIAGNOSIS — R600 Localized edema: Secondary | ICD-10-CM

## 2020-03-05 DIAGNOSIS — Z7982 Long term (current) use of aspirin: Secondary | ICD-10-CM | POA: Diagnosis not present

## 2020-03-05 DIAGNOSIS — I110 Hypertensive heart disease with heart failure: Secondary | ICD-10-CM | POA: Insufficient documentation

## 2020-03-05 DIAGNOSIS — Z8616 Personal history of COVID-19: Secondary | ICD-10-CM | POA: Diagnosis not present

## 2020-03-05 DIAGNOSIS — J449 Chronic obstructive pulmonary disease, unspecified: Secondary | ICD-10-CM | POA: Insufficient documentation

## 2020-03-05 DIAGNOSIS — R5381 Other malaise: Secondary | ICD-10-CM | POA: Diagnosis not present

## 2020-03-05 DIAGNOSIS — J90 Pleural effusion, not elsewhere classified: Secondary | ICD-10-CM | POA: Diagnosis not present

## 2020-03-05 DIAGNOSIS — Z79899 Other long term (current) drug therapy: Secondary | ICD-10-CM | POA: Insufficient documentation

## 2020-03-05 DIAGNOSIS — R2243 Localized swelling, mass and lump, lower limb, bilateral: Secondary | ICD-10-CM | POA: Diagnosis present

## 2020-03-05 DIAGNOSIS — I10 Essential (primary) hypertension: Secondary | ICD-10-CM | POA: Diagnosis not present

## 2020-03-05 DIAGNOSIS — I4891 Unspecified atrial fibrillation: Secondary | ICD-10-CM | POA: Diagnosis not present

## 2020-03-05 DIAGNOSIS — J439 Emphysema, unspecified: Secondary | ICD-10-CM | POA: Diagnosis not present

## 2020-03-05 LAB — COMPREHENSIVE METABOLIC PANEL
ALT: 16 U/L (ref 0–44)
AST: 27 U/L (ref 15–41)
Albumin: 3.7 g/dL (ref 3.5–5.0)
Alkaline Phosphatase: 123 U/L (ref 38–126)
Anion gap: 11 (ref 5–15)
BUN: 12 mg/dL (ref 8–23)
CO2: 28 mmol/L (ref 22–32)
Calcium: 9.1 mg/dL (ref 8.9–10.3)
Chloride: 93 mmol/L — ABNORMAL LOW (ref 98–111)
Creatinine, Ser: 0.53 mg/dL (ref 0.44–1.00)
GFR, Estimated: >60 mL/min (ref >60)
Glucose, Bld: 114 mg/dL — ABNORMAL HIGH (ref 70–99)
Potassium: 3.4 mmol/L — ABNORMAL LOW (ref 3.5–5.1)
Sodium: 132 mmol/L — ABNORMAL LOW (ref 135–145)
Total Bilirubin: 1 mg/dL (ref 0.3–1.2)
Total Protein: 7 g/dL (ref 6.5–8.1)

## 2020-03-05 LAB — CBC WITH DIFFERENTIAL/PLATELET
Abs Immature Granulocytes: 0.07 10*3/uL (ref 0.00–0.07)
Basophils Absolute: 0 10*3/uL (ref 0.0–0.1)
Basophils Relative: 0 %
Eosinophils Absolute: 0.2 10*3/uL (ref 0.0–0.5)
Eosinophils Relative: 2 %
HCT: 43.7 % (ref 36.0–46.0)
Hemoglobin: 14.7 g/dL (ref 12.0–15.0)
Immature Granulocytes: 1 %
Lymphocytes Relative: 5 %
Lymphs Abs: 0.5 10*3/uL — ABNORMAL LOW (ref 0.7–4.0)
MCH: 35.1 pg — ABNORMAL HIGH (ref 26.0–34.0)
MCHC: 33.6 g/dL (ref 30.0–36.0)
MCV: 104.3 fL — ABNORMAL HIGH (ref 80.0–100.0)
Monocytes Absolute: 1.3 10*3/uL — ABNORMAL HIGH (ref 0.1–1.0)
Monocytes Relative: 11 %
Neutro Abs: 9.1 10*3/uL — ABNORMAL HIGH (ref 1.7–7.7)
Neutrophils Relative %: 81 %
Platelets: 678 10*3/uL — ABNORMAL HIGH (ref 150–400)
RBC: 4.19 MIL/uL (ref 3.87–5.11)
RDW: 17.9 % — ABNORMAL HIGH (ref 11.5–15.5)
WBC: 11.2 10*3/uL — ABNORMAL HIGH (ref 4.0–10.5)
nRBC: 0 % (ref 0.0–0.2)

## 2020-03-05 LAB — BRAIN NATRIURETIC PEPTIDE: B Natriuretic Peptide: 470.5 pg/mL — ABNORMAL HIGH (ref 0.0–100.0)

## 2020-03-05 MED ORDER — FUROSEMIDE 10 MG/ML IJ SOLN
20.0000 mg | Freq: Once | INTRAMUSCULAR | Status: AC
  Administered 2020-03-05: 20 mg via INTRAVENOUS
  Filled 2020-03-05: qty 4

## 2020-03-05 MED ORDER — POTASSIUM CHLORIDE CRYS ER 20 MEQ PO TBCR
40.0000 meq | EXTENDED_RELEASE_TABLET | Freq: Once | ORAL | Status: AC
  Administered 2020-03-05: 40 meq via ORAL
  Filled 2020-03-05: qty 2

## 2020-03-05 MED ORDER — POTASSIUM CHLORIDE CRYS ER 20 MEQ PO TBCR: 20.0000 meq | EXTENDED_RELEASE_TABLET | Freq: Every day | ORAL | 0 refills | Status: AC

## 2020-03-05 NOTE — Discharge Instructions (Addendum)
Increase your Lasix from 20 mg/day to 20 mg twice per day for the next 3 days.  You are also being prescribed potassium for those 3 days.  Follow-up with your primary care physician and/or cardiologist.

## 2020-03-05 NOTE — ED Triage Notes (Signed)
Patient BIBA from [facility] c/o weeping legs bilaterally. Hx of CHF. Staff called because her dressing was changed and immediately became soaked again. EMS reports pedal pulses are intact. Patient has no complaints.    100% 3 L [place] at baseline BP 142/86 P 78 RR 18 CBG 202

## 2020-03-05 NOTE — Telephone Encounter (Signed)
Spoke with patient's daughter [name]. Patient is now at [facility]. Patient's daughter would like a call prior to NP seeing patient. Daughter Joy CB# [phone]. Emailed Horticulturist, commercial.

## 2020-03-05 NOTE — ED Provider Notes (Signed)
[facility] DEPT Provider Note   CSN: [id number] Arrival date & time: 03/05/20  1851     History Chief Complaint  Patient presents with  . Leg Swelling    Kathy Pierce is a 87 y.o. female.  HPI 87 year old female sent in from her facility for leg weeping.  She has a history of CHF.  She is chronically on 3 L of oxygen.  She does not feel worse shortness of breath than typical.  She states that she has had bilateral leg weeping since this morning.  Has been taking her diuretics.  No chest pain or other discomfort.  Leg swelling seems to be baseline according to her.   Past Medical History:  Diagnosis Date  . Cancer (HCC)    Squamous cell carcinoma, skin, basal cell carcinoma  . Complication of anesthesia    wakes up slowly   . COPD (chronic obstructive pulmonary disease) ([place])   . Depression   . Dyspnea   . Essential thrombocythemia ([place])   . Hypertension   . Lung cancer ([place]) dx'd 2017  . Osteoporosis   . Sciatica of left side 06/16/2012  . Spinal stenosis of lumbar region 06/16/2012  . Vitamin B12 deficiency     Patient Active Problem List   Diagnosis Date Noted  . Personal history of COVID-19 11/15/2019  . Venous insufficiency (chronic) (peripheral) 11/15/2019  . Chronic respiratory failure with hypoxia ([place]) 08/30/2019  . COVID-19 virus infection 08/30/2019  . Hip fracture ([place]) 08/11/2019  . Congestive heart failure (CHF) ([place]) 07/26/2019  . Chronic obstructive pulmonary disease ([place]) 12/18/2017  . Essential hypertension 12/18/2017  . Major depression 12/18/2017  . Osteoporosis 12/18/2017  . Vitamin B deficiency 12/18/2017  . Essential thrombocythemia ([place]) 11/18/2016  . 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 Conductive hearing loss in right ear 04/25/2015  . Cerumen impaction 04/25/2015  . Sciatica of left side 06/16/2012  . Spinal stenosis of lumbar region 06/16/2012    Past Surgical History:   Procedure Laterality Date  . APPENDECTOMY    . CATARACT EXTRACTION, BILATERAL Bilateral   . INTRAMEDULLARY (IM) NAIL INTERTROCHANTERIC Left 08/11/2019   Procedure: INTRAMEDULLARY (IM) NAIL INTERTROCHANTRIC;  Surgeon: Hiram Gash, MD;  Location: [facility];  Service: Orthopedics;  Laterality: Left;  . STAPEDECTOMY Right   . TONSILLECTOMY    . VIDEO BRONCHOSCOPY WITH ENDOBRONCHIAL NAVIGATION N/A 02/11/2016   Procedure: VIDEO BRONCHOSCOPY WITH ENDOBRONCHIAL NAVIGATION;  Surgeon: Grace Isaac, MD;  Location: [facility];  Service: Thoracic;  Laterality: N/A;  . VIDEO BRONCHOSCOPY WITH ENDOBRONCHIAL ULTRASOUND N/A 02/11/2016   Procedure: VIDEO BRONCHOSCOPY WITH ENDOBRONCHIAL ULTRASOUND with biopsy of nodes #7 and #10;  Surgeon: Grace Isaac, MD;  Location: [facility];  Service: Thoracic;  Laterality: N/A;     OB History   No obstetric history on file.     Family History  Problem Relation Age of Onset  . Heart attack Father   . Cancer Mother   . Cancer Sister   . Cancer Brother   . Cancer Brother   . Cancer Daughter     Social History   Tobacco Use  . Smoking status: Former Smoker    Packs/day: 1.00    Years: 21.00    Pack years: 21.00    Types: Cigarettes    Quit date: 07/10/1976    Years since quitting: 43.6  . Smokeless tobacco: Never Used  Vaping Use  . Vaping Use: Never used  Substance Use Topics  . Alcohol use: No  . Drug use: No    Home Medications Prior to Admission medications   Medication Sig Start Date End Date Taking? Authorizing Provider  acetaminophen (TYLENOL) 325 MG tablet Take 2 tablets (650 mg total) by mouth every 6 (six) hours as needed. Patient taking differently: Take 650 mg by mouth every 6 (six) hours as needed for moderate pain. 01/11/20  Yes Buford Dresser, MD  albuterol (PROVENTIL HFA;VENTOLIN HFA) 108 (90 Base) MCG/ACT inhaler Inhale 2 puffs into the lungs every 6 (six) hours as needed for wheezing or shortness of breath.   Yes [provider]  aspirin 81 MG tablet Take 81 mg by mouth daily.   Yes [provider]  atenolol (TENORMIN) 25 MG tablet Take 25 mg by mouth daily.  07/26/19  Yes [provider]  bimatoprost (LUMIGAN) 0.01 % SOLN Place 1 drop into both eyes at bedtime.   Yes [provider]  calcium carbonate (TUMS - DOSED IN MG ELEMENTAL CALCIUM) 500 MG chewable tablet Chew 1 tablet by mouth daily.   Yes [provider]  cyanocobalamin (,VITAMIN B-12,) 1000 MCG/ML injection Inject 1,000 mcg into the muscle every 30 (thirty) days.   Yes [provider]  docusate sodium (COLACE) 100 MG capsule Take 1 capsule (100 mg total) by mouth 2 (two) times daily. 08/15/19  Yes Lavina Hamman, MD  ferrous sulfate 325 (65 FE) MG tablet Take 325 mg by mouth daily.   Yes [provider]  folic acid (FOLVITE) 1 MG tablet Take 1 tablet (1 mg total) by mouth daily. 08/16/19  Yes Lavina Hamman, MD  furosemide (LASIX) 20 MG tablet Take 1 tablet (20 mg total) by mouth daily. 09/02/19  Yes Patrecia Pour, MD  hydroxyurea (HYDREA) 500 MG capsule Take 500-1,000 mg by mouth every other day. Take 1 capsule (500 mg) and Take 2 capsules (1000 mg) on alternate days   Yes [provider]  levothyroxine (SYNTHROID) 25 MCG tablet Take 25 mcg by mouth daily. 08/24/18  Yes [provider]  meclizine (ANTIVERT) 25 MG tablet Take 25 mg by mouth 3 (three) times daily.   Yes [provider]  Multiple Vitamins-Minerals (PRESERVISION AREDS PO) Take 1 tablet by mouth daily.   Yes [provider]  omeprazole (PRILOSEC) 20 MG capsule Take 20 mg by mouth daily. 07/11/19  Yes [provider]  OXYGEN Inhale 3 L into the lungs continuous.   Yes [provider]  polyvinyl alcohol (LIQUIFILM TEARS) 1.4 % ophthalmic solution Place 1 drop into both eyes 3 (three) times daily.   Yes [provider]  potassium chloride SA (KLOR-CON) 20 MEQ tablet Take 1  tablet (20 mEq total) by mouth daily. 03/05/20  Yes Sherwood Gambler, MD  sertraline (ZOLOFT) 50 MG tablet Take 50 mg by mouth daily. 10/17/19  Yes [provider]  umeclidinium-vilanterol (ANORO ELLIPTA) 62.5-25 MCG/INH AEPB Inhale 1 puff into the lungs daily. 06/28/19  Yes Mannam, Praveen, MD  oxyCODONE (OXY IR/ROXICODONE) 5 MG immediate release tablet Take 1/2 to 1 pill every 6 hrs as needed for pain Patient not taking: Reported on 03/05/2020 09/05/19   Little Ishikawa, MD    Allergies    Penicillins, Codeine, Erythromycin, and Sulfa drugs cross reactors  Review of Systems   Review of Systems  Respiratory: Negative for shortness of breath.   Cardiovascular: Positive for leg swelling. Negative for chest pain.  All other systems reviewed and  are negative.   Physical Exam Updated Vital Signs BP (!) 180/112   Pulse 70   Temp 97.6 F (36.4 C) (Oral)   Resp (!) 22   SpO2 96%   Physical Exam Vitals and nursing note reviewed.  Constitutional:      Appearance: She is well-developed and well-nourished.  HENT:     Head: Normocephalic and atraumatic.     Right Ear: External ear normal.     Left Ear: External ear normal.     Nose: Nose normal.  Eyes:     General:        Right eye: No discharge.        Left eye: No discharge.  Cardiovascular:     Rate and Rhythm: Normal rate and regular rhythm.     Heart sounds: Normal heart sounds.  Pulmonary:     Effort: Pulmonary effort is normal.     Breath sounds: Rales (basilar bilaterally) present.  Abdominal:     Palpations: Abdomen is soft.     Tenderness: There is no abdominal tenderness.  Musculoskeletal:     Comments: Bilateral legs are wrapped. In the proximal thighs there is some skin breakdown, no bloody output  Skin:    General: Skin is warm and dry.  Neurological:     Mental Status: She is alert.  Psychiatric:        Mood and Affect: Mood is not anxious.     ED Results / Procedures / Treatments   Labs (all labs  ordered are listed, but only abnormal results are displayed) Labs Reviewed  COMPREHENSIVE METABOLIC PANEL - Abnormal; Notable for the following components:      Result Value   Sodium 132 (*)    Potassium 3.4 (*)    Chloride 93 (*)    Glucose, Bld 114 (*)    All other components within normal limits  BRAIN NATRIURETIC PEPTIDE - Abnormal; Notable for the following components:   B Natriuretic Peptide 470.5 (*)    All other components within normal limits  CBC WITH DIFFERENTIAL/PLATELET - Abnormal; Notable for the following components:   WBC 11.2 (*)    MCV 104.3 (*)    MCH 35.1 (*)    RDW 17.9 (*)    Platelets 678 (*)    Neutro Abs 9.1 (*)    Lymphs Abs 0.5 (*)    Monocytes Absolute 1.3 (*)    All other components within normal limits    EKG EKG Interpretation  Date/Time:  Monday March 05 2020 22:07:03 EST Ventricular Rate:  80 PR Interval:    QRS Duration: 76 QT Interval:  431 QTC Calculation: 498 R Axis:   -78 Text Interpretation: Atrial fibrillation Ventricular premature complex Left anterior fascicular block Low voltage, precordial leads Nonspecific T abnormalities, anterior leads Borderline prolonged QT interval Confirmed by Sherwood Gambler [phone]) on 03/05/2020 11:06:19 PM   Radiology DG Chest 2 View  Result Date: 03/05/2020 CLINICAL DATA:  CHF, hypertension, lower extremity edema EXAM: CHEST - 2 VIEW COMPARISON:  11/02/2019 FINDINGS: Frontal and lateral views of the chest demonstrate a stable cardiac silhouette. There is chronic left basilar consolidation and small left pleural effusion. Stable area of scarring within the left lower lobe with fiduciary markers in place. Chronic background emphysema unchanged. No pneumothorax. No acute bony abnormalities. IMPRESSION: 1. Stable emphysema and left lung scarring. 2. Chronic left basilar consolidation and effusion. 3. No acute airspace disease. Electronically Signed   By: Randa Ngo M.D.   On: 03/05/2020 21:51  Procedures Procedures   Medications Ordered in ED Medications  potassium chloride SA (KLOR-CON) CR tablet 40 mEq (40 mEq Oral Given 03/05/20 2314)  furosemide (LASIX) injection 20 mg (20 mg Intravenous Given 03/05/20 2314)    ED Course  I have reviewed the triage vital signs and the nursing notes.  Pertinent labs & imaging results that were available during my care of the patient were reviewed by me and considered in my medical decision making (see chart for details).    MDM Rules/Calculators/A&P                          Patient is not having any respiratory symptoms.  She does appear to have peripheral edema and her BNP is higher than last time.  Given no respiratory symptoms I think she is stable for outpatient management.  The weeping is likely just due to the peripheral edema itself.  I will give her a dose of IV Lasix and increase her Lasix for the next 3 days.  We will give potassium along with this.  Otherwise follow-up with PCP and continue to wrap/supportive care to the legs. Final Clinical Impression(s) / ED Diagnoses Final diagnoses:  Bilateral lower extremity edema    Rx / DC Orders ED Discharge Orders         Ordered    potassium chloride SA (KLOR-CON) 20 MEQ tablet  Daily        03/05/20 2328           Sherwood Gambler, MD 03/05/20 2339

## 2020-03-06 NOTE — ED Notes (Signed)
Report called and given to [facility].

## 2020-03-07 ENCOUNTER — Other Ambulatory Visit: Payer: Self-pay | Admitting: Medical Oncology

## 2020-03-07 DIAGNOSIS — C3490 Malignant neoplasm of unspecified part of unspecified bronchus or lung: Secondary | ICD-10-CM

## 2020-03-15 ENCOUNTER — Non-Acute Institutional Stay: Payer: Self-pay | Admitting: Nurse Practitioner

## 2020-03-15 ENCOUNTER — Other Ambulatory Visit: Payer: Self-pay

## 2020-03-15 DIAGNOSIS — Z515 Encounter for palliative care: Secondary | ICD-10-CM
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
## 2020-03-19 ENCOUNTER — Other Ambulatory Visit: Payer: Self-pay

## 2020-03-19 ENCOUNTER — Non-Acute Institutional Stay: Payer: Medicare Other | Admitting: Nurse Practitioner

## 2020-03-19 DIAGNOSIS — Z515 Encounter for palliative care: Secondary | ICD-10-CM

## 2020-03-19 DIAGNOSIS — I509 Heart failure, unspecified: Secondary | ICD-10-CM

## 2020-03-19 NOTE — Progress Notes (Signed)
[facility] Consult Note Telephone: [phone]  Fax: [phone]  PATIENT NAME: Kathy Pierce [id number] [phone] (home)  DOB: Feb 19, 1925 MRN: [id number]  PRIMARY CARE PROVIDER:    London Pepper, MD,  [facility] [address] [address] [phone]  REFERRING PROVIDER:   London Pepper, MD [address] [place],  [place] [address] [phone]  RESPONSIBLE PARTY:   Extended Emergency Contact Information Primary Emergency Contact: Kathy Pierce [place] of [place] Phone: [phone] Relation: Daughter Secondary Emergency Contact: Kathy Pierce Address: [address] [place], [place] [address] Kathy Pierce of [place] Phone: [phone] Mobile Phone: [phone] Relation: Granddaughter  I met face to face with patient and daughter Kathy Pierce in facility.   ASSESSMENT AND RECOMMENDATIONS:   Advance Care Planning: Today's visit consisted of building trust and discussions on Palliative care medicine as a specialized medical care for people living with serious illness, aimed at facilitating improved quality of life through symptoms relief, assisting with advance care planning and establishing goals of care. Family expressed appreciation for education provided on Palliative care and how it differs from Hospice service. Palliative care will continue to provide support to patient, family and the medical team. Goal of care: Patient goal of care is comfort.  Directives: Patient reiterated desire to not be resuscitated in the event of  cardiac or respiratory arrest. She verbalized her belief on Jesus christ, saying she is ready and prepared to meet him. Patient has no DNR on file, DNR form signed and given to facility.  Symptom Management:  Depression: Patient denied depression. Report feeling well, saying last week episode was isolated episode,  report she was overwhelmed with everything going on around her that morning. Recommendation: Continue current plan of care, Zoloft 85m daily. Pain: denied pain today, denied pain control issues. Takes Tylenol as needed for pain, none recently.  Dyspena on exertion: DOE related to advance COPD oxygen dependent. Sleeps in chair, comfortable, does not want hospital bed.  Feel she sleeps welll in her recliner. Denied hemoptysis,  Denied fever or chills. Recommendation: Continue supportive care Continue Albuterol as needed. Monitor for evidence of exacerbation and treat promptly. Bilateral lower extremity edema: Patient started on addditional dose of Furosemide 280mdaily x 3 days and Potassium chloride 2057mdaily x 3 days. Patient report improvement in edema since starting additional dose of Lasix. Continue current plan of care with furosemide 85m53mily and supportive care. Continue skin care with home health nursing. Continue to monitor weights per facility protocol. Monitor renal function.  Follow up Palliative Care Visit: Palliative care will continue to follow for complex decision making and symptom management. Return in about 6-8 weeks or prn.  Family /Caregiver/Community Supports: Patient lives in an Assisted living facility, receives [facility].  Cognitive / Functional decline:  Patient awake, alert, and coherent. She is able to make her own decisions, requires moderate assist with her ADLs, wheelchair dependent for ambulation, unable to wheel self.  . I spent 48 minutes providing this consultation, time includes time spent with patient and daughter, chart review, provider coordination, and documentation. More than 50% of the time in this consultation was spent counseling and coordinating communication.   CHIEF COMPLAINT: Lower extremity edema  History obtained from review of EMR, discussion with facility staff, and interview with family and patient. Records  reviewed and summarized  bellow.  HISTORY OF PRESENT ILLNESS:  Kathy Pierce is a 87 y.o. year old female with multiple medical problems including CHF (EF 60-65%), COPD oxygen dependent, lung cancer, macular degeneration, Osteoporosis, spinal stenosis, vit B12 deficiency, depression, HTN. Palliative Care was asked to follow this patient by consultation request of Kathy Pepper, MD to help address advance care planning and goals of care.  CODE STATUS: DNR  PPS: 40%  HOSPICE ELIGIBILITY/DIAGNOSIS: TBD  ROS   General: NAD EYES: wears glasses at times ENMT: denied dysphagia Cardiovascular: denied chest pain, denied palpitation Pulmonary: denied acute cough, denied increased SOB Abdomen: endorses good appetite, denied constipation, denied continence of bowel GU: denied dysuria, denies continence of urine MSK:  endorses ROM limitations, no falls reported Skin: weeping lower legs, right more than left. Skin thin and fragile. Neurological: endorses weakness, denies pain, denies insomnia Psych: Endorses positive mood Heme/lymph/immuno: denies bruises, abnormal bleeding   Physical Exam:  Vital signs: BP 128/62, P 70, 94% on 3L oxygen via nasal canula Constitutional: NAD General: frail appearing, thin EYES: anicteric sclera, lids intact, no discharge  ENMT: mild hard of hearing, oral mucous membranes moist CV:  +3 bilateral lower extremity edema, legs wrapped on ace bandage Pulmonary: no increased work of breathing, no acute cough, no audible wheezes Abdomen: no ascites GU: deferred MSK:  decreased ROM in all extremities, no contractures of LE, non ambulatory Skin: warm and dry Neuro: Generalized weakness, no focal deficit Psych: non-anxious affect today, A and O x 3 Hem/lymph/immuno: no widespread bruising  Labs 03/05/2020 K: 3.4; BUN 12; Cr 0.53; GFR >60    PAST MEDICAL HISTORY:  Past Medical History:  Diagnosis Date  . Cancer (HCC)    Squamous cell carcinoma, skin, basal cell  carcinoma  . Complication of anesthesia    wakes up slowly   . COPD (chronic obstructive pulmonary disease) ([facility])   . Depression   . Dyspnea   . Essential thrombocythemia ([facility])   . Hypertension   . Lung cancer ([facility]) dx'd 2017  . Osteoporosis   . Sciatica of left side 06/16/2012  . Spinal stenosis of lumbar region 06/16/2012  . Vitamin B12 deficiency     SOCIAL HX:  Social History   Tobacco Use  . Smoking status: Former Smoker    Packs/day: 1.00    Years: 21.00    Pack years: 21.00    Types: Cigarettes    Quit date: 07/10/1976    Years since quitting: 43.7  . Smokeless tobacco: Never Used  Substance Use Topics  . Alcohol use: No   FAMILY HX:  Family History  Problem Relation Age of Onset  . Heart attack Father   . Cancer Mother   . Cancer Sister   . Cancer Brother   . Cancer Brother   . Cancer Daughter     ALLERGIES:  Allergies  Allergen Reactions  . Penicillins Hives and Rash    Did it involve swelling of the face/tongue/throat, SOB, or low BP? N Did it involve sudden or severe rash/hives, skin peeling, or any reaction on the inside of your mouth or nose? Y Did you need to seek medical attention at a hospital or doctor's office? N When did it last happen?Over 59 Years Ago If all above answers are "NO", may proceed with cephalosporin use.    . Codeine Nausea And Vomiting  . Erythromycin Nausea And Vomiting  . Sulfa Drugs Cross Reactors Nausea And Vomiting     PERTINENT MEDICATIONS:  Outpatient Encounter Medications  as of 03/19/2020  Medication Sig  . acetaminophen (TYLENOL) 325 MG tablet Take 2 tablets (650 mg total) by mouth every 6 (six) hours as needed. (Patient taking differently: Take 650 mg by mouth every 6 (six) hours as needed for moderate pain.)  . albuterol (PROVENTIL HFA;VENTOLIN HFA) 108 (90 Base) MCG/ACT inhaler Inhale 2 puffs into the lungs every 6 (six) hours as needed for wheezing or shortness of breath.  Marland Kitchen aspirin 81 MG tablet Take 81 mg  by mouth daily.  Marland Kitchen atenolol (TENORMIN) 25 MG tablet Take 25 mg by mouth daily.   . bimatoprost (LUMIGAN) 0.01 % SOLN Place 1 drop into both eyes at bedtime.  . calcium carbonate (TUMS - DOSED IN MG ELEMENTAL CALCIUM) 500 MG chewable tablet Chew 1 tablet by mouth daily.  . cyanocobalamin (,VITAMIN B-12,) 1000 MCG/ML injection Inject 1,000 mcg into the muscle every 30 (thirty) days.  Marland Kitchen docusate sodium (COLACE) 100 MG capsule Take 1 capsule (100 mg total) by mouth 2 (two) times daily.  . ferrous sulfate 325 (65 FE) MG tablet Take 325 mg by mouth daily.  . folic acid (FOLVITE) 1 MG tablet Take 1 tablet (1 mg total) by mouth daily.  . furosemide (LASIX) 20 MG tablet Take 1 tablet (20 mg total) by mouth daily.  . hydroxyurea (HYDREA) 500 MG capsule Take 500-1,000 mg by mouth every other day. Take 1 capsule (500 mg) and Take 2 capsules (1000 mg) on alternate days  . levothyroxine (SYNTHROID) 25 MCG tablet Take 25 mcg by mouth daily.  . meclizine (ANTIVERT) 25 MG tablet Take 25 mg by mouth 3 (three) times daily.  . Multiple Vitamins-Minerals (PRESERVISION AREDS PO) Take 1 tablet by mouth daily.  Marland Kitchen omeprazole (PRILOSEC) 20 MG capsule Take 20 mg by mouth daily.  Marland Kitchen oxyCODONE (OXY IR/ROXICODONE) 5 MG immediate release tablet Take 1/2 to 1 pill every 6 hrs as needed for pain (Patient not taking: Reported on 03/05/2020)  . OXYGEN Inhale 3 L into the lungs continuous.  . polyvinyl alcohol (LIQUIFILM TEARS) 1.4 % ophthalmic solution Place 1 drop into both eyes 3 (three) times daily.  . potassium chloride SA (KLOR-CON) 20 MEQ tablet Take 1 tablet (20 mEq total) by mouth daily.  . sertraline (ZOLOFT) 50 MG tablet Take 50 mg by mouth daily.  Marland Kitchen umeclidinium-vilanterol (ANORO ELLIPTA) 62.5-25 MCG/INH AEPB Inhale 1 puff into the lungs daily.   No facility-administered encounter medications on file as of 03/19/2020.    Thank you for the opportunity to participate in the care of Ms. Clydene Pugh. The palliative care  team will continue to follow. Please call our office at [phone] if we can be of additional assistance.  Jari Favre, DNP, AGPCNP-BC

## 2020-03-22 DIAGNOSIS — L97822 Non-pressure chronic ulcer of other part of left lower leg with fat layer exposed: Secondary | ICD-10-CM | POA: Diagnosis not present

## 2020-03-22 DIAGNOSIS — J449 Chronic obstructive pulmonary disease, unspecified: Secondary | ICD-10-CM | POA: Diagnosis not present

## 2020-03-22 DIAGNOSIS — M48061 Spinal stenosis, lumbar region without neurogenic claudication: Secondary | ICD-10-CM | POA: Diagnosis not present

## 2020-03-22 DIAGNOSIS — E538 Deficiency of other specified B group vitamins: Secondary | ICD-10-CM | POA: Diagnosis not present

## 2020-03-22 DIAGNOSIS — J9610 Chronic respiratory failure, unspecified whether with hypoxia or hypercapnia: Secondary | ICD-10-CM | POA: Diagnosis not present

## 2020-03-22 DIAGNOSIS — F329 Major depressive disorder, single episode, unspecified: Secondary | ICD-10-CM | POA: Diagnosis not present

## 2020-03-22 DIAGNOSIS — D649 Anemia, unspecified: Secondary | ICD-10-CM | POA: Diagnosis not present

## 2020-03-22 DIAGNOSIS — M810 Age-related osteoporosis without current pathological fracture: Secondary | ICD-10-CM | POA: Diagnosis not present

## 2020-03-22 DIAGNOSIS — I5032 Chronic diastolic (congestive) heart failure: Secondary | ICD-10-CM | POA: Diagnosis not present

## 2020-03-22 DIAGNOSIS — H9190 Unspecified hearing loss, unspecified ear: Secondary | ICD-10-CM | POA: Diagnosis not present

## 2020-03-22 DIAGNOSIS — K5900 Constipation, unspecified: Secondary | ICD-10-CM | POA: Diagnosis not present

## 2020-03-22 DIAGNOSIS — I110 Hypertensive heart disease with heart failure: Secondary | ICD-10-CM | POA: Diagnosis not present

## 2020-03-22 DIAGNOSIS — D473 Essential (hemorrhagic) thrombocythemia: Secondary | ICD-10-CM | POA: Diagnosis not present

## 2020-03-22 DIAGNOSIS — I872 Venous insufficiency (chronic) (peripheral): Secondary | ICD-10-CM | POA: Diagnosis not present

## 2020-03-22 DIAGNOSIS — G250 Essential tremor: Secondary | ICD-10-CM | POA: Diagnosis not present

## 2020-03-22 DIAGNOSIS — I700 Atherosclerosis of aorta: Secondary | ICD-10-CM | POA: Diagnosis not present

## 2020-03-28 DIAGNOSIS — I872 Venous insufficiency (chronic) (peripheral): Secondary | ICD-10-CM | POA: Diagnosis not present

## 2020-03-28 DIAGNOSIS — L97909 Non-pressure chronic ulcer of unspecified part of unspecified lower leg with unspecified severity: Secondary | ICD-10-CM | POA: Diagnosis not present

## 2020-03-28 DIAGNOSIS — R627 Adult failure to thrive: Secondary | ICD-10-CM | POA: Diagnosis not present

## 2020-03-28 DIAGNOSIS — J449 Chronic obstructive pulmonary disease, unspecified: Secondary | ICD-10-CM | POA: Diagnosis not present

## 2020-04-27 DEATH — deceased

## 2020-12-09 ENCOUNTER — Encounter: Payer: Self-pay | Admitting: Cardiology

## 2021-04-20 IMAGING — CT CT HEAD W/O CM
3 series · 15 of 47 positions shown, 18 images · non-contrast
Comparison: None.

CLINICAL DATA: Fall

EXAM:
CT HEAD WITHOUT CONTRAST
TECHNIQUE: Contiguous axial images were obtained from the base of the skull
through the vertex without intravenous contrast.

[Series 3: head wo · axial · 0.44mm/px · z∈[-118,+7]mm · 9 of 30 slices shown, 12 images]
[im 3/30  brain]
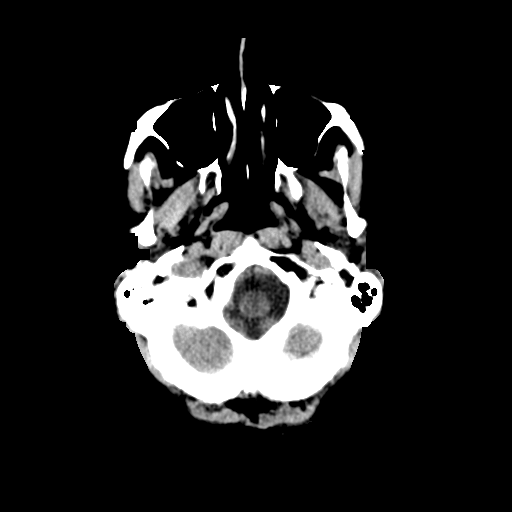
[im 3/30  bone]
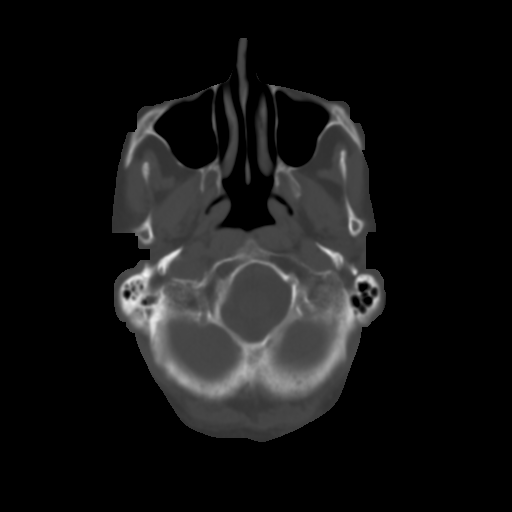
[im 6/30  brain]
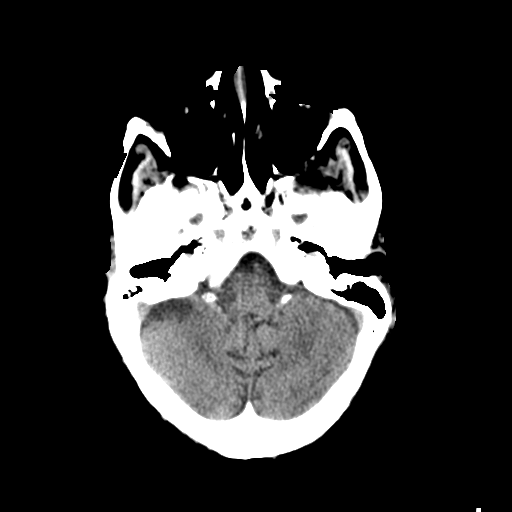
[im 9/30  brain]
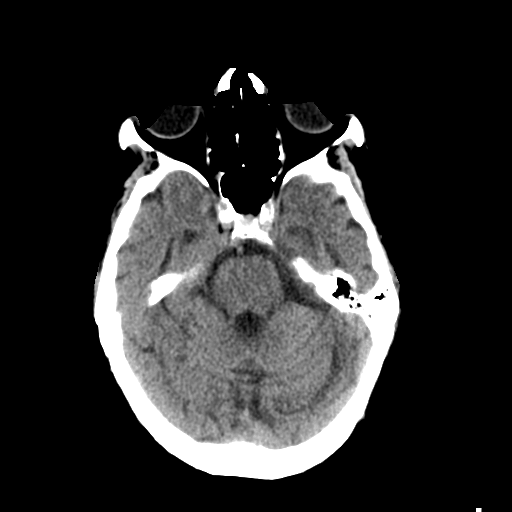
[im 12/30  brain]
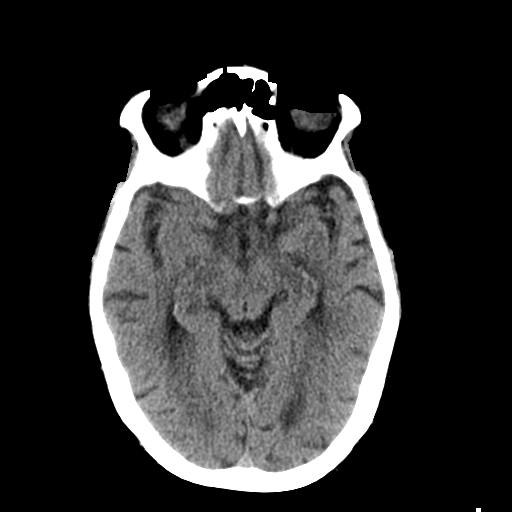
[im 16/30  brain]
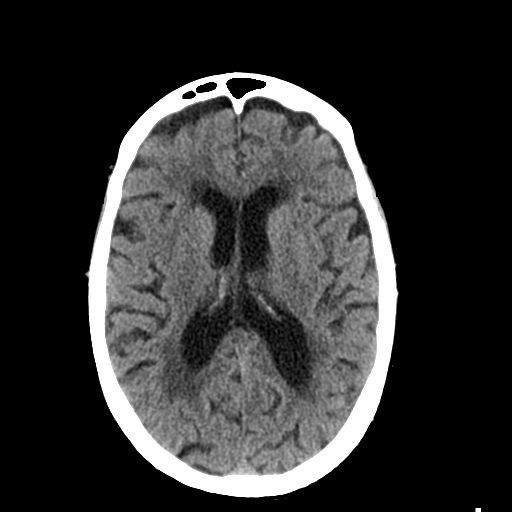
[im 16/30  bone]
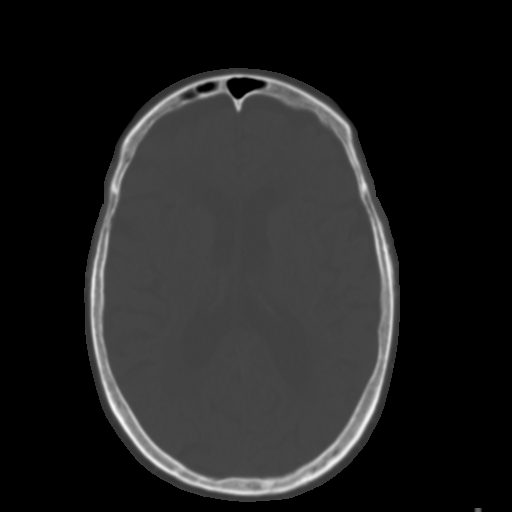
[im 19/30  brain]
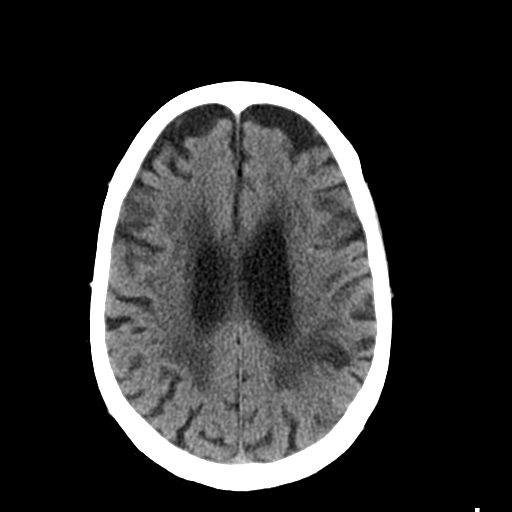
[im 22/30  brain]
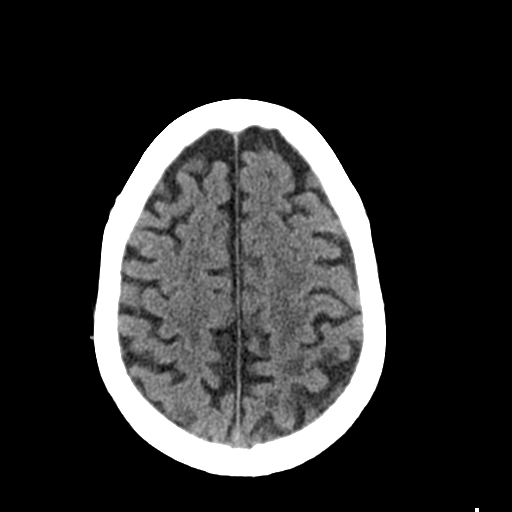
[im 25/30  brain]
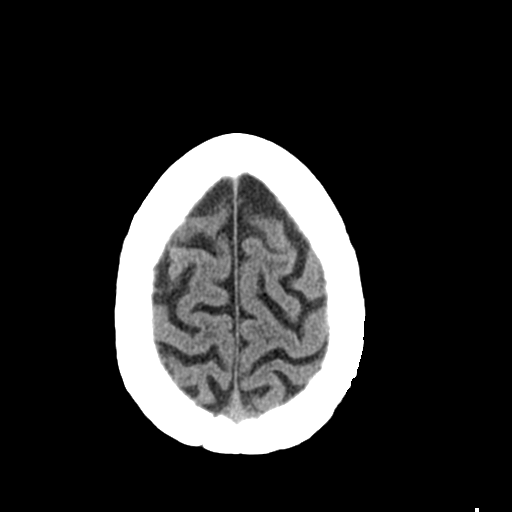
[im 28/30  brain]
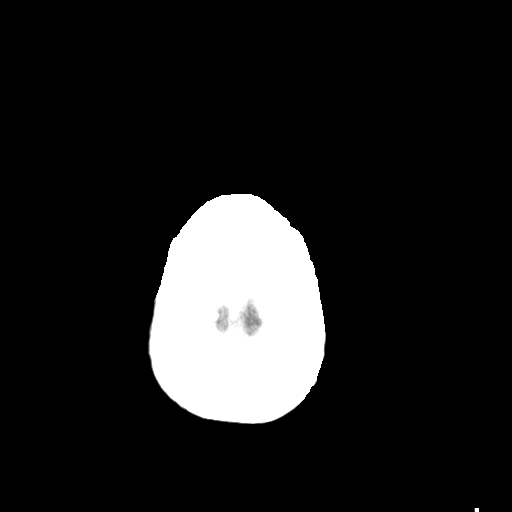
[im 28/30  bone]
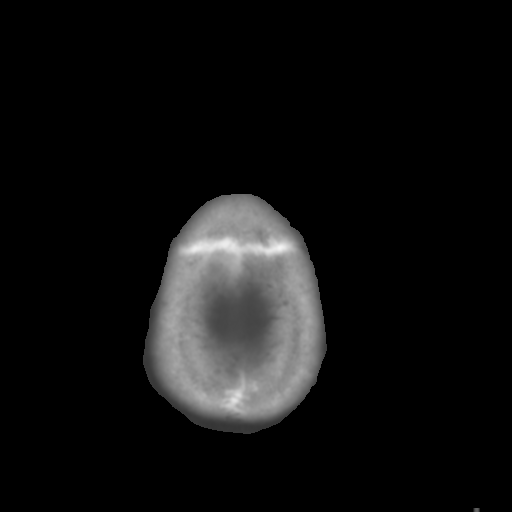

[Series 6: coronal soft tissue · coronal · 0.30mm/px · 3 of 66 slices shown]
[im 22/66  brain]
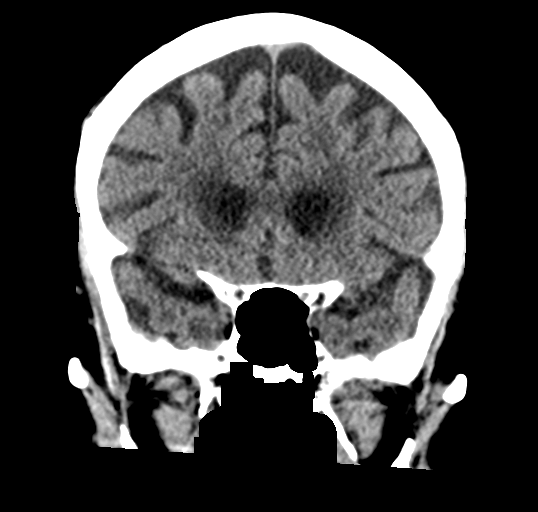
[im 29/66  brain]
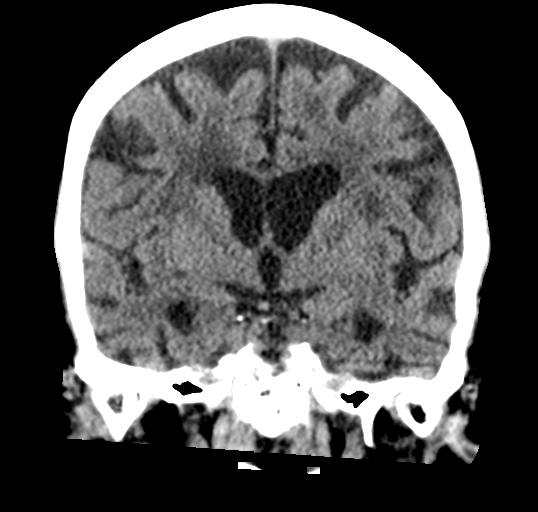
[im 37/66  brain]
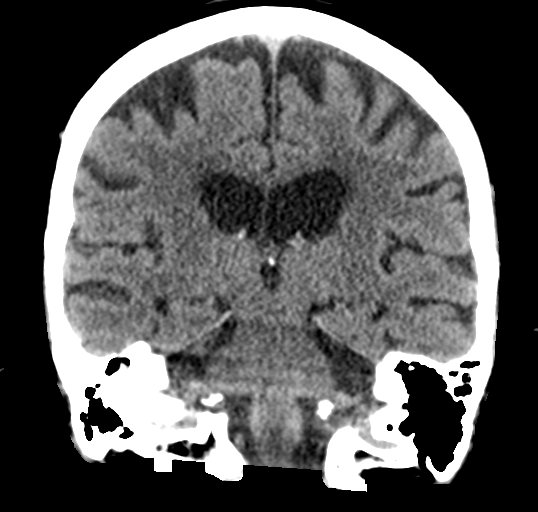

[Series 7: sagittal soft tissue · sagittal · 0.31mm/px · 3 of 49 slices shown]
[im 17/49  brain]
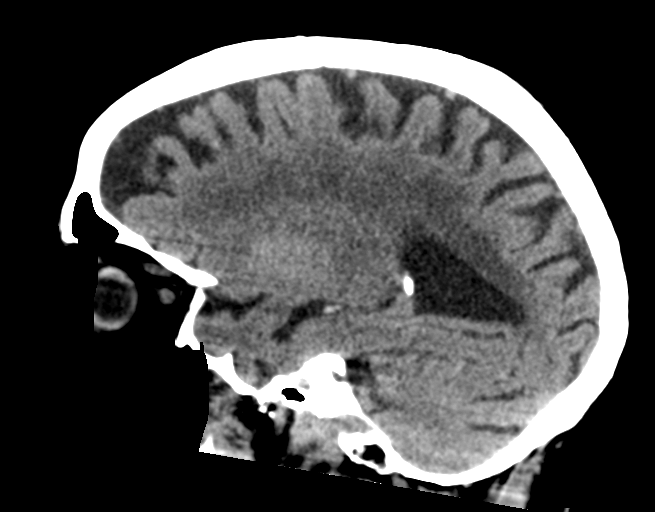
[im 25/49  brain]
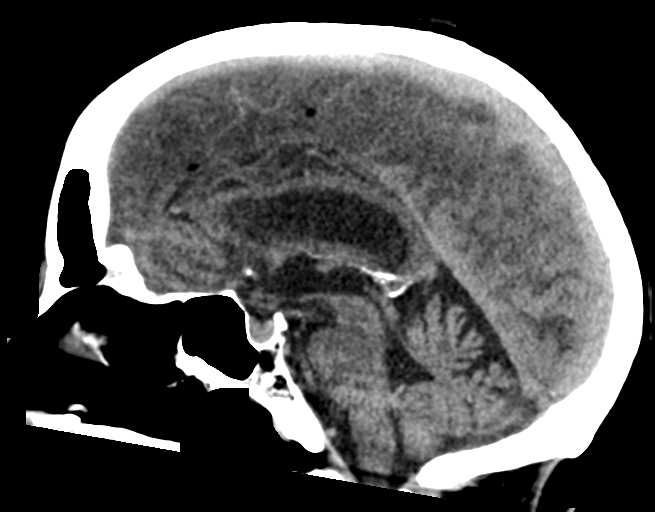
[im 33/49  brain]
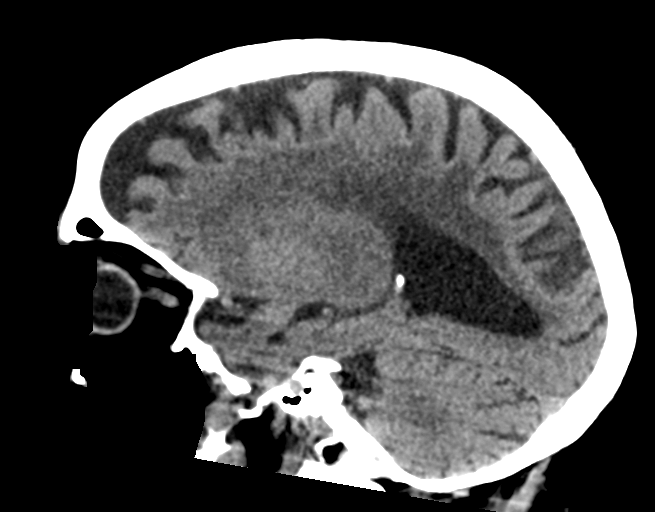

[15 of 47 positions shown; findings below may reference images not displayed]

FINDINGS: Brain: There is no acute intracranial hemorrhage, mass effect, or
edema. There is no acute appearing loss of gray-white
differentiation. Chronic left parietal infarct is present. Patchy
and confluent areas of hypoattenuation in the supratentorial white
matter are nonspecific but probably reflect moderate chronic
microvascular ischemic changes. There is no extra-axial fluid
collection. Prominence of the ventricles and sulci reflects mild
generalized parenchymal volume loss.

Vascular: There is atherosclerotic calcification at the skull base.

Skull: Calvarium is unremarkable.

Sinuses/Orbits: No acute finding.

Other: None.
IMPRESSION: No evidence of acute intracranial injury. Chronic/nonemergent
findings detailed above.

## 2021-11-13 IMAGING — CR DG CHEST 2V
2 series · 2 of 2 positions shown · non-contrast
Comparison: 11/02/2019

CLINICAL DATA: CHF, hypertension, lower extremity edema

EXAM:
CHEST - 2 VIEW

[w chest lat]
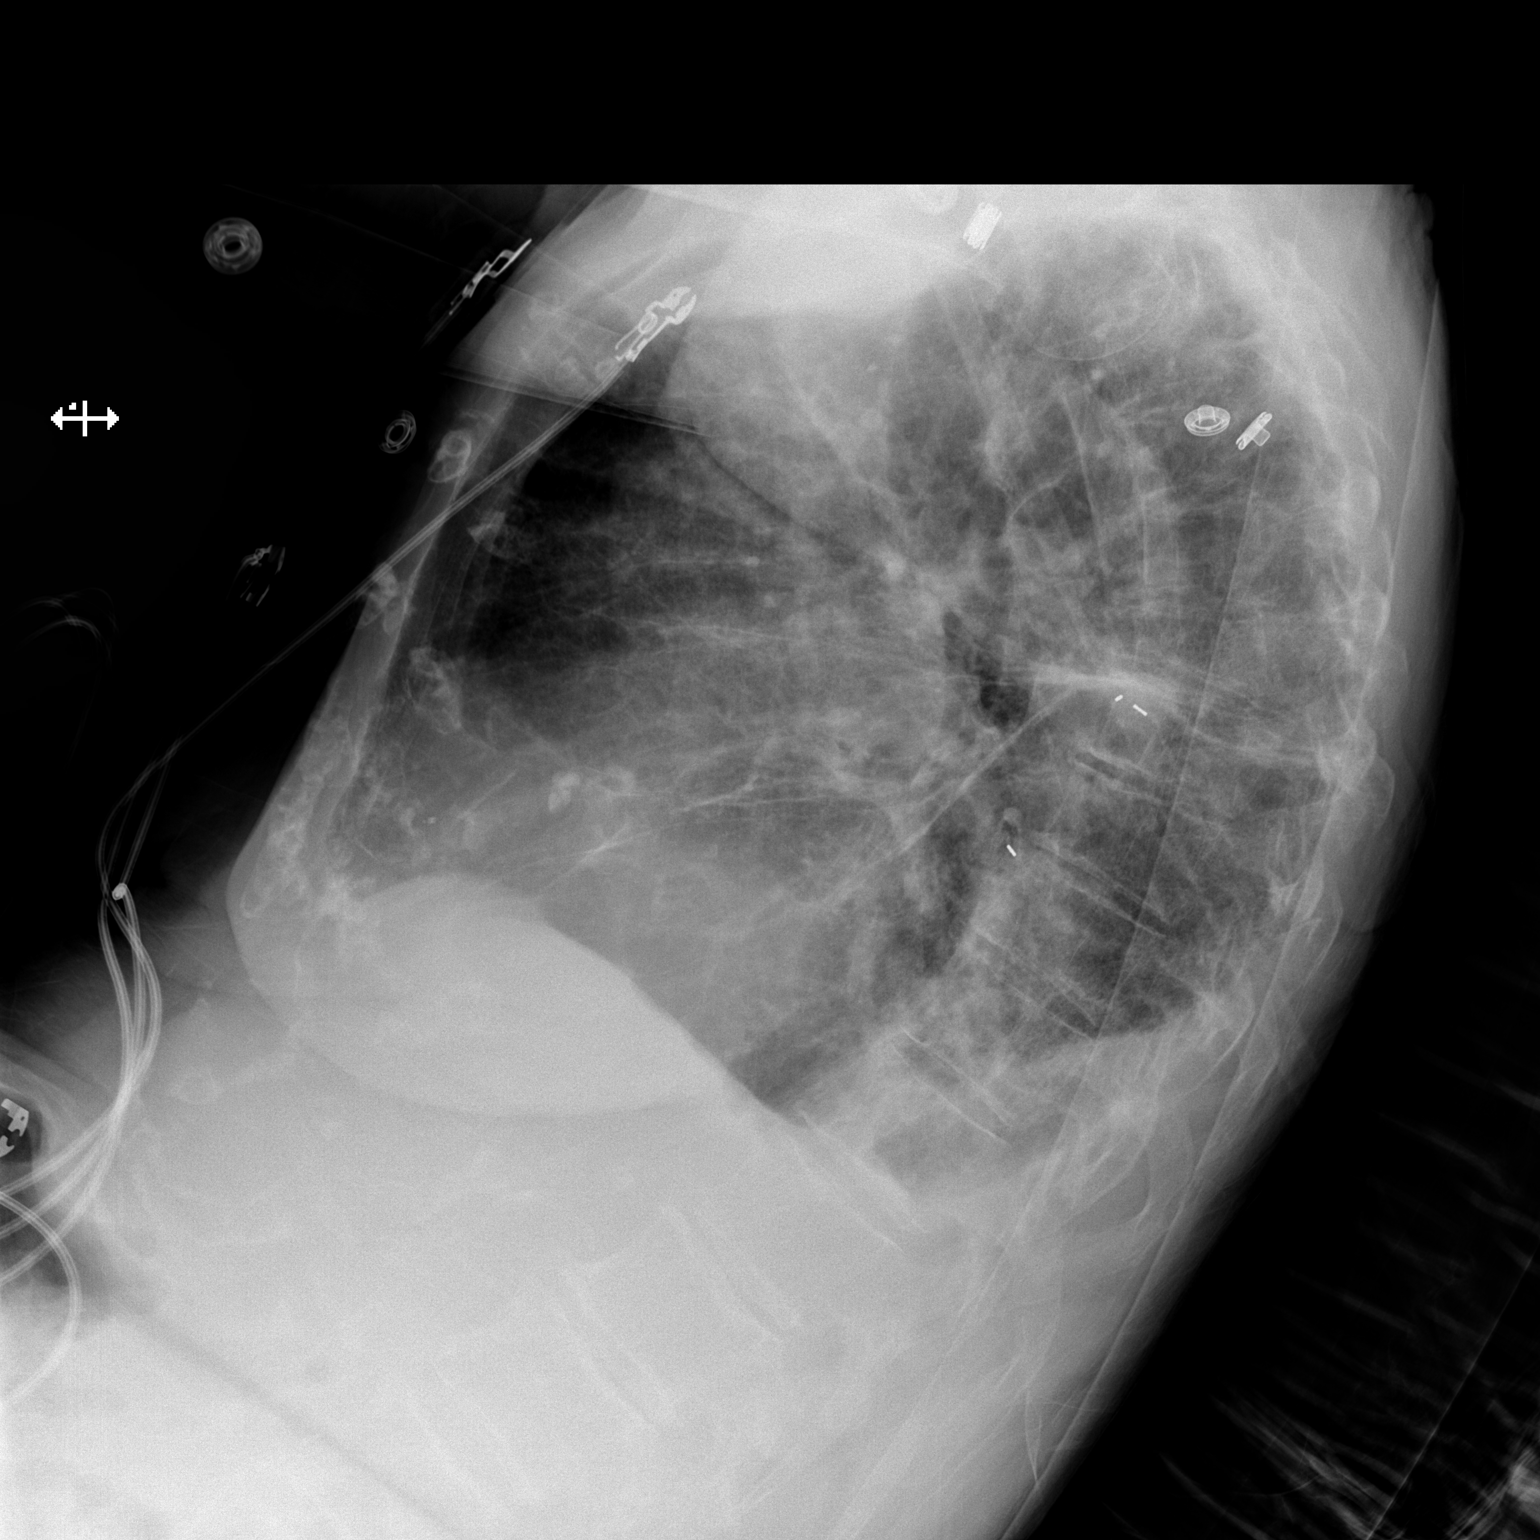

[x chest ap]
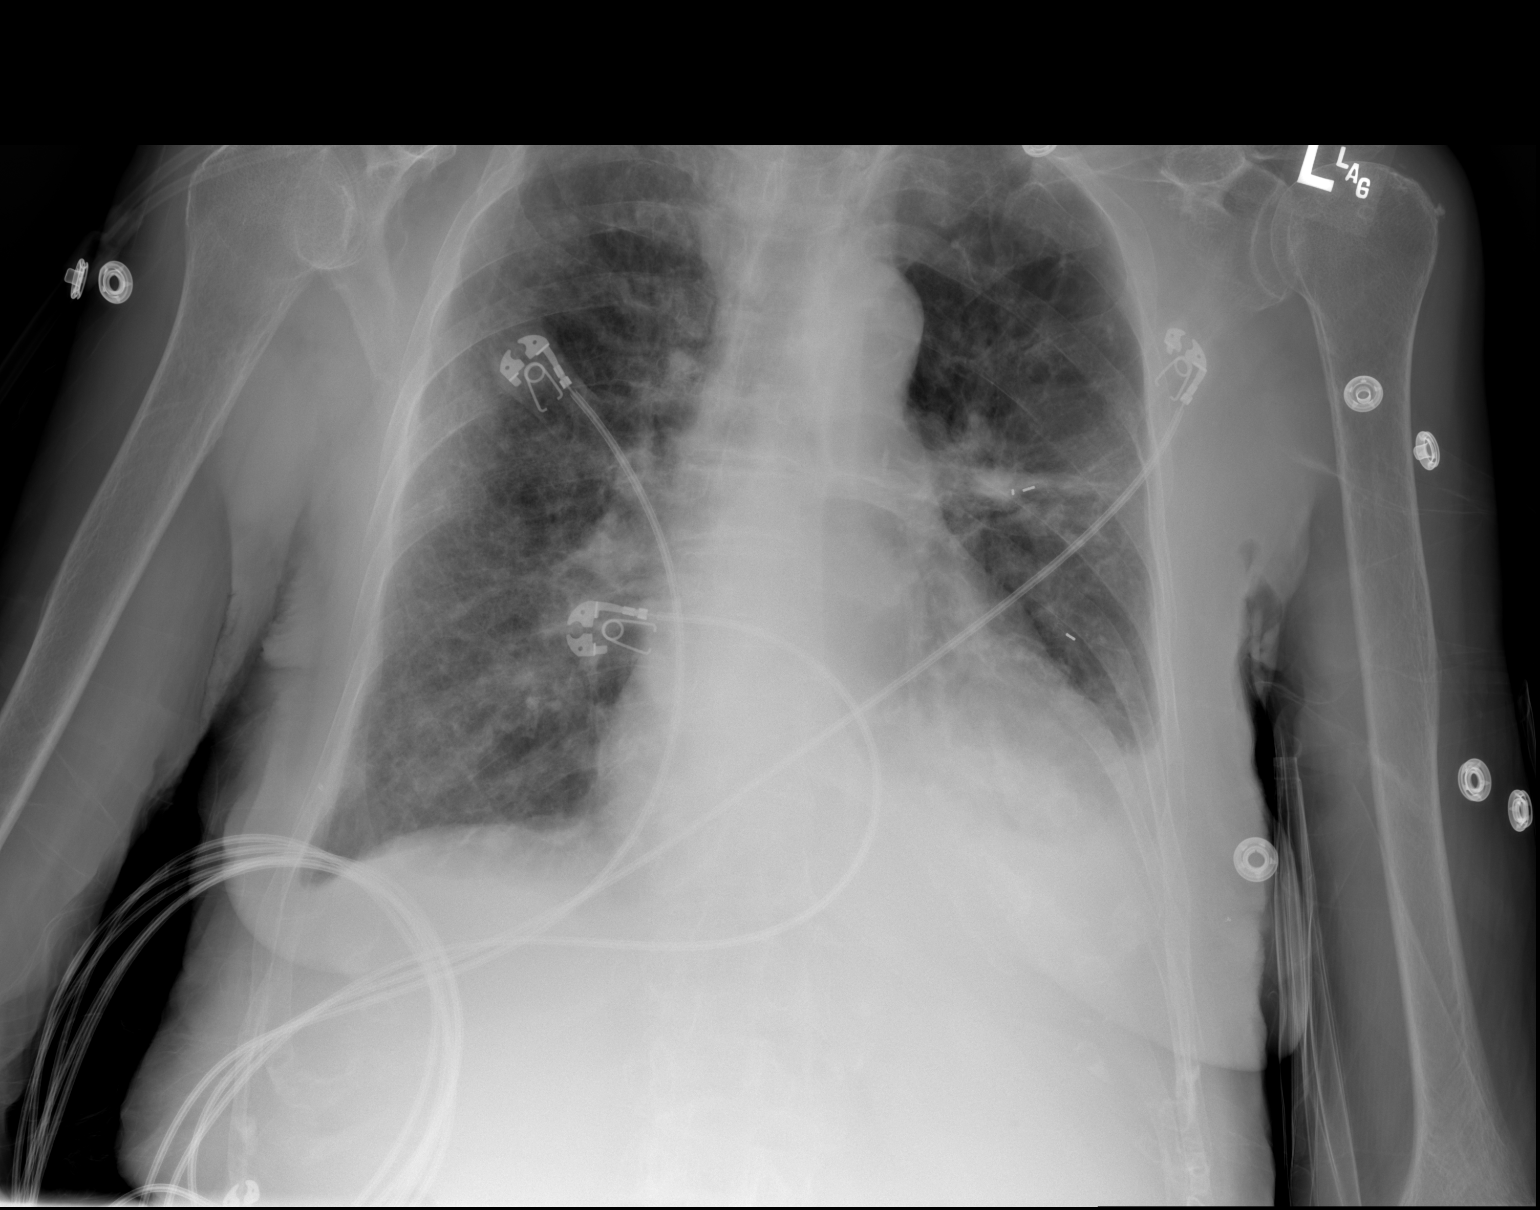

[2 of 2 positions shown; findings below may reference images not displayed]

FINDINGS: Frontal and lateral views of the chest demonstrate a stable cardiac
silhouette. There is chronic left basilar consolidation and small
left pleural effusion.

Stable area of scarring within the left lower lobe with fiduciary
markers in place. Chronic background emphysema unchanged. No
pneumothorax. No acute bony abnormalities.
IMPRESSION: 1. Stable emphysema and left lung scarring.
2. Chronic left basilar consolidation and effusion.
3. No acute airspace disease.
